# Patient Record
Sex: Female | Born: 1955 | Race: White | Hispanic: No | Marital: Single | Smoking: Never smoker
Health system: Southern US, Community
[De-identification: ages and names within clinical notes are randomized; demographics above are authoritative.]

## PROBLEM LIST (undated history)

## (undated) DIAGNOSIS — D649 Anemia, unspecified: Secondary | ICD-10-CM

## (undated) DIAGNOSIS — R7303 Prediabetes: Secondary | ICD-10-CM

## (undated) DIAGNOSIS — F39 Unspecified mood [affective] disorder: Secondary | ICD-10-CM

## (undated) DIAGNOSIS — Z85820 Personal history of malignant melanoma of skin: Secondary | ICD-10-CM

## (undated) DIAGNOSIS — N912 Amenorrhea, unspecified: Secondary | ICD-10-CM

## (undated) DIAGNOSIS — Z9889 Other specified postprocedural states: Secondary | ICD-10-CM

## (undated) DIAGNOSIS — T781XXA Other adverse food reactions, not elsewhere classified, initial encounter: Secondary | ICD-10-CM

## (undated) DIAGNOSIS — T7819XA Other adverse food reactions, not elsewhere classified, initial encounter: Secondary | ICD-10-CM

## (undated) DIAGNOSIS — H269 Unspecified cataract: Secondary | ICD-10-CM

## (undated) DIAGNOSIS — F32A Depression, unspecified: Secondary | ICD-10-CM

## (undated) DIAGNOSIS — F329 Major depressive disorder, single episode, unspecified: Secondary | ICD-10-CM

## (undated) DIAGNOSIS — R112 Nausea with vomiting, unspecified: Secondary | ICD-10-CM

## (undated) DIAGNOSIS — M1990 Unspecified osteoarthritis, unspecified site: Secondary | ICD-10-CM

## (undated) DIAGNOSIS — N63 Unspecified lump in unspecified breast: Secondary | ICD-10-CM

## (undated) DIAGNOSIS — C801 Malignant (primary) neoplasm, unspecified: Secondary | ICD-10-CM

## (undated) HISTORY — DX: Other adverse food reactions, not elsewhere classified, initial encounter: T78.1XXA

## (undated) HISTORY — PX: COLONOSCOPY: SHX174

## (undated) HISTORY — PX: OVARIAN CYST REMOVAL: SHX89

## (undated) HISTORY — DX: Unspecified lump in unspecified breast: N63.0

## (undated) HISTORY — PX: BUNIONECTOMY: SHX129

## (undated) HISTORY — PX: EYE SURGERY: SHX253

## (undated) HISTORY — PX: CERVICAL SPINE SURGERY: SHX589

## (undated) HISTORY — DX: Amenorrhea, unspecified: N91.2

## (undated) HISTORY — DX: Personal history of malignant melanoma of skin: Z85.820

## (undated) HISTORY — PX: BLEPHAROPLASTY: SUR158

## (undated) HISTORY — DX: Unspecified cataract: H26.9

## (undated) HISTORY — DX: Other adverse food reactions, not elsewhere classified, initial encounter: T78.19XA

## (undated) HISTORY — DX: Unspecified mood (affective) disorder: F39

## (undated) HISTORY — PX: TUMOR REMOVAL: SHX12

## (undated) HISTORY — DX: Anemia, unspecified: D64.9

---

## 1998-02-19 ENCOUNTER — Other Ambulatory Visit: Admission: RE | Admit: 1998-02-19 | Discharge: 1998-02-19 | Payer: Self-pay | Admitting: Obstetrics & Gynecology

## 1999-01-30 ENCOUNTER — Other Ambulatory Visit: Admission: RE | Admit: 1999-01-30 | Discharge: 1999-01-30 | Payer: Self-pay | Admitting: Obstetrics & Gynecology

## 2000-02-25 ENCOUNTER — Other Ambulatory Visit: Admission: RE | Admit: 2000-02-25 | Discharge: 2000-02-25 | Payer: Self-pay | Admitting: Obstetrics & Gynecology

## 2001-02-26 ENCOUNTER — Other Ambulatory Visit: Admission: RE | Admit: 2001-02-26 | Discharge: 2001-02-26 | Payer: Self-pay | Admitting: Obstetrics & Gynecology

## 2002-03-16 ENCOUNTER — Other Ambulatory Visit: Admission: RE | Admit: 2002-03-16 | Discharge: 2002-03-16 | Payer: Self-pay | Admitting: Obstetrics & Gynecology

## 2003-03-03 ENCOUNTER — Other Ambulatory Visit: Admission: RE | Admit: 2003-03-03 | Discharge: 2003-03-03 | Payer: Self-pay | Admitting: Obstetrics and Gynecology

## 2004-07-10 ENCOUNTER — Other Ambulatory Visit: Admission: RE | Admit: 2004-07-10 | Discharge: 2004-07-10 | Payer: Self-pay | Admitting: *Deleted

## 2004-07-15 ENCOUNTER — Ambulatory Visit: Payer: Self-pay | Admitting: Internal Medicine

## 2004-07-31 ENCOUNTER — Ambulatory Visit: Payer: Self-pay | Admitting: Internal Medicine

## 2005-01-09 ENCOUNTER — Emergency Department (HOSPITAL_COMMUNITY): Admission: EM | Admit: 2005-01-09 | Discharge: 2005-01-10 | Payer: Self-pay | Admitting: Emergency Medicine

## 2005-05-20 ENCOUNTER — Ambulatory Visit: Payer: Self-pay | Admitting: Internal Medicine

## 2005-09-30 ENCOUNTER — Ambulatory Visit: Payer: Self-pay | Admitting: Internal Medicine

## 2005-10-09 ENCOUNTER — Other Ambulatory Visit: Admission: RE | Admit: 2005-10-09 | Discharge: 2005-10-09 | Payer: Self-pay | Admitting: Obstetrics & Gynecology

## 2006-02-10 ENCOUNTER — Ambulatory Visit: Payer: Self-pay | Admitting: Internal Medicine

## 2006-02-17 ENCOUNTER — Ambulatory Visit: Payer: Self-pay | Admitting: Internal Medicine

## 2006-04-28 ENCOUNTER — Ambulatory Visit: Payer: Self-pay | Admitting: Internal Medicine

## 2006-05-18 ENCOUNTER — Ambulatory Visit: Payer: Self-pay | Admitting: Gastroenterology

## 2006-09-16 ENCOUNTER — Ambulatory Visit: Payer: Self-pay | Admitting: Internal Medicine

## 2006-09-26 DIAGNOSIS — N63 Unspecified lump in unspecified breast: Secondary | ICD-10-CM

## 2006-09-26 HISTORY — DX: Unspecified lump in unspecified breast: N63.0

## 2006-10-26 ENCOUNTER — Other Ambulatory Visit: Admission: RE | Admit: 2006-10-26 | Discharge: 2006-10-26 | Payer: Self-pay | Admitting: Obstetrics and Gynecology

## 2007-04-26 ENCOUNTER — Ambulatory Visit: Payer: Self-pay | Admitting: Internal Medicine

## 2007-04-26 DIAGNOSIS — M549 Dorsalgia, unspecified: Secondary | ICD-10-CM | POA: Insufficient documentation

## 2007-10-13 ENCOUNTER — Ambulatory Visit: Payer: Self-pay | Admitting: Internal Medicine

## 2007-10-13 DIAGNOSIS — B009 Herpesviral infection, unspecified: Secondary | ICD-10-CM | POA: Insufficient documentation

## 2007-10-13 DIAGNOSIS — G4700 Insomnia, unspecified: Secondary | ICD-10-CM | POA: Insufficient documentation

## 2007-11-02 ENCOUNTER — Telehealth: Payer: Self-pay | Admitting: *Deleted

## 2007-11-08 ENCOUNTER — Other Ambulatory Visit: Admission: RE | Admit: 2007-11-08 | Discharge: 2007-11-08 | Payer: Self-pay | Admitting: Obstetrics & Gynecology

## 2008-07-28 LAB — HM COLONOSCOPY: HM Colonoscopy: NORMAL

## 2008-09-01 ENCOUNTER — Encounter: Payer: Self-pay | Admitting: Internal Medicine

## 2008-09-08 ENCOUNTER — Ambulatory Visit: Payer: Self-pay | Admitting: Internal Medicine

## 2008-11-09 ENCOUNTER — Other Ambulatory Visit: Admission: RE | Admit: 2008-11-09 | Discharge: 2008-11-09 | Payer: Self-pay | Admitting: Obstetrics & Gynecology

## 2009-01-19 ENCOUNTER — Ambulatory Visit: Payer: Self-pay | Admitting: Internal Medicine

## 2009-01-19 LAB — CONVERTED CEMR LAB
ALT: 12 units/L (ref 0–35)
AST: 20 units/L (ref 0–37)
Alkaline Phosphatase: 42 units/L (ref 39–117)
BUN: 15 mg/dL (ref 6–23)
Bilirubin Urine: NEGATIVE
Bilirubin, Direct: 0.1 mg/dL (ref 0.0–0.3)
CO2: 29 meq/L (ref 19–32)
Calcium: 8.9 mg/dL (ref 8.4–10.5)
Creatinine, Ser: 0.9 mg/dL (ref 0.4–1.2)
Direct LDL: 134.2 mg/dL
Eosinophils Absolute: 0.1 10*3/uL (ref 0.0–0.7)
Glucose, Bld: 79 mg/dL (ref 70–99)
HCT: 40.6 % (ref 36.0–46.0)
Lymphs Abs: 2.1 10*3/uL (ref 0.7–4.0)
MCHC: 33.9 g/dL (ref 30.0–36.0)
MCV: 96.1 fL (ref 78.0–100.0)
Monocytes Absolute: 0.6 10*3/uL (ref 0.1–1.0)
Neutrophils Relative %: 49 % (ref 43.0–77.0)
Platelets: 211 10*3/uL (ref 150.0–400.0)
TSH: 1.03 microintl units/mL (ref 0.35–5.50)
Total Bilirubin: 0.9 mg/dL (ref 0.3–1.2)
Total CHOL/HDL Ratio: 3
Triglycerides: 63 mg/dL (ref 0.0–149.0)
Urobilinogen, UA: 0.2
WBC Urine, dipstick: NEGATIVE

## 2009-02-02 ENCOUNTER — Ambulatory Visit: Payer: Self-pay | Admitting: Internal Medicine

## 2009-02-02 LAB — CONVERTED CEMR LAB
Blood in Urine, dipstick: NEGATIVE
Glucose, Urine, Semiquant: NEGATIVE
Nitrite: NEGATIVE
Protein, U semiquant: NEGATIVE
Specific Gravity, Urine: 1.01
WBC Urine, dipstick: NEGATIVE
pH: 6

## 2009-03-14 ENCOUNTER — Ambulatory Visit: Payer: Self-pay | Admitting: Internal Medicine

## 2009-07-28 DIAGNOSIS — Z85820 Personal history of malignant melanoma of skin: Secondary | ICD-10-CM

## 2009-07-28 HISTORY — DX: Personal history of malignant melanoma of skin: Z85.820

## 2009-09-18 ENCOUNTER — Telehealth: Payer: Self-pay | Admitting: Internal Medicine

## 2009-11-06 ENCOUNTER — Telehealth: Payer: Self-pay | Admitting: *Deleted

## 2009-12-03 ENCOUNTER — Telehealth: Payer: Self-pay | Admitting: *Deleted

## 2009-12-21 ENCOUNTER — Ambulatory Visit: Payer: Self-pay | Admitting: Internal Medicine

## 2009-12-21 DIAGNOSIS — N959 Unspecified menopausal and perimenopausal disorder: Secondary | ICD-10-CM | POA: Insufficient documentation

## 2009-12-21 DIAGNOSIS — M79609 Pain in unspecified limb: Secondary | ICD-10-CM | POA: Insufficient documentation

## 2010-07-28 HISTORY — PX: MELANOMA EXCISION: SHX5266

## 2010-08-27 NOTE — Assessment & Plan Note (Signed)
Summary: follow on meds/ssc   Vital Signs:  Patient profile:   55 year old female Menstrual status:  regular Weight:      118 pounds BMI:     19.71 Temp:     98.8 degrees F oral Pulse rate:   74 / minute Resp:     12 per minute BP sitting:   110 / 80  Vitals Entered By: Lynann Beaver CMA (Dec 21, 2009 3:26 PM) CC: follow-up visit Is Patient Diabetic? No Pain Assessment Patient in pain? no        History of Present Illness: Kendra Hoffman  comesin for med check .    Last ov was august  2010 for dizziness.   Sleep  :  taking a 1/2  of ambien ususally  and then 1030  - 4 30  .    no sig se of med  would like not to take meds .but cant sleep without this and is now menopausal  Limited  time   off ocps   Increasing hand pain and swelling.    thumbs     are painful   at time  left  more than right.    considering seeing  rheumatologist.  She had seen Dr D in the remote past. Hasnt taken nsaid cause doesnt like to take meds!      no other joint problems   Preventive Screening-Counseling & Management  Alcohol-Tobacco     Alcohol drinks/day: <1     Alcohol type: wine     Smoking Status: never  Caffeine-Diet-Exercise     Caffeine use/day: 1     Does Patient Exercise: yes  Allergies: 1)  Sulfamethoxazole (Sulfamethoxazole)  Past History:  Past medical, surgical, family and social histories (including risk factors) reviewed for relevance to current acute and chronic problems.  Past Medical History: Reviewed history from 02/02/2009 and no changes required. Mood dysfuncton Anemia G1 Consults Dr. Gwendolyn Fill   April  Past Surgical History: Reviewed history from 02/02/2009 and no changes required. ovarian cyst removal bunion surgery right  Family History: Reviewed history from 02/02/2009 and no changes required. Family History of Alcoholism/Addiction Family History Diabetes 1st degree relative Family History Lung cancer Family History Other cancer-Colon Family  History of Cardiovascular disorder Family History of Arthritis  Father: 2 MIs   and DM Mother: macualr degeneration otherwise healthy  Siblings: Bro alcohol, sis  etoh,   sis  FM    brother  DVT  disabled and immobile  Nephew  28 had blood clot.   Social History: Reviewed history from 02/02/2009 and no changes required. Single Alcohol use-yes ocassional max 3 per week. Regular exercise-no HH of 1  pets 2 cats  Non smoker   Review of Systems  The patient denies anorexia, fever, weight loss, weight gain, vision loss, decreased hearing, prolonged cough, difficulty walking, unusual weight change, abnormal bleeding, enlarged lymph nodes, and angioedema.    Physical Exam  General:  Well-developed,well-nourished,in no acute distress; alert,appropriate and cooperative throughout examination Head:  normocephalic and atraumatic.   Eyes:  vision grossly intact, pupils equal, and pupils round.   Neck:  No deformities, masses, or tenderness noted. Lungs:  Normal respiratory effort, chest expands symmetrically. Lungs are clear to auscultation, no crackles or wheezes. Heart:  Normal rate and regular rhythm. S1 and S2 normal without gallop, murmur, click, rub or other extra sounds.no lifts.   Abdomen:  Bowel sounds positive,abdomen soft and non-tender without masses, organomegaly or  noted. Msk:  hand  with slight swelling    no crepitius or redness  of thumb  Pulses:  pulses intact without delay   Extremities:  no clubbing cyanosis or edema  Neurologic:  non focal  Skin:  turgor normal, color normal, no petechiae, and no purpura.   Cervical Nodes:  No lymphadenopathy noted Psych:  Oriented X3, good eye contact, not anxious appearing, and not depressed appearing.   loks a bit tired   Impression & Recommendations:  Problem # 1:  INSOMNIA (ICD-780.52) Discussed risk benefit    consider  CBT at some point to make her awar but ok to continue for now on med. Her updated medication list for  this problem includes:    Ambien 10 Mg Tabs (Zolpidem tartrate) .Marland Kitchen... 1/2 to 1 by mouth at bedtime.hs  Problem # 2:  PERIMENOPAUSAL SYNDROME (ICD-627.9) to see Dr Hyacinth Meeker soon   Problem # 3:  HAND PAIN, BILATERAL (ICD-729.5) Assessment: Deteriorated ? oa ? other   agree with  consult  but would try nsaid also...  Complete Medication List: 1)  Ambien 10 Mg Tabs (Zolpidem tartrate) .... 1/2 to 1 by mouth at bedtime.hs 2)  Valtrex 500 Mg Tabs (Valacyclovir hcl) .Marland Kitchen.. 1 by mouth two times a day as directed 3)  Fish Oil 1000 Mg Caps (Omega-3 fatty acids) .... One by mouth daily 4)  Vitamin D 400 Unit Caps (Cholecalciferol) .... One by mouth daily  Patient Instructions: 1)  consider    cognitive behavioral therapy   for sleep in the future . 2)  would consider taking  nsaid   for the thumb pain   before  seeing rheumatolgist . Prescriptions: AMBIEN 10 MG  TABS (ZOLPIDEM TARTRATE) 1/2 to 1 by mouth at bedtime.hs  #90 x 1   Entered and Authorized by:   Madelin Headings MD   Signed by:   Madelin Headings MD on 12/21/2009   Method used:   Print then Give to Patient   RxID:   [id number]  greater than 50% of visit spent in counseling  25 minutes

## 2010-08-27 NOTE — Progress Notes (Signed)
Summary: ? about refill  Phone Note Call from Patient Call back at Home Phone [phone]   Caller: Patient---live call Summary of Call: according to Ira Dougher, she stated that she had a refill for Zoldipem. According to [facility], she does not. Pt would like for [facility] to call them . Ref # [id number]. Initial call taken by: Warnell Forester,  Dec 03, 2009 1:02 PM  Follow-up for Phone Call        Spoke to pt and she schedule an appt for 5/27. She would like to have a 30 days supply called into [facility]. See phone note from last month. Follow-up by: Romualdo Bolk, CMA (AAMA),  Dec 03, 2009 2:42 PM    Prescriptions: AMBIEN 10 MG  TABS (ZOLPIDEM TARTRATE) 1/2 to 1 by mouth at bedtime.hs  #30 x 0   Entered by:   Romualdo Bolk, CMA (AAMA)   Authorized by:   Madelin Headings MD   Signed by:   Romualdo Bolk, CMA (AAMA) on 12/03/2009   Method used:   Telephoned to ...       [facility]* (retail)       [address] [place], [place]  [address]       Ph: [phone]       Fax: [phone]   RxID:   [id number]

## 2010-08-27 NOTE — Progress Notes (Signed)
Summary: REFILL REQ FOR MED  Phone Note Call from Patient   Caller: Patient [phone] Reason for Call: Refill Medication Summary of Call: Pt req refill RX for med: AMBIEN 10 MG  TABS (ZOLPIDEM TARTRATE) to be faxed to [facility] @ [phone]...Marland KitchenMarland KitchenMarland Kitchen Pt adv that she called [facility] and they instructed her to call her Physician because they are waiting on a fax from same so prescription can be filled.   Initial call taken by: Debbra Riding,  September 18, 2009 9:29 AM  Follow-up for Phone Call        ok 90 no refills Follow-up by: Madelin Headings MD,  September 18, 2009 12:24 PM    Prescriptions: AMBIEN 10 MG  TABS (ZOLPIDEM TARTRATE) 1/2 to 1 by mouth at bedtime.hs  #90 x 0   Entered by:   Raechel Ache, RN   Authorized by:   Madelin Headings MD   Signed by:   Raechel Ache, RN on 09/18/2009   Method used:   Print then Give to Patient   RxID:   [id number]  faxed to [facility].

## 2010-08-27 NOTE — Progress Notes (Signed)
Summary: ?ov  Phone Note Call from Patient Call back at Home Phone [phone]   Caller: Patient Call For: Madelin Headings MD Summary of Call: pt has one refill remaining on ambien does pt needs ov ? Initial call taken by: Heron Sabins,  November 06, 2009 12:35 PM  Follow-up for Phone Call        its been more than 6 months since last visit .  needs ROV  . can refill med #30  and ov before runs out. of med. Follow-up by: Madelin Headings MD,  November 06, 2009 5:20 PM  Additional Follow-up for Phone Call Additional follow up Details #1::        Left message on machine to call back to schedule a follow up appt. Rx wasn't called in. I left a message saying that if she does need a refill then she needs to call our office to let us know and we could refill until her appt. Additional Follow-up by: Romualdo Bolk, CMA (AAMA),  November 06, 2009 5:22 PM

## 2010-10-17 ENCOUNTER — Telehealth: Payer: Self-pay | Admitting: Internal Medicine

## 2010-10-17 NOTE — Telephone Encounter (Signed)
Pt is sch for med check appt on 11/25/10. Pt would like to get lab work done during this visit. Pt will do an 8 hr fast. Pls advise.

## 2010-10-18 NOTE — Telephone Encounter (Signed)
Pt needs cpx with labs. Pt aware and will come in fasting 8 hours prior to appt. Appt scheduled.

## 2010-11-07 ENCOUNTER — Encounter: Payer: Self-pay | Admitting: Internal Medicine

## 2010-11-18 ENCOUNTER — Ambulatory Visit (INDEPENDENT_AMBULATORY_CARE_PROVIDER_SITE_OTHER): Payer: BC Managed Care – PPO | Admitting: Internal Medicine

## 2010-11-18 ENCOUNTER — Encounter: Payer: Self-pay | Admitting: Internal Medicine

## 2010-11-18 VITALS — BP 100/60 | HR 78 | Ht 64.0 in | Wt 118.0 lb

## 2010-11-18 DIAGNOSIS — Z85820 Personal history of malignant melanoma of skin: Secondary | ICD-10-CM

## 2010-11-18 DIAGNOSIS — G4700 Insomnia, unspecified: Secondary | ICD-10-CM

## 2010-11-18 DIAGNOSIS — B009 Herpesviral infection, unspecified: Secondary | ICD-10-CM

## 2010-11-18 DIAGNOSIS — Z0110 Encounter for examination of ears and hearing without abnormal findings: Secondary | ICD-10-CM

## 2010-11-18 DIAGNOSIS — N959 Unspecified menopausal and perimenopausal disorder: Secondary | ICD-10-CM

## 2010-11-18 DIAGNOSIS — Z0000 Encounter for general adult medical examination without abnormal findings: Secondary | ICD-10-CM | POA: Insufficient documentation

## 2010-11-18 DIAGNOSIS — Z23 Encounter for immunization: Secondary | ICD-10-CM

## 2010-11-18 LAB — CBC WITH DIFFERENTIAL/PLATELET
Basophils Absolute: 0 10*3/uL (ref 0.0–0.1)
Basophils Relative: 0.1 % (ref 0.0–3.0)
Eosinophils Relative: 0.9 % (ref 0.0–5.0)
Hemoglobin: 13.5 g/dL (ref 12.0–15.0)
Lymphocytes Relative: 37.3 % (ref 12.0–46.0)
Monocytes Relative: 9 % (ref 3.0–12.0)
Neutro Abs: 3.3 10*3/uL (ref 1.4–7.7)
RBC: 3.99 Mil/uL (ref 3.87–5.11)
WBC: 6.2 10*3/uL (ref 4.5–10.5)

## 2010-11-18 LAB — BASIC METABOLIC PANEL
Chloride: 98 mEq/L (ref 96–112)
GFR: 67.3 mL/min (ref >60.00)
Potassium: 3.7 mEq/L (ref 3.5–5.1)
Sodium: 139 mEq/L (ref 135–145)

## 2010-11-18 LAB — HEPATIC FUNCTION PANEL
ALT: 12 U/L (ref 0–35)
AST: 19 U/L (ref 0–37)
Alkaline Phosphatase: 53 U/L (ref 39–117)
Total Bilirubin: 0.7 mg/dL (ref 0.3–1.2)

## 2010-11-18 LAB — LIPID PANEL
LDL Cholesterol: 96 mg/dL (ref 0–99)
VLDL: 6 mg/dL (ref 0.0–40.0)

## 2010-11-18 LAB — POCT URINALYSIS DIPSTICK
Leukocytes, UA: NEGATIVE
Nitrite, UA: NEGATIVE
Protein, UA: NEGATIVE

## 2010-11-18 LAB — TSH: TSH: 0.95 u[IU]/mL (ref 0.35–5.50)

## 2010-11-18 MED ORDER — ZOLPIDEM TARTRATE 10 MG PO TABS: 5.0000 mg | ORAL_TABLET | Freq: Every evening | ORAL | Status: DC | PRN

## 2010-11-18 MED ORDER — VALACYCLOVIR HCL 500 MG PO TABS: 500.0000 mg | ORAL_TABLET | Freq: Two times a day (BID) | ORAL | Status: DC

## 2010-11-18 NOTE — Patient Instructions (Signed)
Will notify you  of labs when available. Check up in   1 year  or as needed

## 2010-11-18 NOTE — Progress Notes (Signed)
  Subjective:    Patient ID: Kendra Hoffman, female    DOB: [birth year], 55 y.o.   MRN: [id number]  HPI Patient comes in for her yearly checkup. Also her medicine evaluation. 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 HRT : Helps back on pill for  Cost. Helps. Dr Thurnell Lose  No major injuries or hospitalizations.  Review of Systems Sleep  With ambien.. 7  Hours    hard to sleep without it Snore some no apnea some allergies mild  Moderate  And no wheezing. Otherwise ROS negative or noncontributory she has some chronic right breast lateral tenderness for years. Negative evaluations. It is a bit concerned about her hearing thinks it's down a little bit but no acute changes. Uses Valtrex as needed not very often for herpes.    Objective:   Physical Exam Physical Exam: Vital signs reviewed BJY:NWGN is a well-developed well-nourished alert cooperative  white female who appears her stated age in no acute distress.  HEENT: normocephalic  traumatic , Eyes: PERRL EOM's full, conjunctiva clear, Nares: paten,t no deformity discharge or tenderness., Ears: no deformity EAC's clear TMs with normal landmarks. Mouth: clear OP, no lesions, edema.  Moist mucous membranes. Dentition in adequate repair. NECK: supple without masses, thyromegaly or bruits. CHEST/PULM:  Clear to auscultation and percussion breath sounds equal no wheeze , rales or rhonchi. No chest wall deformities or tenderness. Breast: normal by inspection . No dimpling, discharge, masses, tenderness or discharge . Breasts are lumpy LN: no cervical axillary inguinal adenopathy CV: PMI is nondisplaced, S1 S2 no gallops, murmurs, rubs. Peripheral pulses are full without delay.No JVD .  ABDOMEN: Bowel sounds normal nontender  No guard or rebound, no hepato splenomegal no CVA tenderness.  No hernia. Extremtities:  No clubbing cyanosis or edema, no acute joint swelling  or redness no focal atrophy NEURO:  Oriented x3, cranial nerves 3-12 appear to be intact, no obvious focal weakness,gait within normal limits no abnormal reflexes or asymmetrical SKIN: No acute rashes normal turgor, color, no bruising or petechiae. Well-healed scar left lateral foot. Some changes throughout PSYCH: Oriented, good eye contact, no obvious depression anxiety, cognition and judgment appear normal. Oriented x 3 and no noted deficits in memory, attention, and speech.  GYNE: sees Dr Hyacinth Meeker.  Hearing screen   Some decrease 35 db  Right  in lower frequencies   Left lower frequencies.        Assessment & Plan:  Preventive Health Care Tdap given today we'll get labs today though she is not fasting. She is not high risk.  Hearing  Borderline  Should get full audiologic eval if  Problematic.  Insomnia  On going  Risk benefit of medication discussed. Perimenopause  On hrt HSV prn rare usage. Refill  Hx of melanoma

## 2010-11-20 ENCOUNTER — Encounter: Payer: Self-pay | Admitting: *Deleted

## 2010-11-25 ENCOUNTER — Ambulatory Visit: Payer: Self-pay | Admitting: Internal Medicine

## 2011-05-19 ENCOUNTER — Emergency Department (INDEPENDENT_AMBULATORY_CARE_PROVIDER_SITE_OTHER): Payer: BC Managed Care – PPO

## 2011-05-19 ENCOUNTER — Emergency Department (HOSPITAL_BASED_OUTPATIENT_CLINIC_OR_DEPARTMENT_OTHER)
Admission: EM | Admit: 2011-05-19 | Discharge: 2011-05-19 | Disposition: A | Discharge status: Home / Self Care | Payer: BC Managed Care – PPO | Attending: Emergency Medicine | Admitting: Emergency Medicine

## 2011-05-19 ENCOUNTER — Encounter (HOSPITAL_BASED_OUTPATIENT_CLINIC_OR_DEPARTMENT_OTHER): Payer: Self-pay | Admitting: *Deleted

## 2011-05-19 DIAGNOSIS — S62639A Displaced fracture of distal phalanx of unspecified finger, initial encounter for closed fracture: Secondary | ICD-10-CM | POA: Insufficient documentation

## 2011-05-19 DIAGNOSIS — Z79899 Other long term (current) drug therapy: Secondary | ICD-10-CM | POA: Insufficient documentation

## 2011-05-19 DIAGNOSIS — W268XXA Contact with other sharp object(s), not elsewhere classified, initial encounter: Secondary | ICD-10-CM | POA: Insufficient documentation

## 2011-05-19 DIAGNOSIS — F17200 Nicotine dependence, unspecified, uncomplicated: Secondary | ICD-10-CM | POA: Insufficient documentation

## 2011-05-19 DIAGNOSIS — M25549 Pain in joints of unspecified hand: Secondary | ICD-10-CM

## 2011-05-19 DIAGNOSIS — IMO0002 Reserved for concepts with insufficient information to code with codable children: Secondary | ICD-10-CM

## 2011-05-19 DIAGNOSIS — X58XXXA Exposure to other specified factors, initial encounter: Secondary | ICD-10-CM

## 2011-05-19 DIAGNOSIS — S61209A Unspecified open wound of unspecified finger without damage to nail, initial encounter: Secondary | ICD-10-CM | POA: Insufficient documentation

## 2011-05-19 MED ORDER — OXYCODONE-ACETAMINOPHEN 5-325 MG PO TABS: 1.0000 | ORAL_TABLET | ORAL | Status: AC | PRN

## 2011-05-19 MED ORDER — OXYCODONE-ACETAMINOPHEN 5-325 MG PO TABS
ORAL_TABLET | ORAL | Status: AC
  Administered 2011-05-19: 1 via ORAL
  Filled 2011-05-19: qty 1

## 2011-05-19 MED ORDER — OXYCODONE-ACETAMINOPHEN 5-325 MG PO TABS
1.0000 | ORAL_TABLET | Freq: Once | ORAL | Status: AC
  Administered 2011-05-19: 1 via ORAL

## 2011-05-19 MED ORDER — BUPIVACAINE HCL 0.5 % IJ SOLN
50.0000 mL | Freq: Once | INTRAMUSCULAR | Status: AC
  Administered 2011-05-19: 3 mL
  Filled 2011-05-19: qty 1

## 2011-05-19 MED ORDER — CEPHALEXIN 500 MG PO CAPS: 500.0000 mg | ORAL_CAPSULE | Freq: Four times a day (QID) | ORAL | Status: AC

## 2011-05-19 NOTE — ED Provider Notes (Addendum)
History     CSN: [id number] Arrival date & time: 05/19/2011  2:04 PM   First MD Initiated Contact with Patient 05/19/11 1406      Chief Complaint  Patient presents with  . Extremity Laceration    (Consider location/radiation/quality/duration/timing/severity/associated sxs/prior treatment) Patient is a 55 y.o. female presenting with skin laceration. The history is provided by the patient.  Laceration  The incident occurred less than 1 hour ago. The laceration is located on the right hand (Right 5th finger). The laceration is 1 cm in size. The laceration mechanism was a a metal edge. The pain is at a severity of 8/10. The pain is moderate. The pain has been constant since onset. She reports no foreign bodies present. Her tetanus status is UTD.    Past Medical History  Diagnosis Date  . Mood disorder   . Anemia   . Hx of melanoma of skin 2011    left foot     Past Surgical History  Procedure Date  . Ovarian cyst removal   . Bunionectomy     right  . Melanoma excision 2012    left foot     Family History  Problem Relation Age of Onset  . Macular degeneration Mother   . Hypertension Mother   . Diabetes Father   . Heart attack Father     x 2  . Heart disease Father   . Alcohol abuse Sister   . Alcohol abuse Brother   . Deep vein thrombosis Brother     disable and immoble also nephew  . Ovarian cancer Sister   . Lung cancer    . Colon cancer      History  Substance Use Topics  . Smoking status: Current Some Day Smoker  . Smokeless tobacco: Not on file   Comment: 3 times a year  . Alcohol Use: 1.8 oz/week    3 Glasses of wine per week    OB History    Grav Para Term Preterm Abortions TAB SAB Ect Mult Living                  Review of Systems  Constitutional: Negative.   Skin:       Laceration rt 5th finger    Allergies  Sulfamethoxazole  Home Medications   Current Outpatient Rx  Name Route Sig Dispense Refill  . CITALOPRAM HYDROBROMIDE 20 MG  PO TABS Oral Take 20 mg by mouth daily.      Marland Kitchen ESTRADIOL 0.05 MG/24HR TD PTTW Transdermal Place 1 patch onto the skin 2 (two) times a week. Replace on Sunday and Wednesday     . OMEGA-3 FATTY ACIDS 1000 MG PO CAPS Oral Take 2 g by mouth daily.      Marland Kitchen MEDROXYPROGESTERONE ACETATE 5 MG PO TABS Oral Take 5 mg by mouth daily.      Marland Kitchen ZOLPIDEM TARTRATE 10 MG PO TABS Oral Take 5 mg by mouth at bedtime as needed.      Marland Kitchen VITAMIN D 400 UNITS PO CAPS Oral Take 400 Units by mouth daily.      . NON FORMULARY  Estrogen/progesterone pills- Pt unsure of name     . VALACYCLOVIR HCL 500 MG PO TABS Oral Take 1 tablet (500 mg total) by mouth 2 (two) times daily. 60 tablet 1    BP 128/80  Pulse 68  Temp 97.2 F (36.2 C)  Resp 16  Ht 5\' 5"  (1.651 m)  Wt 120 lb (54.432 kg)  BMI 19.97 kg/m2  SpO2 100%  Physical Exam  Constitutional: She appears well-developed and well-nourished.  Skin:       Tip of the 5th right finger with multiple lacerations, macerated in appearance. Lateral fingernail laceration where nail is in place, bilateral lateral aspects. Nail cuticle intact. No bony abnormalities.     ED Course  LACERATION REPAIR Date/Time: 05/19/2011 4:00 PM Performed by: Langley Adie A Authorized by: Langley Adie A Consent: Verbal consent obtained. Consent given by: patient and spouse Patient understanding: patient states understanding of the procedure being performed Patient identity confirmed: verbally with patient Location: Right index finger. Laceration length: 1 cm Tendon involvement: none Nerve involvement: none Anesthesia: digital block Local anesthetic: bupivacaine 0.5% without epinephrine Patient sedated: no Preparation: Patient was prepped and draped in the usual sterile fashion. Irrigation solution: saline Irrigation method: syringe Amount of cleaning: standard Skin closure: 5-0 Prolene Number of sutures: 3 Technique: simple Approximation difficulty: complex Patient tolerance:  Patient tolerated the procedure well with no immediate complications.   (including critical care time)  Labs Reviewed - No data to display Dg Finger Little Right  05/19/2011  *RADIOLOGY REPORT*  Clinical Data: Laceration, pain  RIGHT LITTLE FINGER 2+V  Comparison: None.  Findings: There is a small cortical fracture along the medial aspect of the mid portion of the fifth distal phalanx.  There is overlying soft tissue irregularity.  IMPRESSION: Small nondisplaced cortical fracture at the fifth distal phalanx.  Original Report Authenticated By: Brandon Melnick, M.D.     No diagnosis found.    MDM          Rodena Medin, PA 05/19/11 1641  Rodena Medin, PA 06/02/11 4098  Rodena Medin, PA 06/11/11 1191

## 2011-05-19 NOTE — ED Provider Notes (Signed)
Medical screening examination/treatment/procedure(s) were performed by non-physician practitioner and as supervising physician I was immediately available for consultation/collaboration.   Zuriyah Shatz, MD 05/19/11 1759 

## 2011-05-19 NOTE — ED Notes (Signed)
Pt c/o laceration to right hand 5th finger x 15 mins ago with hedge trimmers

## 2011-06-03 NOTE — ED Provider Notes (Signed)
Medical screening examination/treatment/procedure(s) were performed by non-physician practitioner and as supervising physician I was immediately available for consultation/collaboration.  Lita Flynn, MD 06/03/11 1537 

## 2011-06-11 NOTE — ED Provider Notes (Signed)
Medical screening examination/treatment/procedure(s) were performed by non-physician practitioner and as supervising physician I was immediately available for consultation/collaboration.  Geoffery Lyons, MD 06/11/11 0700

## 2011-07-30 ENCOUNTER — Telehealth: Payer: Self-pay | Admitting: *Deleted

## 2011-07-30 NOTE — Telephone Encounter (Signed)
Zolpidem 10mg  pt takes 1/2 tab qhs prn  LOV 11/18/10 NOV none

## 2011-07-30 NOTE — Telephone Encounter (Signed)
Ok to refill x 1  

## 2011-07-31 MED ORDER — ZOLPIDEM TARTRATE 10 MG PO TABS: 5.0000 mg | ORAL_TABLET | Freq: Every evening | ORAL | Status: DC | PRN

## 2011-07-31 NOTE — Telephone Encounter (Signed)
Rx called in 

## 2011-10-08 ENCOUNTER — Telehealth: Payer: Self-pay | Admitting: *Deleted

## 2011-10-08 MED ORDER — ZOLPIDEM TARTRATE 10 MG PO TABS: 5.0000 mg | ORAL_TABLET | Freq: Every evening | ORAL | Status: DC | PRN

## 2011-10-08 NOTE — Telephone Encounter (Signed)
Refill on ambien 10mg  Last refill on 07/31/11 #30 LOV 11/18/10 NOV none

## 2011-10-08 NOTE — Telephone Encounter (Signed)
Rx called in 

## 2011-10-08 NOTE — Telephone Encounter (Signed)
Ok x 1  Needs ov before more refills if it has been a year.

## 2011-11-07 ENCOUNTER — Other Ambulatory Visit: Payer: Self-pay

## 2011-11-07 NOTE — Telephone Encounter (Signed)
Rx request for zolpidem 10 mg.  Rx last filled 10/08/11. Pt last seen 11/18/10.  Pls advise.

## 2011-11-07 NOTE — Telephone Encounter (Signed)
Due for yearly check.   Can refill 30 pills  This time .     Schedule OV before runs out of meds.

## 2011-11-10 MED ORDER — ZOLPIDEM TARTRATE 10 MG PO TABS: 5.0000 mg | ORAL_TABLET | Freq: Every evening | ORAL | Status: DC | PRN

## 2011-11-10 NOTE — Telephone Encounter (Signed)
Rx called in to pharmacy. 

## 2012-01-01 ENCOUNTER — Telehealth: Payer: Self-pay | Admitting: Internal Medicine

## 2012-01-01 NOTE — Telephone Encounter (Signed)
Called and spoke with pt and pt states this started 4 days ago.  Pt states it is a pink color when she wipes.  Pt states it is a mucus-like in her bowel.  Pt states it is diluted and pink on the toilet tissue.  Pt states she has a GI specialist.  Per Dr. Fabian Sharp advised pt to call her GI specialist and make aware of new symptoms.

## 2012-01-01 NOTE — Telephone Encounter (Signed)
Please contact pt. Called can wanted to drop off a stool sample because she is having blood in stool. I informed her she would need to see the doctor. Pt said she could not afford 2 office visits. Pt wanted to move physical lab up to next week. I asked pt if should would like for me to ask nurse ot we could move up her appt. Pt declined.

## 2012-01-26 ENCOUNTER — Encounter: Payer: Self-pay | Admitting: Internal Medicine

## 2012-02-04 ENCOUNTER — Other Ambulatory Visit (INDEPENDENT_AMBULATORY_CARE_PROVIDER_SITE_OTHER): Payer: BC Managed Care – PPO

## 2012-02-04 DIAGNOSIS — Z0000 Encounter for general adult medical examination without abnormal findings: Secondary | ICD-10-CM

## 2012-02-04 LAB — LDL CHOLESTEROL, DIRECT: Direct LDL: 104 mg/dL

## 2012-02-04 LAB — HEPATIC FUNCTION PANEL
ALT: 12 U/L (ref 0–35)
AST: 19 U/L (ref 0–37)
Total Bilirubin: 0.7 mg/dL (ref 0.3–1.2)

## 2012-02-04 LAB — CBC WITH DIFFERENTIAL/PLATELET
Basophils Relative: 0.1 % (ref 0.0–3.0)
Eosinophils Relative: 1.5 % (ref 0.0–5.0)
HCT: 38.3 % (ref 36.0–46.0)
Hemoglobin: 12.9 g/dL (ref 12.0–15.0)
Lymphs Abs: 2 10*3/uL (ref 0.7–4.0)
Monocytes Relative: 11.7 % (ref 3.0–12.0)
Platelets: 165 10*3/uL (ref 150.0–400.0)
RBC: 3.92 Mil/uL (ref 3.87–5.11)
WBC: 5.7 10*3/uL (ref 4.5–10.5)

## 2012-02-04 LAB — POCT URINALYSIS DIPSTICK
Glucose, UA: NEGATIVE
Nitrite, UA: NEGATIVE
Spec Grav, UA: 1.015
Urobilinogen, UA: 1

## 2012-02-04 LAB — LIPID PANEL
Total CHOL/HDL Ratio: 3
VLDL: 6.8 mg/dL (ref 0.0–40.0)

## 2012-02-04 LAB — BASIC METABOLIC PANEL
BUN: 17 mg/dL (ref 6–23)
GFR: 70.54 mL/min (ref >60.00)
Potassium: 3.9 mEq/L (ref 3.5–5.1)
Sodium: 139 mEq/L (ref 135–145)

## 2012-02-04 LAB — TSH: TSH: 1.17 u[IU]/mL (ref 0.35–5.50)

## 2012-02-05 ENCOUNTER — Telehealth: Payer: Self-pay | Admitting: Family Medicine

## 2012-02-05 NOTE — Telephone Encounter (Signed)
Last seen 11/25/10 F/U appt:  02/11/12  Please advise how many refills.  Thanks!!!

## 2012-02-05 NOTE — Telephone Encounter (Signed)
Refill x 1; 30 pills

## 2012-02-06 ENCOUNTER — Other Ambulatory Visit: Payer: Self-pay | Admitting: Family Medicine

## 2012-02-06 MED ORDER — ZOLPIDEM TARTRATE 10 MG PO TABS: 5.0000 mg | ORAL_TABLET | Freq: Every evening | ORAL | Status: DC | PRN

## 2012-02-06 NOTE — Telephone Encounter (Signed)
Left message on voicemail at [facility] to refill Ambien.

## 2012-02-11 ENCOUNTER — Encounter: Payer: Self-pay | Admitting: Internal Medicine

## 2012-02-11 ENCOUNTER — Ambulatory Visit (INDEPENDENT_AMBULATORY_CARE_PROVIDER_SITE_OTHER): Payer: BC Managed Care – PPO | Admitting: Internal Medicine

## 2012-02-11 VITALS — BP 132/74 | HR 71 | Temp 99.1°F | Ht 64.5 in | Wt 114.0 lb

## 2012-02-11 DIAGNOSIS — N959 Unspecified menopausal and perimenopausal disorder: Secondary | ICD-10-CM

## 2012-02-11 DIAGNOSIS — Z85820 Personal history of malignant melanoma of skin: Secondary | ICD-10-CM

## 2012-02-11 DIAGNOSIS — Z0000 Encounter for general adult medical examination without abnormal findings: Secondary | ICD-10-CM

## 2012-02-11 DIAGNOSIS — G4700 Insomnia, unspecified: Secondary | ICD-10-CM

## 2012-02-11 MED ORDER — ZOLPIDEM TARTRATE 10 MG PO TABS: 5.0000 mg | ORAL_TABLET | Freq: Every evening | ORAL | Status: DC | PRN

## 2012-02-11 NOTE — Progress Notes (Signed)
Subjective:    Patient ID: Kendra Hoffman, female    DOB: 18-Sep-1955, 56 y.o.   MRN: [id number]  HPI Patient comes in today for preventive visit and follow-up of medical issues. Update  history since  last visit: Her physical health has been okay although she is getting some hot flashes on hormone replacement per Dr. Hyacinth Meeker. She continues on Celexa 20 mg seems to keep things stabilize her family is going through great decline. Her his sister dying of ovarian cancer in her parents health declining. 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 They live in [place] she lives in [place] she has a lot of trouble there. She is working 2 jobs.  She uses Ambien as needed for sleep 5 mg and has been using it as needed in the past although has needed a bit more now. Has for refills sent in to the pharmacy.  Not using tobacco over 3 years in the past and had an occasional tobacco but never had any daily tobacco use. Her father did smoke and died of lung cancer.  She has a personal history of melanoma and sees the dermatologist on a routine basis for surveillance to recurrences. She is to get her Pap smear next month  Review of Systems ROS:  GEN/ HEENT: No fever, significant weight changes sweats headaches vision problems hearing changes, CV/ PULM; No chest pain shortness of breath cough, syncope,edema  change in exercise tolerance. GI /GU: No adominal pain, vomiting, change in bowel habits. No blood in the stool. No significant GU symptoms. SKIN/HEME: ,no acute skin rashes suspicious lesions or bleeding. No lymphadenopathy, nodules, masses.  NEURO/ PSYCH:  No neurologic signs such as weakness numbness. No depression anxiety. IMM/ Allergy: No unusual infections.  Allergy .  Does have occasional hot flushes. REST of 12 system review negative except as per HPI Outpatient Encounter Prescriptions as of 02/11/2012  Medication Sig Dispense Refill  . citalopram (CELEXA) 20 MG tablet Take 20  mg by mouth daily.        Marland Kitchen estradiol (VIVELLE-DOT) 0.05 MG/24HR Place 1 patch onto the skin 2 (two) times a week. Replace on Sunday and Wednesday       . fish oil-omega-3 fatty acids 1000 MG capsule Take 2 g by mouth daily.        . medroxyPROGESTERone (PROVERA) 5 MG tablet Take 5 mg by mouth daily.        . valACYclovir (VALTREX) 500 MG tablet Take 1 tablet (500 mg total) by mouth 2 (two) times daily.  60 tablet  1  . zolpidem (AMBIEN) 10 MG tablet Take 0.5 tablets (5 mg total) by mouth at bedtime as needed.  30 tablet  0  . DISCONTD: Cholecalciferol (VITAMIN D) 400 UNITS capsule Take 400 Units by mouth daily.        Marland Kitchen DISCONTD: NON FORMULARY Estrogen/progesterone pills- Pt unsure of name        Past history family history social history reviewed in the electronic medical record.     Objective:   Physical Exam BP 132/74  Pulse 71  Temp 99.1 F (37.3 C) (Oral)  Ht 5' 4.5" (1.638 m)  Wt 114 lb (51.71 kg)  BMI 19.27 kg/m2  SpO2 99% Physical Exam: Vital signs reviewed JYN:WGNF is a well-developed well-nourished alert cooperative  white female who appears her stated age in no acute distress.  HEENT: normocephalic atraumatic , Eyes: PERRL EOM's full, conjunctiva clear, Nares: paten,t no deformity discharge or tenderness., Ears: no  deformity EAC's clear TMs with normal landmarks. Mouth: clear OP, no lesions, edema.  Moist mucous membranes. Dentition in adequate repair. NECK: supple without masses, thyromegaly or bruits. CHEST/PULM:  Clear to auscultation and percussion breath sounds equal no wheeze , rales or rhonchi. No chest wall deformities or tenderness. CV: PMI is nondisplaced, S1 S2 no gallops, murmurs, rubs. Peripheral pulses are full without delay.No JVD .  ABDOMEN: Bowel sounds normal nontender  No guard or rebound, no hepato splenomegal no CVA tenderness.  No hernia. Breast: normal by inspection . No dimpling, discharge, masses, tenderness or discharge . Extremtities:  No clubbing  cyanosis or edema, no acute joint swelling or redness no focal atrophy NEURO:  Oriented x3, cranial nerves 3-12 appear to be intact, no obvious focal weakness,gait within normal limits no abnormal reflexes or asymmetrical SKIN: No acute rashes normal turgor, color, no bruising or petechiae. PSYCH: Oriented, good eye contact, no obvious depression anxiety, cognition and judgment appear normal. LN: no cervical axillary inguinal adenopathy Lab Results  Component Value Date   WBC 5.7 02/04/2012   HGB 12.9 02/04/2012   HCT 38.3 02/04/2012   PLT 165.0 02/04/2012   GLUCOSE 85 02/04/2012   CHOL 203* 02/04/2012   TRIG 34.0 02/04/2012   HDL 80.80 02/04/2012   LDLDIRECT 104.0 02/04/2012   LDLCALC 96 11/18/2010   ALT 12 02/04/2012   AST 19 02/04/2012   NA 139 02/04/2012   K 3.9 02/04/2012   CL 103 02/04/2012   CREATININE 0.9 02/04/2012   BUN 17 02/04/2012   CO2 27 02/04/2012   TSH 1.17 02/04/2012      Assessment & Plan:  Preventive Health Care Counseled regarding healthy nutrition, exercise, sleep, injury prevention, calcium vit d and healthy weight . Check in to zostavax.  Sleep apparently does well on 5 mg of Ambien discussed risk benefits potential side effects and dependency. At this time it is probably most reasonable to continue on the same as other medications may have similar side effects. She's going through a very stressful time with caretaking management [place].  Printed rx given for 30 x 2 to get filled when needed. Hormone replacement therapy and Celexa management per her gynecologist. HX OF MELANOMA Counseled today would have her come back in about 6 months if she is maintaining regular use of medication.

## 2012-02-11 NOTE — Patient Instructions (Addendum)
lifestyle intervention healthy eating and exercise . Continue sleep hygiene as possible. Check into zostavax  Labs are good.  ROV in 6 months  If needed for med evaluation. Or call about progress.    Insomnia Insomnia is frequent trouble falling and/or staying asleep. Insomnia can be a long term problem or a short term problem. Both are common. Insomnia can be a short term problem when the wakefulness is related to a certain stress or worry. Long term insomnia is often related to ongoing stress during waking hours and/or poor sleeping habits. Overtime, sleep deprivation itself can make the problem worse. Every little thing feels more severe because you are overtired and your ability to cope is decreased. CAUSES   Stress, anxiety, and depression.   Poor sleeping habits.   Distractions such as TV in the bedroom.   Naps close to bedtime.   Engaging in emotionally charged conversations before bed.   Technical reading before sleep.   Alcohol and other sedatives. They may make the problem worse. They can hurt normal sleep patterns and normal dream activity.   Stimulants such as caffeine for several hours prior to bedtime.   Pain syndromes and shortness of breath can cause insomnia.   Exercise late at night.   Changing time zones may cause sleeping problems (jet lag).  It is sometimes helpful to have someone observe your sleeping patterns. They should look for periods of not breathing during the night (sleep apnea). They should also look to see how long those periods last. If you live alone or observers are uncertain, you can also be observed at a sleep clinic where your sleep patterns will be professionally monitored. Sleep apnea requires a checkup and treatment. Give your caregivers your medical history. Give your caregivers observations your family has made about your sleep.  SYMPTOMS   Not feeling rested in the morning.   Anxiety and restlessness at bedtime.   Difficulty falling  and staying asleep.  TREATMENT   Your caregiver may prescribe treatment for an underlying medical disorders. Your caregiver can give advice or help if you are using alcohol or other drugs for self-medication. Treatment of underlying problems will usually eliminate insomnia problems.   Medications can be prescribed for short time use. They are generally not recommended for lengthy use.   Over-the-counter sleep medicines are not recommended for lengthy use. They can be habit forming.   You can promote easier sleeping by making lifestyle changes such as:   Using relaxation techniques that help with breathing and reduce muscle tension.   Exercising earlier in the day.   Changing your diet and the time of your last meal. No night time snacks.   Establish a regular time to go to bed.   Counseling can help with stressful problems and worry.   Soothing music and white noise may be helpful if there are background noises you cannot remove.   Stop tedious detailed work at least one hour before bedtime.  HOME CARE INSTRUCTIONS   Keep a diary. Inform your caregiver about your progress. This includes any medication side effects. See your caregiver regularly. Take note of:   Times when you are asleep.   Times when you are awake during the night.   The quality of your sleep.   How you feel the next day.  This information will help your caregiver care for you.  Get out of bed if you are still awake after 15 minutes. Read or do some quiet activity. Keep the lights  down. Wait until you feel sleepy and go back to bed.   Keep regular sleeping and waking hours. Avoid naps.   Exercise regularly.   Avoid distractions at bedtime. Distractions include watching television or engaging in any intense or detailed activity like attempting to balance the household checkbook.   Develop a bedtime ritual. Keep a familiar routine of bathing, brushing your teeth, climbing into bed at the same time each  night, listening to soothing music. Routines increase the success of falling to sleep faster.   Use relaxation techniques. This can be using breathing and muscle tension release routines. It can also include visualizing peaceful scenes. You can also help control troubling or intruding thoughts by keeping your mind occupied with boring or repetitive thoughts like the old concept of counting sheep. You can make it more creative like imagining planting one beautiful flower after another in your backyard garden.   During your day, work to eliminate stress. When this is not possible use some of the previous suggestions to help reduce the anxiety that accompanies stressful situations.  MAKE SURE YOU:   Understand these instructions.   Will watch your condition.   Will get help right away if you are not doing well or get worse.  Document Released: 07/11/2000 Document Revised: 07/03/2011 Document Reviewed: 08/11/2007 [facility] Patient Information 2012 [place], [place].

## 2012-08-31 ENCOUNTER — Telehealth: Payer: Self-pay | Admitting: Family Medicine

## 2012-08-31 NOTE — Telephone Encounter (Signed)
Ok to refill x 1. Ov before next refill

## 2012-08-31 NOTE — Telephone Encounter (Signed)
This patient is requesting a refill to be sent to [facility] on [address].  Last seen on 02/11/12 for CPE.  She should have returned in Jan.  No follow up scheduled.  Last filled on 07/09/12 #30 per the pharmacy.  Please advise.  Thanks!!

## 2012-09-01 ENCOUNTER — Other Ambulatory Visit: Payer: Self-pay | Admitting: Internal Medicine

## 2012-09-01 MED ORDER — ZOLPIDEM TARTRATE 10 MG PO TABS: 5.0000 mg | ORAL_TABLET | Freq: Every evening | ORAL | Status: DC | PRN

## 2012-09-01 NOTE — Telephone Encounter (Signed)
Called to the pharmacy and left on voicemail. 

## 2012-11-08 ENCOUNTER — Telehealth: Payer: Self-pay | Admitting: Family Medicine

## 2012-11-08 NOTE — Telephone Encounter (Signed)
Last seen: 02/11/12 Last filled: 09/01/12 No follow up Please advise Thanks!!

## 2012-11-09 ENCOUNTER — Other Ambulatory Visit: Payer: Self-pay | Admitting: Internal Medicine

## 2012-11-09 MED ORDER — ZOLPIDEM TARTRATE 10 MG PO TABS: 5.0000 mg | ORAL_TABLET | Freq: Every evening | ORAL | Status: DC | PRN

## 2012-11-09 NOTE — Telephone Encounter (Signed)
Can refill x 1 

## 2012-11-09 NOTE — Telephone Encounter (Signed)
Called to the pharmacy and left on voicemail. 

## 2013-01-17 ENCOUNTER — Telehealth: Payer: Self-pay | Admitting: Family Medicine

## 2013-01-17 ENCOUNTER — Other Ambulatory Visit: Payer: Self-pay | Admitting: Internal Medicine

## 2013-01-17 MED ORDER — ZOLPIDEM TARTRATE 10 MG PO TABS: 5.0000 mg | ORAL_TABLET | Freq: Every evening | ORAL | Status: DC | PRN

## 2013-01-17 NOTE — Telephone Encounter (Signed)
Last seen on 02/11/12 Last filled on 11/09/12 #30 with 0 additional refills Does not have a future appointment scheudled. Please advise.  Thanks!!

## 2013-01-17 NOTE — Telephone Encounter (Signed)
Can refill x 1  . Have her schedule a yearly OV for med check  To be completed before  Medication runs out.

## 2013-01-17 NOTE — Telephone Encounter (Signed)
Rx called to the pharmacy and left on voicemail.  Please schedule cpe per [facility].  Thanks!!

## 2013-01-18 NOTE — Telephone Encounter (Signed)
Pt states she will only do a med check this year.  Pt states she did CPE last year. Also. Pt would like to know if she could come in July 14 @ 4pm. (this is a 'same day' app) Pt has to work but can get off a little early that day for appt. Pls advise.

## 2013-01-19 NOTE — Telephone Encounter (Signed)
Ok to do ov that day.  Doesn't need a PV at this time .

## 2013-01-19 NOTE — Telephone Encounter (Signed)
Pt aware/kh 

## 2013-02-07 ENCOUNTER — Encounter: Payer: Self-pay | Admitting: Internal Medicine

## 2013-02-07 ENCOUNTER — Ambulatory Visit (INDEPENDENT_AMBULATORY_CARE_PROVIDER_SITE_OTHER): Payer: BC Managed Care – PPO | Admitting: Internal Medicine

## 2013-02-07 VITALS — BP 106/74 | HR 73 | Temp 98.8°F | Wt 119.0 lb

## 2013-02-07 DIAGNOSIS — G4700 Insomnia, unspecified: Secondary | ICD-10-CM

## 2013-02-07 DIAGNOSIS — Z79899 Other long term (current) drug therapy: Secondary | ICD-10-CM | POA: Insufficient documentation

## 2013-02-07 DIAGNOSIS — N959 Unspecified menopausal and perimenopausal disorder: Secondary | ICD-10-CM

## 2013-02-07 MED ORDER — CITALOPRAM HYDROBROMIDE 20 MG PO TABS: 20.0000 mg | ORAL_TABLET | Freq: Every day | ORAL | Status: DC

## 2013-02-07 MED ORDER — ZOLPIDEM TARTRATE 10 MG PO TABS: 5.0000 mg | ORAL_TABLET | Freq: Every evening | ORAL | Status: DC | PRN

## 2013-02-07 NOTE — Progress Notes (Signed)
Chief Complaint  Patient presents with  . Follow-up    Yearly med check    HPI: Patient comes in for medication evaluation and refills. Last visit was 7 /13 . Has had a rough and rocky year but is medically stable no emergency room visits change in health surgeries.  Sleep ;Taking 1/2 ambien  10 .    No sig se on years . Is aware of potential side effects we have talked about in the past  Citalopram helpful and doesn't want to go off of it . Leveling mood stress.    No sig se   2 jobs  And  caretaking  With parents.    To be a mother typing grandmother type for upcoming birth and family wants to make sure T. Is updated ROS: See pertinent positives and negatives per HPI. No chest pain shortness of breath change in bowel habits. Is on hormone replacement therapy per GYN.  Past Medical History  Diagnosis Date  . Mood disorder   . Anemia   . Hx of melanoma of skin 2011    left foot     Family History  Problem Relation Age of Onset  . Macular degeneration Mother   . Hypertension Mother   . Diabetes Father   . Heart attack Father     x 2  . Heart disease Father   . Alcohol abuse Sister   . Alcohol abuse Brother   . Deep vein thrombosis Brother     disable and immoble also nephew  . Ovarian cancer Sister   . Lung cancer    . Colon cancer      History   Social History  . Marital Status: Single    Spouse Name: N/A    Number of Children: N/A  . Years of Education: N/A   Social History Main Topics  . Smoking status: Never Smoker   . Smokeless tobacco: None     Comment: 3 times a year  . Alcohol Use: 1.8 oz/week    3 Glasses of wine per week  . Drug Use: No  . Sexually Active: None   Other Topics Concern  . None   Social History Narrative   Single   HH of 1   Pets 1 Cat   Neg ets firearms wears seat belts has smoke alarm.    G1    2 jobs       Had smoked a few time per year  And stopped totally for 3 years.    Sister d  ovarian cancer/ parents ailing  declining they live in [place] she is to travel back and forth. Sr. alcoholic   Occasional social alcohol          Outpatient Encounter Prescriptions as of 02/07/2013  Medication Sig Dispense Refill  . citalopram (CELEXA) 20 MG tablet Take 1 tablet (20 mg total) by mouth daily.  90 tablet  3  . estradiol (VIVELLE-DOT) 0.05 MG/24HR Place 1 patch onto the skin 2 (two) times a week. Replace on Sunday and Wednesday       . fish oil-omega-3 fatty acids 1000 MG capsule Take 2 g by mouth daily.        . medroxyPROGESTERone (PROVERA) 5 MG tablet Take 5 mg by mouth daily.        Marland Kitchen zolpidem (AMBIEN) 10 MG tablet Take 0.5 tablets (5 mg total) by mouth at bedtime as needed.  30 tablet  5  . [DISCONTINUED] citalopram (CELEXA) 20 MG tablet  Take 20 mg by mouth daily.        . [DISCONTINUED] zolpidem (AMBIEN) 10 MG tablet Take 0.5 tablets (5 mg total) by mouth at bedtime as needed.  30 tablet  0  . valACYclovir (VALTREX) 500 MG tablet Take 1 tablet (500 mg total) by mouth 2 (two) times daily.  60 tablet  1   No facility-administered encounter medications on file as of 02/07/2013.    EXAM:  BP 106/74  Pulse 73  Temp(Src) 98.8 F (37.1 C) (Oral)  Wt 119 lb (53.978 kg)  BMI 20.12 kg/m2  SpO2 98%  Body mass index is 20.12 kg/(m^2).  GENERAL: vitals reviewed and listed above, alert, oriented, appears well hydrated and in no acute distress  HEENT: atraumatic, conjunctiva  clear, no obvious abnormalities on inspection of external nose and ears eoms full perrl OP : no lesion edema or exudate  NECK: no obvious masses on inspection palpation  No adenopathy LUNGS: clear to auscultation bilaterally, no wheezes, rales or rhonchi, good air movement CV: HRRR, no clubbing cyanosis or  peripheral edema nl cap refill  Abdomen:  Sof,t normal bowel sounds without hepatosplenomegaly, no guarding rebound or masses no CVA tenderness MS: moves all extremities without noticeable focal  abnormality PSYCH: pleasant and  cooperative, no obvious depression or anxiety Lab Results  Component Value Date   WBC 5.7 02/04/2012   HGB 12.9 02/04/2012   HCT 38.3 02/04/2012   PLT 165.0 02/04/2012   GLUCOSE 85 02/04/2012   CHOL 203* 02/04/2012   TRIG 34.0 02/04/2012   HDL 80.80 02/04/2012   LDLDIRECT 104.0 02/04/2012   LDLCALC 96 11/18/2010   ALT 12 02/04/2012   AST 19 02/04/2012   NA 139 02/04/2012   K 3.9 02/04/2012   CL 103 02/04/2012   CREATININE 0.9 02/04/2012   BUN 17 02/04/2012   CO2 27 02/04/2012   TSH 1.17 02/04/2012    ASSESSMENT AND PLAN:  Discussed the following assessment and plan:  INSOMNIA  PERIMENOPAUSAL SYNDROME  Medication management  Patient aware risk-benefit of medications ;maintain at this time. At some point in the future she can try to wean but not at this time refill the medicines contact us for refill in 6 months for the Ambien if she wants to stay on it checked yearly otherwise reviewed laboratory findings last year no need to repeat them this year. Unless she has new symptom complex.   She states she is up-to-date on her health care parameters otherwise her last Tdap was 2012 this should cover for newborn exposure to flu shot in the fall -Patient advised to return or notify health care team  if symptoms worsen or persist or new concerns arise.  Patient Instructions  Continue lifestyle intervention healthy eating and exercise . REcheck in a year .  Labs were good last year.    Neta Mends. Panosh M.D.

## 2013-02-07 NOTE — Patient Instructions (Addendum)
Continue lifestyle intervention healthy eating and exercise . REcheck in a year .  Labs were good last year.

## 2013-02-14 ENCOUNTER — Telehealth: Payer: Self-pay | Admitting: Internal Medicine

## 2013-02-14 NOTE — Telephone Encounter (Signed)
If this is viral may take 2 weeks to be better . If allergy meds may take 3-7 days to work . If no fever   Can treat sx and see how she does.

## 2013-02-14 NOTE — Telephone Encounter (Signed)
FYI:  I spoke to the pt.  Verified she has no fever.  Does have nasal congestion that is clear/white but thick.  Has a cough that is keeping her awake and a sore throat when she coughs.  Feels like she may have drainage in her ears but has no pain.  Has some sinus pressure.  Since she seemed to have no signs of infection advised her to try OTC Allegra, Zyrtec etc.  Advised she could also try OTC nasal spray such as Nasocort.  Pt stated she did not want antibiotics at this time.  Instructed her to call back by Thursday to let us know of her condition or sooner if sx worsen.

## 2013-02-14 NOTE — Telephone Encounter (Signed)
Patient Information:  Caller Name: Syanna  Phone: [phone]  Patient: Kendra Hoffman, Kendra Hoffman  Gender: Female  DOB: 1956-02-24  Age: 57 Years  PCP: Berniece Andreas (Family Practice)  Office Follow Up:  Does the office need to follow up with this patient?: Yes  Instructions For The Office: Please review, contact patient at  [phone]   Symptoms  Reason For Call & Symptoms: Sore throat started Sat 7/19.  Drainage down back of throat, nasal and head congestion started last evening 7/20 and got worse thru the night.  Can hear fluid in ears but no ear pain.  Reviewed Health History In EMR: Yes  Reviewed Medications In EMR: Yes  Reviewed Allergies In EMR: Yes  Reviewed Surgeries / Procedures: Yes  Date of Onset of Symptoms: 02/12/2013  Treatments Tried: saline gargles for throat  Treatments Tried Worked: Yes  Guideline(s) Used:  Sinus Pain and Congestion  Disposition Per Guideline:   Home Care  Reason For Disposition Reached:   Sinus congestion as part of a cold, present < 10 days  Advice Given:  N/A  Patient Refused Recommendation:  Patient Requests Prescription  Patient declining OV Appt - says just recently in office for medication check.   Requesting Antibiotic to be called in, uses [facility].

## 2013-04-05 ENCOUNTER — Other Ambulatory Visit: Payer: Self-pay | Admitting: Obstetrics & Gynecology

## 2013-04-05 NOTE — Telephone Encounter (Signed)
AEX scheduled for 04/26/13 #8/0rfs sent to pharmacy

## 2013-04-26 ENCOUNTER — Ambulatory Visit: Payer: Self-pay | Admitting: Obstetrics & Gynecology

## 2013-05-10 ENCOUNTER — Encounter: Payer: Self-pay | Admitting: Obstetrics & Gynecology

## 2013-05-10 ENCOUNTER — Ambulatory Visit (INDEPENDENT_AMBULATORY_CARE_PROVIDER_SITE_OTHER): Payer: BC Managed Care – PPO | Admitting: Obstetrics & Gynecology

## 2013-05-10 VITALS — BP 116/64 | HR 64 | Resp 16 | Ht 64.25 in | Wt 120.2 lb

## 2013-05-10 DIAGNOSIS — Z124 Encounter for screening for malignant neoplasm of cervix: Secondary | ICD-10-CM

## 2013-05-10 DIAGNOSIS — Z01419 Encounter for gynecological examination (general) (routine) without abnormal findings: Secondary | ICD-10-CM

## 2013-05-10 MED ORDER — MEDROXYPROGESTERONE ACETATE 5 MG PO TABS: 5.0000 mg | ORAL_TABLET | Freq: Every day | ORAL | Status: DC

## 2013-05-10 MED ORDER — ESTRADIOL 0.1 MG/24HR TD PTTW: MEDICATED_PATCH | TRANSDERMAL | Status: DC

## 2013-05-10 NOTE — Patient Instructions (Signed)

## 2013-05-10 NOTE — Progress Notes (Signed)
57 y.o. G1P0010 SingleCaucasianF here for annual exam.  Doing well.  No recent trips.  No vaginal bleeding.    Patient's last menstrual period was 07/28/2008.          Sexually active: no  The current method of family planning is none.    Exercising: no  not regularly Smoker:  no  Health Maintenance: Pap:  03/30/12 WNL History of abnormal Pap:  yes MMG:  04/05/13 3D normal Colonoscopy:  2013 repeat in 5 years, Dr. Joseph Art ([facility], [place] [facility]) BMD:   2010 TDaP:  2012 Screening Labs: PCP, Hb today: PCP, Urine today: PCP   reports that she has never smoked. She has never used smokeless tobacco. She reports that she drinks about 1.8 ounces of alcohol per week. She reports that she does not use illicit drugs.  Past Medical History  Diagnosis Date  . Mood disorder   . Anemia   . Hx of melanoma of skin 2011    left foot   . Amenorrhea   . Breast nodule 09/2006    no mass    Past Surgical History  Procedure Laterality Date  . Ovarian cyst removal    . Bunionectomy      right  . Melanoma excision  2012    left foot     Current Outpatient Prescriptions  Medication Sig Dispense Refill  . citalopram (CELEXA) 20 MG tablet Take 1 tablet (20 mg total) by mouth daily.  90 tablet  3  . fish oil-omega-3 fatty acids 1000 MG capsule Take 2 g by mouth daily.        . medroxyPROGESTERone (PROVERA) 5 MG tablet Take 5 mg by mouth daily.        Marland Kitchen VIVELLE-DOT 0.1 MG/24HR patch Apply one patch externally twice weekly as directed  8 patch  0  . zolpidem (AMBIEN) 10 MG tablet Take 0.5 tablets (5 mg total) by mouth at bedtime as needed.  30 tablet  5  . valACYclovir (VALTREX) 500 MG tablet Take 1 tablet (500 mg total) by mouth 2 (two) times daily.  60 tablet  1   No current facility-administered medications for this visit.    Family History  Problem Relation Age of Onset  . Macular degeneration Mother   . Hypertension Mother   . Diabetes Father   . Heart attack Father     x 2  . Heart  disease Father   . Alcohol abuse Sister   . Alcohol abuse Brother   . Deep vein thrombosis Brother     disable and immoble also nephew  . Ovarian cancer Sister   . Lung cancer    . Colon cancer      ROS:  Pertinent items are noted in HPI.  Otherwise, a comprehensive ROS was negative.  Exam:   BP 116/64  Pulse 64  Resp 16  Ht 5' 4.25" (1.632 m)  Wt 120 lb 3.2 oz (54.522 kg)  BMI 20.47 kg/m2  LMP 07/28/2008  Weight change: +8lbs  Height: 5' 4.25" (163.2 cm)  Ht Readings from Last 3 Encounters:  05/10/13 5' 4.25" (1.632 m)  02/11/12 5' 4.5" (1.638 m)  05/19/11 5\' 5"  (1.651 m)    General appearance: alert, cooperative and appears stated age Head: Normocephalic, without obvious abnormality, atraumatic Neck: no adenopathy, supple, symmetrical, trachea midline and thyroid normal to inspection and palpation Lungs: clear to auscultation bilaterally Breasts: normal appearance, no masses or tenderness Heart: regular rate and rhythm Abdomen: soft, non-tender; bowel  sounds normal; no masses,  no organomegaly Extremities: extremities normal, atraumatic, no cyanosis or edema Skin: Skin color, texture, turgor normal. No rashes or lesions Lymph nodes: Cervical, supraclavicular, and axillary nodes normal. No abnormal inguinal nodes palpated Neurologic: Grossly normal   Pelvic: External genitalia:  no lesions              Urethra:  normal appearing urethra with no masses, tenderness or lesions              Bartholins and Skenes: normal                 Vagina: normal appearing vagina with normal color and discharge, no lesions              Cervix: no lesions              Pap taken: yes Bimanual Exam:  Uterus:  normal size, contour, position, consistency, mobility, non-tender              Adnexa: normal adnexa and no mass, fullness, tenderness               Rectovaginal: Confirms               Anus:  normal sphincter tone, no lesions  A:  Well Woman with normal exam H/O  melanoma Vaginal atrophic changes On HRT  P:   Mammogram yearly.  D/W pt 3D MMG pap smear On Vivelle dot 0.1mg  patches (1/2 patch) twice weekly and Provera 5mg  (1/2 tab) daily.  Rx to pharmacy. See Dr. Danella Deis yearly. return annually or prn  An After Visit Summary was printed and given to the patient.

## 2013-05-13 LAB — IPS PAP TEST WITH HPV

## 2013-09-03 ENCOUNTER — Other Ambulatory Visit: Payer: Self-pay | Admitting: Internal Medicine

## 2013-09-05 NOTE — Telephone Encounter (Signed)
Last seen and filled on 02/07/13 #30 with an additional 5 refills Currently she has no future follow up scheduled Please advise.  Thanks!

## 2013-09-06 NOTE — Telephone Encounter (Signed)
Ok to refill through July when she should have PV cpx . Have her make appt for that time. However make sure she has signed contract and urine screen . Thanks.

## 2013-09-16 ENCOUNTER — Encounter: Payer: Self-pay | Admitting: Internal Medicine

## 2013-09-16 NOTE — Progress Notes (Signed)
   Subjective:    Patient ID: Kendra Hoffman, female    DOB: 03/14/1956, 58 y.o.   MRN: [id number]  HPI    Review of Systems     Objective:   Physical Exam        Assessment & Plan:   toxicology screen pos citalopram neg for Ambien

## 2013-10-10 ENCOUNTER — Encounter: Payer: Self-pay | Admitting: Internal Medicine

## 2014-01-16 ENCOUNTER — Other Ambulatory Visit: Payer: BC Managed Care – PPO

## 2014-01-18 ENCOUNTER — Other Ambulatory Visit (INDEPENDENT_AMBULATORY_CARE_PROVIDER_SITE_OTHER): Payer: BC Managed Care – PPO

## 2014-01-18 DIAGNOSIS — Z0000 Encounter for general adult medical examination without abnormal findings: Secondary | ICD-10-CM

## 2014-01-18 LAB — LIPID PANEL
CHOLESTEROL: 192 mg/dL (ref 0–200)
HDL: 71.2 mg/dL (ref >39.00)
LDL Cholesterol: 110 mg/dL — ABNORMAL HIGH (ref 0–99)
NONHDL: 120.8
Total CHOL/HDL Ratio: 3
Triglycerides: 53 mg/dL (ref 0.0–149.0)
VLDL: 10.6 mg/dL (ref 0.0–40.0)

## 2014-01-18 LAB — CBC WITH DIFFERENTIAL/PLATELET
BASOS ABS: 0 10*3/uL (ref 0.0–0.1)
Basophils Relative: 0.2 % (ref 0.0–3.0)
Eosinophils Absolute: 0.2 10*3/uL (ref 0.0–0.7)
Eosinophils Relative: 4 % (ref 0.0–5.0)
HEMATOCRIT: 39.9 % (ref 36.0–46.0)
HEMOGLOBIN: 13.5 g/dL (ref 12.0–15.0)
LYMPHS ABS: 1.8 10*3/uL (ref 0.7–4.0)
Lymphocytes Relative: 37.2 % (ref 12.0–46.0)
MCHC: 33.8 g/dL (ref 30.0–36.0)
MCV: 95.9 fl (ref 78.0–100.0)
MONO ABS: 0.6 10*3/uL (ref 0.1–1.0)
MONOS PCT: 12.5 % — AB (ref 3.0–12.0)
NEUTROS ABS: 2.2 10*3/uL (ref 1.4–7.7)
Neutrophils Relative %: 46.1 % (ref 43.0–77.0)
PLATELETS: 227 10*3/uL (ref 150.0–400.0)
RBC: 4.17 Mil/uL (ref 3.87–5.11)
RDW: 13.6 % (ref 11.5–15.5)
WBC: 4.9 10*3/uL (ref 4.0–10.5)

## 2014-01-18 LAB — BASIC METABOLIC PANEL
BUN: 13 mg/dL (ref 6–23)
CHLORIDE: 105 meq/L (ref 96–112)
CO2: 28 meq/L (ref 19–32)
Calcium: 8.9 mg/dL (ref 8.4–10.5)
Creatinine, Ser: 0.9 mg/dL (ref 0.4–1.2)
GFR: 68.26 mL/min (ref >60.00)
GLUCOSE: 84 mg/dL (ref 70–99)
POTASSIUM: 4.1 meq/L (ref 3.5–5.1)
SODIUM: 138 meq/L (ref 135–145)

## 2014-01-18 LAB — HEPATIC FUNCTION PANEL
ALBUMIN: 4.2 g/dL (ref 3.5–5.2)
ALT: 13 U/L (ref 0–35)
AST: 20 U/L (ref 0–37)
Alkaline Phosphatase: 50 U/L (ref 39–117)
BILIRUBIN TOTAL: 0.7 mg/dL (ref 0.2–1.2)
Bilirubin, Direct: 0.1 mg/dL (ref 0.0–0.3)
Total Protein: 6.7 g/dL (ref 6.0–8.3)

## 2014-01-18 LAB — TSH: TSH: 2 u[IU]/mL (ref 0.35–4.50)

## 2014-01-24 ENCOUNTER — Other Ambulatory Visit: Payer: Self-pay | Admitting: Internal Medicine

## 2014-01-25 NOTE — Telephone Encounter (Signed)
Denied.  Not seen since 2013.

## 2014-01-29 ENCOUNTER — Other Ambulatory Visit: Payer: Self-pay | Admitting: Internal Medicine

## 2014-02-01 ENCOUNTER — Other Ambulatory Visit: Payer: BC Managed Care – PPO

## 2014-02-01 NOTE — Telephone Encounter (Signed)
Ok to refill x 30 days

## 2014-02-01 NOTE — Telephone Encounter (Signed)
Last seen 01/2012.   Has a future appointment on 02/07/2014. Please advise.  Thanks!

## 2014-02-02 NOTE — Telephone Encounter (Signed)
Sent to the pharmacy by e-scribe. 

## 2014-02-07 ENCOUNTER — Ambulatory Visit (INDEPENDENT_AMBULATORY_CARE_PROVIDER_SITE_OTHER): Payer: BC Managed Care – PPO | Admitting: Internal Medicine

## 2014-02-07 ENCOUNTER — Encounter: Payer: Self-pay | Admitting: Internal Medicine

## 2014-02-07 VITALS — BP 106/60 | Temp 99.3°F | Ht 64.5 in | Wt 124.0 lb

## 2014-02-07 DIAGNOSIS — Z79899 Other long term (current) drug therapy: Secondary | ICD-10-CM

## 2014-02-07 DIAGNOSIS — Z0000 Encounter for general adult medical examination without abnormal findings: Secondary | ICD-10-CM

## 2014-02-07 DIAGNOSIS — G4700 Insomnia, unspecified: Secondary | ICD-10-CM

## 2014-02-07 NOTE — Patient Instructions (Signed)
Continue lifestyle intervention healthy eating and exercise . 150 minutes of exercise weeks  ,  weight  To healthy levels. Avoid trans fats and processed foods;  Increase fresh fruits and veges to 5 servings per day. And avoid sweet beverages  Including tea and juice.  Can try dec citalopram to 10 alt with 20 mg per day for a few weeks then 10 mg per day and maintain if doing well.  Agree with limiting  Zolpidem to as needed.  Exercise may help sleep eventually. Wellness visit in a year or as needed

## 2014-02-07 NOTE — Progress Notes (Signed)
Pre visit review using our clinic review tool, if applicable. No additional management support is needed unless otherwise documented below in the visit note.  Chief Complaint  Patient presents with  . Annual Exam    Pt has started herself taking ASA 325 mg daily.    HPI: Patient comes in today for Preventive Health Care visit . Since her last visit she has had no major changes in her health. Still thinks she Needs to see audiologist for some urine loss but hasn't gone yet. She is avoiding taking the Ambien on a regular basis and would like to get off anyway. She's taking citalopram 20 mg and even out her mood interested in weaning if appropriate. Generally she is well. Up to date GYN:. Health Maintenance  Topic Date Due  . Influenza Vaccine  02/25/2014  . Mammogram  04/06/2015  . Pap Smear  05/10/2016  . Tetanus/tdap  11/17/2020  . Colonoscopy  01/18/2022   Health Maintenance Review LIFESTYLE:  Exercise:   Not  Tobacco/ETS:no   Alcohol: whatever 3 per week Sugar beverages:  no Sleep:  Dec zolpidem    Sometimes fitfull Drug use: no Colonoscopy:  On 5 year plan utd    ROS:  GEN/ HEENT: No fever, significant weight changes sweats headaches vision problems hearing changes, CV/ PULM; No chest pain shortness of breath cough, syncope,edema  change in exercise tolerance. GI /GU: No adominal pain, vomiting, change in bowel habits. No blood in the stool. No significant GU symptoms. SKIN/HEME: ,no acute skin rashes suspicious lesions or bleeding. No lymphadenopathy, nodules, masses.  NEURO/ PSYCH:  No neurologic signs such as weakness numbness.  IMM/ Allergy: No unusual infections.  Allergy .   REST of 12 system review negative except as per HPI   Past Medical History  Diagnosis Date  . Mood disorder   . Anemia   . Hx of melanoma of skin 2011    left foot   . Amenorrhea   . Breast nodule 09/2006    no mass    Family History  Problem Relation Age of Onset  . Macular  degeneration Mother   . Hypertension Mother   . Diabetes Father   . Heart attack Father     x 2  . Heart disease Father   . Alcohol abuse Sister   . Alcohol abuse Brother   . Deep vein thrombosis Brother     disable and immoble also nephew  . Ovarian cancer Sister   . Lung cancer    . Colon cancer      History   Social History  . Marital Status: Single    Spouse Name: N/A    Number of Children: N/A  . Years of Education: N/A   Social History Main Topics  . Smoking status: Never Smoker   . Smokeless tobacco: Never Used     Comment: 3 times a year  . Alcohol Use: 1.8 oz/week    3 Glasses of wine per week  . Drug Use: No  . Sexual Activity: No   Other Topics Concern  . None   Social History Narrative   Single   HH of 1   Pets 1 Cat   Neg ets firearms wears seat belts has smoke alarm.    G1    2 jobs       Had smoked a few time per year  And stopped totally for 3 years.    Sister d  ovarian cancer/ parents ailing declining they  live in [place] she is to travel back and forth. Sr. alcoholic   Occasional social alcohol   Down to 40 hours              Outpatient Encounter Prescriptions as of 02/07/2014  Medication Sig  . aspirin 325 MG tablet Take 325 mg by mouth daily.  . citalopram (CELEXA) 20 MG tablet TAKE ONE TABLET BY MOUTH ONE TIME DAILY   . estradiol (VIVELLE-DOT) 0.1 MG/24HR patch Apply one patch externally twice weekly as directed  . fish oil-omega-3 fatty acids 1000 MG capsule Take 2 g by mouth daily.    . medroxyPROGESTERone (PROVERA) 5 MG tablet Take 1 tablet (5 mg total) by mouth daily.  Marland Kitchen zolpidem (AMBIEN) 10 MG tablet TAKE ONE-HALF TABLET BY MOUTH AT BEDTIME AS NEEDED   . [DISCONTINUED] valACYclovir (VALTREX) 500 MG tablet Take 1 tablet (500 mg total) by mouth 2 (two) times daily.    EXAM:  BP 106/60  Temp(Src) 99.3 F (37.4 C) (Oral)  Ht 5' 4.5" (1.638 m)  Wt 124 lb (56.246 kg)  BMI 20.96 kg/m2  LMP 07/28/2008  Body mass index is  20.96 kg/(m^2).  Physical Exam: Vital signs reviewed YQM:GNOI is a well-developed well-nourished alert cooperative    who appearsr stated age in no acute distress.  HEENT: normocephalic atraumatic , Eyes: PERRL EOM's full, conjunctiva clear, Nares: paten,t no deformity discharge or tenderness., Ears: no deformity EAC's clear TMs with normal landmarks. Mouth: clear OP, no lesions, edema.  Moist mucous membranes. Dentition in adequate repair. NECK: supple without masses, thyromegaly or bruits. CHEST/PULM:  Clear to auscultation and percussion breath sounds equal no wheeze , rales or rhonchi. No chest wall deformities or tenderness. Breast: normal by inspection . No dimpling, discharge, masses, tenderness or discharge . CV: PMI is nondisplaced, S1 S2 no gallops, murmurs, rubs. Peripheral pulses are full without delay.No JVD .  ABDOMEN: Bowel sounds normal nontender  No guard or rebound, no hepato splenomegal no CVA tenderness.  No hernia. Extremtities:  No clubbing cyanosis or edema, no acute joint swelling or redness no focal atrophy NEURO:  Oriented x3, cranial nerves 3-12 appear to be intact, no obvious focal weakness,gait within normal limits no abnormal reflexes or asymmetrical SKIN: No acute rashes normal turgor, color, no bruising or petechiae. PSYCH: Oriented, good eye contact, no obvious depression anxiety, cognition and judgment appear normal. LN: no cervical axillary inguinal adenopathy  Lab Results  Component Value Date   WBC 4.9 01/18/2014   HGB 13.5 01/18/2014   HCT 39.9 01/18/2014   PLT 227.0 01/18/2014   GLUCOSE 84 01/18/2014   CHOL 192 01/18/2014   TRIG 53.0 01/18/2014   HDL 71.20 01/18/2014   LDLDIRECT 104.0 02/04/2012   LDLCALC 110* 01/18/2014   ALT 13 01/18/2014   AST 20 01/18/2014   NA 138 01/18/2014   K 4.1 01/18/2014   CL 105 01/18/2014   CREATININE 0.9 01/18/2014   BUN 13 01/18/2014   CO2 28 01/18/2014   TSH 2.00 01/18/2014    ASSESSMENT AND PLAN:  Discussed the following  assessment and plan:  Visit for preventive health examination  INSOMNIA - Agree with lifestyle strategies minimizing medication  Medication management - Reasonable to try lower dose of Celexa if appropriate Laboratory reviewed Counseled regarding healthy nutrition, exercise, sleep, injury prevention, calcium vit d and healthy weight .  Patient Care Team: Burnis Medin, MD as PCP - General Lyman Speller, MD (Gynecology) [facility] Myrtice Lauth, MD as Attending Physician (Dermatology)  Patient Instructions  Continue lifestyle intervention healthy eating and exercise . 150 minutes of exercise weeks  ,  weight  To healthy levels. Avoid trans fats and processed foods;  Increase fresh fruits and veges to 5 servings per day. And avoid sweet beverages  Including tea and juice.  Can try dec citalopram to 10 alt with 20 mg per day for a few weeks then 10 mg per day and maintain if doing well.  Agree with limiting  Zolpidem to as needed.  Exercise may help sleep eventually. Wellness visit in a year or as needed     Standley Brooking. Letonia Stead M.D.

## 2014-03-01 ENCOUNTER — Other Ambulatory Visit: Payer: Self-pay | Admitting: Internal Medicine

## 2014-03-02 NOTE — Telephone Encounter (Signed)
Sent to the pharmacy by e-scribe. 

## 2014-05-03 ENCOUNTER — Telehealth: Payer: Self-pay | Admitting: Internal Medicine

## 2014-05-03 ENCOUNTER — Encounter: Payer: Self-pay | Admitting: Internal Medicine

## 2014-05-03 ENCOUNTER — Ambulatory Visit (INDEPENDENT_AMBULATORY_CARE_PROVIDER_SITE_OTHER): Payer: BC Managed Care – PPO | Admitting: Internal Medicine

## 2014-05-03 VITALS — BP 110/70 | Temp 99.1°F | Wt 127.0 lb

## 2014-05-03 DIAGNOSIS — B9789 Other viral agents as the cause of diseases classified elsewhere: Principal | ICD-10-CM

## 2014-05-03 DIAGNOSIS — J069 Acute upper respiratory infection, unspecified: Secondary | ICD-10-CM

## 2014-05-03 DIAGNOSIS — H6981 Other specified disorders of Eustachian tube, right ear: Secondary | ICD-10-CM

## 2014-05-03 MED ORDER — BENZONATATE 100 MG PO CAPS: 100.0000 mg | ORAL_CAPSULE | Freq: Three times a day (TID) | ORAL | Status: DC | PRN

## 2014-05-03 MED ORDER — HYDROCODONE-HOMATROPINE 5-1.5 MG/5ML PO SYRP: 5.0000 mL | ORAL_SOLUTION | ORAL | Status: DC | PRN

## 2014-05-03 NOTE — Progress Notes (Signed)
Pre visit review using our clinic review tool, if applicable. No additional management support is needed unless otherwise documented below in the visit note. 

## 2014-05-03 NOTE — Progress Notes (Signed)
Chief Complaint  Patient presents with  . Cough    HPI: Patient Kendra Hoffman  comes in today for SDA for  new problem evaluation.   Onset with sore throat and hoarseness. About a week ago And now mores congestion and throat still hurts and hurts stomach badly for a day or 2 From coughing  ocass  phlegm No fever  throught maybe   No sob or wheezing hard to work because she doesn't need to cough at work [employer]. -wise note medicine she does have some right ear congestion facial pressure more on the right no unilateral nasal congestion adenopathy or pain ROS: See pertinent positives and negatives per HPI. No chest pain shortness of breath hemoptysis  Past Medical History  Diagnosis Date  . Mood disorder   . Anemia   . Hx of melanoma of skin 2011    left foot   . Amenorrhea   . Breast nodule 09/2006    no mass    Family History  Problem Relation Age of Onset  . Macular degeneration Mother   . Hypertension Mother   . Diabetes Father   . Heart attack Father     x 2  . Heart disease Father   . Alcohol abuse Sister   . Alcohol abuse Brother   . Deep vein thrombosis Brother     disable and immoble also nephew  . Ovarian cancer Sister   . Lung cancer    . Colon cancer      History   Social History  . Marital Status: Single    Spouse Name: N/A    Number of Children: N/A  . Years of Education: N/A   Social History Main Topics  . Smoking status: Never Smoker   . Smokeless tobacco: Never Used     Comment: 3 times a year  . Alcohol Use: 1.8 oz/week    3 Glasses of wine per week  . Drug Use: No  . Sexual Activity: No   Other Topics Concern  . None   Social History Narrative   Single   HH of 1   Pets 1 Cat   Neg ets firearms wears seat belts has smoke alarm.    G1    2 jobs       Had smoked a few time per year  And stopped totally for 3 years.    Sister d  ovarian cancer/ parents ailing declining they live in [place] she is to travel back  and forth. Sr. alcoholic   Occasional social alcohol   Down to 40 hours              Outpatient Encounter Prescriptions as of 05/03/2014  Medication Sig  . aspirin 325 MG tablet Take 325 mg by mouth daily.  . citalopram (CELEXA) 20 MG tablet TAKE ONE TABLET BY MOUTH ONE TIME DAILY   . estradiol (VIVELLE-DOT) 0.1 MG/24HR patch Apply one patch externally twice weekly as directed  . medroxyPROGESTERone (PROVERA) 5 MG tablet Take 1 tablet (5 mg total) by mouth daily.  Marland Kitchen zolpidem (AMBIEN) 10 MG tablet TAKE ONE-HALF TABLET BY MOUTH AT BEDTIME AS NEEDED   . benzonatate (TESSALON) 100 MG capsule Take 1 capsule (100 mg total) by mouth 3 (three) times daily as needed for cough.  Marland Kitchen HYDROcodone-homatropine (HYCODAN) 5-1.5 MG/5ML syrup Take 5 mLs by mouth every 4 (four) hours as needed for cough. 1-2 tsp  . [DISCONTINUED] citalopram (CELEXA) 20 MG tablet TAKE ONE  TABLET BY MOUTH ONE TIME DAILY  . [DISCONTINUED] fish oil-omega-3 fatty acids 1000 MG capsule Take 2 g by mouth daily.      EXAM:  BP 110/70  Temp(Src) 99.1 F (37.3 C) (Oral)  Wt 127 lb (57.607 kg)  LMP 07/28/2008  Body mass index is 21.47 kg/(m^2).  GENERAL: vitals reviewed and listed above, alert, oriented, appears well hydrated and in no acute distress she looks very congested and a bit tired but nontoxic respirations are unlabored HEENT: atraumatic, conjunctiva  clear, no obvious abnormalities on inspection of external nose and ears face non-tender  OP : no lesion edema or exudate slightly more red on the right than the left no drainage seen TMs left normal right slightly flushed but translucent and landmarks normal except for slightly distorted landmark NECK: no obvious masses on inspection palpation no adenopathy LUNGS: clear to auscultation bilaterally, no wheezes, rales or rhonchi, good air movement CV: HRRR, no clubbing cyanosis or  peripheral edema nl cap refill  Abdomen soft without organomegaly guarding or rebound there is  a tender area mid band right periumbilical no bruising masses noted MS: moves all extremities without noticeable focal  abnormality PSYCH: pleasant and cooperative, no obvious depression or anxiety  ASSESSMENT AND PLAN:  Discussed the following assessment and plan:  Viral upper respiratory tract infection with cough  Eustachian tube dysfunction, right At this time appears uncomplicated but has some right sided sinus eustachian tube symptoms we'll try day decongestant Afrin at night cough for comfort explained normal cough reflex that we'll b totally suppressed. Her should it be. Expectant management comfort measures otherwise if progressing and right-sided facial pressure ear pain contact us consider antibiotic addition. -Patient advised to return or notify health care team  if symptoms worsen ,persist or new concerns arise.  Patient Instructions  This appears to be a viral respiratory infection and will need to run its course.  Decongestants such as Sudafed pseudoephedrine the one you have to sign for or phenylephrine pills   during the day may help open the sinus congestion. Mucinex D. is 1 version At night try generic Afrin nose spray for 3 nights only to open up the nasal passages and sinuses. Sometimes this helps with the cough  Tessalon Perles can be tried for cough suppression as needed sugar-free candy hot warm liquids; tea with honey.  If the right-sided face pressure is getting worse with pain persistent and worsening over the next days contact our office. These are signs of a progressing sinus infection. Most sinus infections get better without antibiotics for the first 10+ days but is progressing may respond to an antibiotic at that time.  Cough could last for a few weeks but shouldn't be this dramatic.     Standley Brooking. Bowie Doiron M.D.  Risk-benefit of cough medicines discussed

## 2014-05-03 NOTE — Telephone Encounter (Signed)
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 Pt has head congestion, sore throat, fluid in ears, for a week, and wanted to know what to do for this. Pt states she doesssn't want triage line again.  Advised pt w/ ll; those issues, she would probably need to be seen.  appt made 4 pm today,

## 2014-05-03 NOTE — Patient Instructions (Addendum)
This appears to be a viral respiratory infection and will need to run its course.  Decongestants such as Sudafed pseudoephedrine the one you have to sign for or phenylephrine pills   during the day may help open the sinus congestion. Mucinex D. is 1 version At night try generic Afrin nose spray for 3 nights only to open up the nasal passages and sinuses. Sometimes this helps with the cough  Tessalon Perles can be tried for cough suppression as needed sugar-free candy hot warm liquids; tea with honey.  If the right-sided face pressure is getting worse with pain persistent and worsening over the next days contact our office. These are signs of a progressing sinus infection. Most sinus infections get better without antibiotics for the first 10+ days but is progressing may respond to an antibiotic at that time.  Cough could last for a few weeks but shouldn't be this dramatic.

## 2014-05-05 NOTE — Telephone Encounter (Signed)
Saw patient 10 7  Misty please call her and update her sx asw we discussed at her OV.  ? Fever ? Ear face pain etc  (To decide if antibiotic rx for sinusitis would help her)  This message otherwise makes no sense.

## 2014-05-05 NOTE — Telephone Encounter (Signed)
Pt states she is blowing "outmeal" out of her head.  No fever.  All sinus issues.  Can't hear and the pressure is building in her head.  Pt states she does not want to be questioned about this any further.  Please advise.  Pt notified to check with her pharmacy for medications.

## 2014-05-05 NOTE — Telephone Encounter (Signed)
Pt states she really need that abx now, she is done messing around. Pt is not better. [employer]

## 2014-05-08 MED ORDER — AMOXICILLIN-POT CLAVULANATE 875-125 MG PO TABS: 1.0000 | ORAL_TABLET | Freq: Two times a day (BID) | ORAL | Status: DC

## 2014-05-08 NOTE — Telephone Encounter (Signed)
Antibiotic sent in  Meant to do this before weekend . Apologies for delay.

## 2014-05-12 NOTE — Telephone Encounter (Signed)
LM informing the pt that I was calling to see how she was feeling and if she was better.  Advised she call back if needed any further medical attention.

## 2014-05-25 ENCOUNTER — Telehealth: Payer: Self-pay | Admitting: Obstetrics & Gynecology

## 2014-05-25 NOTE — Telephone Encounter (Signed)
(  message on answering machine 05/24/14 @ 8:43pm) Patient is asking to talk with billing regarding getting documentation of a diagnosis. Patient says she needs to provide this to her insurance company.

## 2014-05-29 ENCOUNTER — Encounter: Payer: Self-pay | Admitting: Internal Medicine

## 2014-05-30 NOTE — Telephone Encounter (Signed)
Call to patient, LMTCB

## 2014-05-30 NOTE — Telephone Encounter (Signed)
Pt calling back regarding previous message.

## 2014-05-30 NOTE — Telephone Encounter (Signed)
Discussed with Maryann Conners to determine how to proceed based on documentation in the chart.

## 2014-05-31 ENCOUNTER — Encounter: Payer: Self-pay | Admitting: Obstetrics & Gynecology

## 2014-05-31 ENCOUNTER — Encounter: Payer: Self-pay | Admitting: *Deleted

## 2014-05-31 NOTE — Telephone Encounter (Signed)
Returning call to [place].

## 2014-05-31 NOTE — Telephone Encounter (Signed)
Letter is written.  You can print and I will sign tomorrow and we can send to her or she can just get it on Monday when she comes.  Thanks.

## 2014-05-31 NOTE — Telephone Encounter (Addendum)
Return call to patient. She understands that the 3D MMG is an additional charge. She does not understand why [employer] has previously paid for this and now they are denying it. She is taking this up with them and would just like a letter from Dr Sabra Heck to assist her with this process and justify a 3D for her with her very dense breast. Advised I will review this with Dr Sabra Heck but it will take a few days. Patient states she has appointment on Monday and her goal would be to leave with letter on Monday.     Is this something you want me to write and you can review?

## 2014-05-31 NOTE — Telephone Encounter (Signed)
Letter printed and to your desk. Patient plans to pick up at appointment Monday. Encounter closed.

## 2014-06-05 ENCOUNTER — Ambulatory Visit (INDEPENDENT_AMBULATORY_CARE_PROVIDER_SITE_OTHER): Payer: BC Managed Care – PPO | Admitting: Obstetrics & Gynecology

## 2014-06-05 ENCOUNTER — Encounter: Payer: Self-pay | Admitting: Obstetrics & Gynecology

## 2014-06-05 VITALS — BP 110/60 | HR 72 | Resp 16 | Ht 64.5 in | Wt 127.0 lb

## 2014-06-05 DIAGNOSIS — Z01419 Encounter for gynecological examination (general) (routine) without abnormal findings: Secondary | ICD-10-CM

## 2014-06-05 MED ORDER — MEDROXYPROGESTERONE ACETATE 5 MG PO TABS: 5.0000 mg | ORAL_TABLET | Freq: Every day | ORAL | Status: DC

## 2014-06-05 MED ORDER — CITALOPRAM HYDROBROMIDE 20 MG PO TABS: ORAL_TABLET | ORAL | Status: DC

## 2014-06-05 MED ORDER — ESTRADIOL 0.1 MG/24HR TD PTTW: MEDICATED_PATCH | TRANSDERMAL | Status: DC

## 2014-06-05 NOTE — Progress Notes (Signed)
58 y.o. G1P0010 SingleCaucasianF here for annual exam.  No vaginal bleeding.  Doing well.  After sister's death, her nephew moved to town.  He has a daughter that has become a surrogate grandchild.   Seeing Dr. Tonia Brooms yearly due to melanoma hx.   PCP:  Dr. Regis Bill.  Full labs 6/15.  Reviewed with pt.  These are in EPIC.  Patient's last menstrual period was 07/28/2008.          Sexually active: No.  The current method of family planning is post menopausal status.    Exercising: No.  The patient does not participate in regular exercise at present. Smoker:  no  Health Maintenance: Pap: 05/10/13 Neg HR HPV: neg History of abnormal Pap:  yes MMG: 04/26/14 3D BIRADS1: neg Colonoscopy: 12/2011 repeat in 10 years - Normal, Dr. Clydene Laming BMD: 2008 TDaP: 2012 Screening Labs: PCP, Hb today: PCP, Urine today: PCP   reports that she has never smoked. She has never used smokeless tobacco. She reports that she drinks about 1.8 oz of alcohol per week. She reports that she does not use illicit drugs.  Past Medical History  Diagnosis Date  . Mood disorder   . Anemia   . Hx of melanoma of skin 2011    left foot   . Amenorrhea   . Breast nodule 09/2006    no mass    Past Surgical History  Procedure Laterality Date  . Ovarian cyst removal    . Bunionectomy      right  . Melanoma excision  2012    left foot     Current Outpatient Prescriptions  Medication Sig Dispense Refill  . aspirin 325 MG tablet Take 325 mg by mouth daily.    . citalopram (CELEXA) 20 MG tablet TAKE ONE TABLET BY MOUTH ONE TIME DAILY  30 tablet 10  . estradiol (VIVELLE-DOT) 0.1 MG/24HR patch Apply one patch externally twice weekly as directed 8 patch 13  . medroxyPROGESTERone (PROVERA) 5 MG tablet Take 1 tablet (5 mg total) by mouth daily. 30 tablet 13   No current facility-administered medications for this visit.    Family History  Problem Relation Age of Onset  . Macular degeneration Mother   . Hypertension Mother   .  Diabetes Father   . Heart attack Father     x 2  . Heart disease Father   . Alcohol abuse Sister   . Alcohol abuse Brother   . Deep vein thrombosis Brother     disable and immoble also nephew  . Ovarian cancer Sister   . Lung cancer    . Colon cancer      ROS:  Pertinent items are noted in HPI.  Otherwise, a comprehensive ROS was negative.  Exam:   BP 110/60 mmHg  Pulse 72  Resp 16  Ht 5' 4.5" (1.638 m)  Wt 127 lb (57.607 kg)  BMI 21.47 kg/m2  LMP 07/28/2008  Weight change: -7#:   Height: 5' 4.5" (163.8 cm)  Ht Readings from Last 3 Encounters:  06/05/14 5' 4.5" (1.638 m)  02/07/14 5' 4.5" (1.638 m)  05/10/13 5' 4.25" (1.632 m)    General appearance: alert, cooperative and appears stated age Head: Normocephalic, without obvious abnormality, atraumatic Neck: no adenopathy, supple, symmetrical, trachea midline and thyroid normal to inspection and palpation Lungs: clear to auscultation bilaterally Breasts: normal appearance, no masses or tenderness Heart: regular rate and rhythm Abdomen: soft, non-tender; bowel sounds normal; no masses,  no organomegaly Extremities: extremities  normal, atraumatic, no cyanosis or edema Skin: Skin color, texture, turgor normal. No rashes or lesions Lymph nodes: Cervical, supraclavicular, and axillary nodes normal. No abnormal inguinal nodes palpated Neurologic: Grossly normal   Pelvic: External genitalia:  no lesions              Urethra:  normal appearing urethra with no masses, tenderness or lesions              Bartholins and Skenes: normal                 Vagina: normal appearing vagina with normal color and discharge, no lesions              Cervix: no lesions              Pap taken: No. Bimanual Exam:  Uterus:  normal size, contour, position, consistency, mobility, non-tender              Adnexa: normal adnexa and no mass, fullness, tenderness               Rectovaginal: Confirms               Anus:  normal sphincter tone, no  lesions  A:  Well Woman with normal exam H/O melanoma Vaginal atrophic changes On HRT  P: Mammogram yearly. D/W pt 3D MMG pap smear with neg HR HPV 2014.  No pap today.   On Vivelle dot 0.1mg  patches (1/2 patch) twice weekly and Provera 5mg  (1/2 tab) daily. Rx to pharmacy. Celexa 20mg  daily  #90/4RF Labs with Dr. Regis Bill 6/15 See Dr. Tonia Brooms yearly. return annually or prn  An After Visit Summary was printed and given to the patient.

## 2015-03-12 ENCOUNTER — Other Ambulatory Visit: Payer: Self-pay

## 2015-03-13 ENCOUNTER — Other Ambulatory Visit: Payer: Self-pay

## 2015-03-13 MED ORDER — ESTRADIOL 0.1 MG/24HR TD PTTW: MEDICATED_PATCH | TRANSDERMAL | Status: DC

## 2015-03-13 NOTE — Telephone Encounter (Signed)
Medication refill request: Estradiol  Last AEX:  06/05/14 SM Next AEX: 08/14/15 SM Last MMG (if hormonal medication request): 04/26/14 Dense Category D, Bi-rads 1 neg. Refill authorized: Pt. Is requesting a 90 day supply.

## 2015-06-19 ENCOUNTER — Ambulatory Visit: Payer: Self-pay | Admitting: Obstetrics & Gynecology

## 2015-07-06 ENCOUNTER — Ambulatory Visit: Payer: Self-pay | Admitting: Obstetrics & Gynecology

## 2015-07-25 ENCOUNTER — Ambulatory Visit (INDEPENDENT_AMBULATORY_CARE_PROVIDER_SITE_OTHER): Payer: 59 | Admitting: Obstetrics and Gynecology

## 2015-07-25 ENCOUNTER — Encounter: Payer: Self-pay | Admitting: Obstetrics and Gynecology

## 2015-07-25 VITALS — BP 118/70 | HR 68 | Resp 14 | Ht 64.5 in | Wt 126.0 lb

## 2015-07-25 DIAGNOSIS — Z8041 Family history of malignant neoplasm of ovary: Secondary | ICD-10-CM

## 2015-07-25 DIAGNOSIS — Z0000 Encounter for general adult medical examination without abnormal findings: Secondary | ICD-10-CM | POA: Diagnosis not present

## 2015-07-25 DIAGNOSIS — Z124 Encounter for screening for malignant neoplasm of cervix: Secondary | ICD-10-CM

## 2015-07-25 DIAGNOSIS — Z01419 Encounter for gynecological examination (general) (routine) without abnormal findings: Secondary | ICD-10-CM

## 2015-07-25 DIAGNOSIS — N949 Unspecified condition associated with female genital organs and menstrual cycle: Secondary | ICD-10-CM | POA: Diagnosis not present

## 2015-07-25 LAB — POCT URINALYSIS DIPSTICK
BILIRUBIN UA: NEGATIVE
GLUCOSE UA: NEGATIVE
Ketones, UA: NEGATIVE
Leukocytes, UA: NEGATIVE
NITRITE UA: NEGATIVE
Protein, UA: NEGATIVE
RBC UA: NEGATIVE
UROBILINOGEN UA: NEGATIVE
pH, UA: 6

## 2015-07-25 MED ORDER — ESTRADIOL 0.05 MG/24HR TD PTWK: 0.0500 mg | MEDICATED_PATCH | TRANSDERMAL | Status: DC

## 2015-07-25 MED ORDER — MEDROXYPROGESTERONE ACETATE 2.5 MG PO TABS: 2.5000 mg | ORAL_TABLET | Freq: Every day | ORAL | Status: DC

## 2015-07-25 MED ORDER — CITALOPRAM HYDROBROMIDE 20 MG PO TABS: ORAL_TABLET | ORAL | Status: DC

## 2015-07-25 NOTE — Patient Instructions (Signed)

## 2015-07-25 NOTE — Progress Notes (Signed)
Patient ID: Kendra Hoffman, female   DOB: 1955/09/06, 59 y.o.   MRN: [id number] 59 y.o. G1P0010 SingleCaucasianF here for annual exam.  She is on daily HRT, doing well. Was on the combipatch, too expensive. She is on daily provera and uses 1/2 of a vivelle dot 2 x a week. She is only having occasional hot flash. No bleeding. Not sexually active.   Sister died of ovarian cancer, diagnosed at 3, no BRCA testing. Died 3 years ago.    Patient's last menstrual period was 07/28/2008.          Sexually active: No.  The current method of family planning is post menopausal status.    Exercising: No.  The patient does not participate in regular exercise at present. Smoker:  no  Health Maintenance: Pap:  05-10-13 WNL NEG HR HPV History of abnormal Pap:  Yes, h/o cryosurgery in her 20's, normal since then MMG:  06-04-15 WNL Colonoscopy:  01-19-12 WNL per patient BMD:  10 years ago TDaP:  11-18-10 Gardasil: N/A   reports that she has never smoked. She has never used smokeless tobacco. She reports that she drinks about 1.8 oz of alcohol per week. She reports that she does not use illicit drugs. She is an Scientist, physiological. No kids, very close to family.   Past Medical History  Diagnosis Date  . Mood disorder ([place])   . Anemia   . Hx of melanoma of skin 2011    left foot   . Amenorrhea   . Breast nodule 09/2006    no mass    Past Surgical History  Procedure Laterality Date  . Ovarian cyst removal    . Bunionectomy      right  . Melanoma excision  2012    left foot     Current Outpatient Prescriptions  Medication Sig Dispense Refill  . aspirin 325 MG tablet Take 325 mg by mouth daily.    . citalopram (CELEXA) 20 MG tablet TAKE ONE TABLET BY MOUTH ONE TIME DAILY 90 tablet 4  . estradiol (VIVELLE-DOT) 0.1 MG/24HR patch Apply one patch externally twice weekly as directed 24 patch 0  . medroxyPROGESTERone (PROVERA) 5 MG tablet Take 1 tablet (5 mg total) by mouth daily. 30 tablet 13   No  current facility-administered medications for this visit.    Family History  Problem Relation Age of Onset  . Macular degeneration Mother   . Hypertension Mother   . Diabetes Father   . Heart attack Father     x 2  . Heart disease Father   . Alcohol abuse Sister   . Alcohol abuse Brother   . Deep vein thrombosis Brother     disable and immoble also nephew  . Ovarian cancer Sister   . Lung cancer    . Colon cancer    Sister with ovarian cancer 66  Review of Systems  Constitutional: Positive for unexpected weight change.  HENT: Positive for hearing loss and sinus pressure.   Genitourinary:       Loss of sexual interest  Night urination   Musculoskeletal: Positive for myalgias.    Exam:   BP 118/70 mmHg  Pulse 68  Resp 14  Ht 5' 4.5" (1.638 m)  Wt 126 lb (57.153 kg)  BMI 21.30 kg/m2  LMP 07/28/2008  Weight change: _0 @ Height:   Height: 5' 4.5" (163.8 cm)  Ht Readings from Last 3 Encounters:  07/25/15 5' 4.5" (1.638 m)  06/05/14 5' 4.5" (1.638 m)  02/07/14 5' 4.5" (1.638 m)    General appearance: alert, cooperative and appears stated age Head: Normocephalic, without obvious abnormality, atraumatic Neck: no adenopathy, supple, symmetrical, trachea midline and thyroid normal to inspection and palpation Lungs: clear to auscultation bilaterally Breasts: normal appearance, no masses or tenderness Heart: regular rate and rhythm Abdomen: soft, non-tender; bowel sounds normal; no masses,  no organomegaly Extremities: extremities normal, atraumatic, no cyanosis or edema Skin: Skin color, texture, turgor normal. No rashes or lesions Lymph nodes: Cervical, supraclavicular, and axillary nodes normal. No abnormal inguinal nodes palpated Neurologic: Grossly normal   Pelvic: External genitalia:  no lesions              Urethra:  normal appearing urethra with no masses, tenderness or lesions              Bartholins and Skenes: normal                 Vagina: normal  appearing vagina with normal color and discharge, no lesions              Cervix: no lesions               Bimanual Exam:  Uterus:  normal size, contour, position, consistency, mobility, non-tender and anteverted              Adnexa: non-tender, fullness noted in the left adnexa on recto-vaginal exam               Rectovaginal: Confirms               Anus:  normal sphincter tone, no lesions  Chaperone was present for exam.  A:  Well Woman with normal exam  Menopausal syndrome, wants to continue on HRT at the current dose, understands the risks, including: blood clots, CVA, heart attack  Sister with ovarian cancer, discussed that there is no standard of care with a FH of ovarian cancer. Discussed the option of ultrasounds and CA 125, including the limitations and false  Positives   Adnexal fullness on exam  Mood disorder, well controlled on celexa  P:   Pap with reflex hpv (patient desires)  Labs and immunizations with primary  Mammogram and colonoscopy UTD  Return for a GYN ultrasound, will further discuss CA 125 at that visit  Continue breast self exams  Discussed calcium and vit d intake, recommended she get her vit D checked with her primary  Continue HRT and celexa

## 2015-07-26 ENCOUNTER — Telehealth: Payer: Self-pay | Admitting: Obstetrics and Gynecology

## 2015-07-26 LAB — IPS PAP TEST WITH REFLEX TO HPV

## 2015-07-26 NOTE — Telephone Encounter (Signed)
Spoke with pt regarding benefit for ultrasound. Patient understood and agreeable. Patient ready to schedule. Patient scheduled 08/07/15 with Dr. Talbert Nan. Pt aware of arrival date and time. Pt aware of 72 hours cancellation policy with [id number] fee. No further questions. Ok to close

## 2015-08-07 ENCOUNTER — Ambulatory Visit (INDEPENDENT_AMBULATORY_CARE_PROVIDER_SITE_OTHER): Payer: BLUE CROSS/BLUE SHIELD | Admitting: Obstetrics and Gynecology

## 2015-08-07 ENCOUNTER — Ambulatory Visit (INDEPENDENT_AMBULATORY_CARE_PROVIDER_SITE_OTHER): Payer: BLUE CROSS/BLUE SHIELD

## 2015-08-07 ENCOUNTER — Encounter: Payer: Self-pay | Admitting: Obstetrics and Gynecology

## 2015-08-07 ENCOUNTER — Other Ambulatory Visit: Payer: 59

## 2015-08-07 VITALS — BP 112/60 | HR 68 | Resp 14 | Wt 127.0 lb

## 2015-08-07 DIAGNOSIS — Z8041 Family history of malignant neoplasm of ovary: Secondary | ICD-10-CM

## 2015-08-07 DIAGNOSIS — N949 Unspecified condition associated with female genital organs and menstrual cycle: Secondary | ICD-10-CM

## 2015-08-07 DIAGNOSIS — N83202 Unspecified ovarian cyst, left side: Secondary | ICD-10-CM | POA: Diagnosis not present

## 2015-08-07 NOTE — Progress Notes (Signed)
GYNECOLOGY  VISIT   HPI: 60 y.o.   Single  Caucasian  female   G1P0010 with Patient's last menstrual period was 07/28/2008.   here for a pelvic ultrasound. The patient was noted to have a left adnexal mass at her recent annual exam. +FH of ovarian cancer in her sister.   GYNECOLOGIC HISTORY: Patient's last menstrual period was 07/28/2008. Contraception:postmenopause Menopausal hormone therapy: Estradiol/ Provera         OB History    Gravida Para Term Preterm AB TAB SAB Ectopic Multiple Living   1 0   1     0         Patient Active Problem List   Diagnosis Date Noted  . Medication management 02/07/2013  . Visit for preventive health examination 11/18/2010  . Hx of melanoma of skin   . PERIMENOPAUSAL SYNDROME 12/21/2009  . HAND PAIN, BILATERAL 12/21/2009  . 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 INSOMNIA 10/13/2007  . BACK PAIN 04/26/2007    Past Medical History  Diagnosis Date  . Mood disorder ([place])   . Anemia   . Hx of melanoma of skin 2011    left foot   . Amenorrhea   . Breast nodule 09/2006    no mass    Past Surgical History  Procedure Laterality Date  . Ovarian cyst removal    . Bunionectomy      right  . Melanoma excision  2012    left foot     Current Outpatient Prescriptions  Medication Sig Dispense Refill  . aspirin 325 MG tablet Take 325 mg by mouth daily.    . citalopram (CELEXA) 20 MG tablet TAKE ONE TABLET BY MOUTH ONE TIME DAILY 90 tablet 4  . estradiol (CLIMARA - DOSED IN MG/24 HR) 0.05 mg/24hr patch Place 1 patch (0.05 mg total) onto the skin once a week. 12 patch 4  . medroxyPROGESTERone (PROVERA) 2.5 MG tablet Take 1 tablet (2.5 mg total) by mouth daily. 90 tablet 4   No current facility-administered medications for this visit.     ALLERGIES: Bactrim and Sulfamethoxazole  Family History  Problem Relation Age of Onset  . Macular degeneration Mother   . Hypertension Mother   . Diabetes Father   . Heart attack Father     x 2  . Heart disease  Father   . Alcohol abuse Sister   . Alcohol abuse Brother   . Deep vein thrombosis Brother     disable and immoble also nephew  . Ovarian cancer Sister   . Lung cancer    . Colon cancer      Social History   Social History  . Marital Status: Single    Spouse Name: N/A  . Number of Children: N/A  . Years of Education: N/A   Occupational History  . Not on file.   Social History Main Topics  . Smoking status: Never Smoker   . Smokeless tobacco: Never Used     Comment: 3 times a year  . Alcohol Use: 1.8 oz/week    3 Glasses of wine per week  . Drug Use: No  . Sexual Activity: No   Other Topics Concern  . Not on file   Social History Narrative   Single   HH of 1   Pets 1 Cat   Neg ets firearms wears seat belts has smoke alarm.    G1    2 jobs       Had smoked a  few time per year  And stopped totally for 3 years.    Sister d  ovarian cancer/ parents ailing declining they live in [place] she is to travel back and forth. Sr. alcoholic   Occasional social alcohol   Down to 40 hours              Review of Systems  Constitutional: Negative.   HENT: Negative.   Eyes: Negative.   Respiratory: Negative.   Cardiovascular: Negative.   Gastrointestinal: Negative.   Genitourinary: Negative.   Musculoskeletal: Negative.   Skin: Negative.   Neurological: Negative.   Endo/Heme/Allergies: Negative.   Psychiatric/Behavioral: Negative.     PHYSICAL EXAMINATION:    BP 112/60 mmHg  Pulse 68  Resp 14  Wt 127 lb (57.607 kg)  LMP 07/28/2008    General appearance: alert, cooperative and appears stated age   Ultrasound pictures were reviewed with the patient  ASSESSMENT Left adnexal mass, c/w a dermoid cyst. Prior h/o removal of a dermoid many years ago, partial oophorectomy at that time. Her right ovary is very small on ultrasound FH of ovarian cancer    PLAN Laparoscopic BSO, surgery reviewed, including risks of infection and damage to surrounding organs,  particularly vessels and ureters given her prior surgery CA 125   An After Visit Summary was printed and given to the patient.  15 minutes face to face time of which over 50% was spent in counseling.

## 2015-08-08 LAB — CA 125: CA 125: 16 U/mL (ref <35)

## 2015-08-10 ENCOUNTER — Telehealth: Payer: Self-pay | Admitting: *Deleted

## 2015-08-10 ENCOUNTER — Telehealth: Payer: Self-pay | Admitting: Obstetrics and Gynecology

## 2015-08-10 NOTE — Telephone Encounter (Signed)
Called patient to discuss benefits for a procedure. Left Voicemail requesting a call. °

## 2015-08-10 NOTE — Telephone Encounter (Signed)
Patient returning call.

## 2015-08-10 NOTE — Telephone Encounter (Signed)
Call to patient to discuss surgery date options. Surgery scheduled for 09-03-12 at 0730 at [facility]. Surgery instruction sheet reviewed and printed copy mailed, see copy scanned to chart.   Routing to provider for final review. Patient agreeable to disposition. Will close encounter.

## 2015-08-10 NOTE — Telephone Encounter (Signed)
Spoke with pt regarding benefit for surgery. Patient understood and agreeable. Patient ready to schedule. Patient provided full payment for her benefits over the phone. Patient aware this is professional benefit only. Patient aware will be contacted by hospital for separate benefits.  Forward to [place] to schedule.

## 2015-08-14 ENCOUNTER — Ambulatory Visit: Payer: BC Managed Care – PPO | Admitting: Obstetrics & Gynecology

## 2015-08-20 ENCOUNTER — Encounter: Payer: Self-pay | Admitting: Obstetrics and Gynecology

## 2015-08-20 ENCOUNTER — Ambulatory Visit (INDEPENDENT_AMBULATORY_CARE_PROVIDER_SITE_OTHER): Payer: BLUE CROSS/BLUE SHIELD | Admitting: Obstetrics and Gynecology

## 2015-08-20 VITALS — BP 108/60 | HR 80 | Resp 16 | Wt 128.0 lb

## 2015-08-20 DIAGNOSIS — N83202 Unspecified ovarian cyst, left side: Secondary | ICD-10-CM

## 2015-08-20 NOTE — Progress Notes (Signed)
Patient ID: Kendra Hoffman, female   DOB: 03-03-1956, 60 y.o.   MRN: [id number] GYNECOLOGY  VISIT   HPI: 60 y.o.   Single  Caucasian  female   G1P0010 with Patient's last menstrual period was 07/28/2008.   here for pre surgery visit. Patient will be having -[place] with pelvic washings- schdeuled for 09/04/15. The patient was noted to have an adnexal mass at her annual exam. U/S revealed a 6.6 cm complex left adnexal mass cw a dermoid. She has a h/o removal of a right dermoid in the past (done via laparotomy).  GYNECOLOGIC HISTORY: Patient's last menstrual period was 07/28/2008. Contraception:postmenopause Menopausal hormone therapy: None        OB History    Gravida Para Term Preterm AB TAB SAB Ectopic Multiple Living   1 0   1     0         Patient Active Problem List   Diagnosis Date Noted  . Medication management 02/07/2013  . Visit for preventive health examination 11/18/2010  . Hx of melanoma of skin   . PERIMENOPAUSAL SYNDROME 12/21/2009  . HAND PAIN, BILATERAL 12/21/2009  . 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 INSOMNIA 10/13/2007  . BACK PAIN 04/26/2007    Past Medical History  Diagnosis Date  . Mood disorder ([place])   . Anemia   . Hx of melanoma of skin 2011    left foot   . Amenorrhea   . Breast nodule 09/2006    no mass    Past Surgical History  Procedure Laterality Date  . Ovarian cyst removal    . Bunionectomy      right  . Melanoma excision  2012    left foot     Current Outpatient Prescriptions  Medication Sig Dispense Refill  . aspirin 325 MG tablet Take 162.5 mg by mouth daily.     . citalopram (CELEXA) 20 MG tablet TAKE ONE TABLET BY MOUTH ONE TIME DAILY (Patient taking differently: Take 20 mg by mouth daily. TAKE ONE TABLET BY MOUTH ONE TIME DAILY) 90 tablet 4  . estradiol (CLIMARA - DOSED IN MG/24 HR) 0.05 mg/24hr patch Place 1 patch (0.05 mg total) onto the skin once a week. 12 patch 4  . medroxyPROGESTERone (PROVERA) 2.5 MG  tablet Take 1 tablet (2.5 mg total) by mouth daily. 90 tablet 4   No current facility-administered medications for this visit.     ALLERGIES: Bactrim and Sulfamethoxazole  Family History  Problem Relation Age of Onset  . Macular degeneration Mother   . Hypertension Mother   . Diabetes Father   . Heart attack Father     x 2  . Heart disease Father   . Alcohol abuse Sister   . Alcohol abuse Brother   . Deep vein thrombosis Brother     disable and immoble also nephew  . Ovarian cancer Sister   . Lung cancer    . Colon cancer    Brother with DVT, while ill with complications from ETOH abuse Sister diagnosed with ovarian cancer at 65  Social History   Social History  . Marital Status: Single    Spouse Name: N/A  . Number of Children: N/A  . Years of Education: N/A   Occupational History  . Not on file.   Social History Main Topics  . Smoking status: Never Smoker   . Smokeless tobacco: Never Used  . Alcohol Use: 1.8 oz/week    3 Glasses of wine per week  .  Drug Use: No  . Sexual Activity: No   Other Topics Concern  . Not on file   Social History Narrative   Single   HH of 1   Pets 1 Cat   Neg ets firearms wears seat belts has smoke alarm.    G1    2 jobs       Had smoked a few time per year  And stopped totally for 3 years.    Sister d  ovarian cancer/ parents ailing declining they live in [place] she is to travel back and forth. Sr. alcoholic   Occasional social alcohol   Down to 40 hours              Review of Systems  Constitutional: Negative.   HENT: Negative.   Eyes: Negative.   Respiratory: Negative.   Cardiovascular: Negative.   Gastrointestinal: Negative.   Genitourinary: Negative.   Musculoskeletal: Negative.   Skin: Negative.   Neurological: Negative.   Endo/Heme/Allergies: Negative.   Psychiatric/Behavioral: Negative.     PHYSICAL EXAMINATION:    BP 108/60 mmHg  Pulse 80  Resp 16  Wt 128 lb (58.06 kg)  LMP 07/28/2008     General appearance: alert, cooperative and appears stated age Neck: no adenopathy, supple, symmetrical, trachea midline and thyroid normal to inspection and palpation Heart: regular rate and rhythm Lungs: CTAB Abdomen: soft, non-tender; bowel sounds normal; no masses,  no organomegaly Lungs: CTAB Extremities: normal, atraumatic, no cyanosis Skin: normal color, texture and turgor, no rashes or lesions Lymph: normal cervical supraclavicular and inguinal nodes Neurologic: grossly normal   ASSESSMENT Left ovarian mass cw dermoid, normal CA 125 FH of ovarian cancer Prior laparotomy with removal of right dermoid  PLAN Plan laparoscopic BSO Discussed risks, including: damage to bowel/bladder/ureters/or vessels. Discussed that her risks of adhesions are higher given her prior laparotomy and partial right oophorectomy. Discussed the risk of needing a laparotomy No antibiotics needed    An After Visit Summary was printed and given to the patient.

## 2015-08-21 ENCOUNTER — Telehealth: Payer: Self-pay | Admitting: Obstetrics and Gynecology

## 2015-08-21 NOTE — Telephone Encounter (Signed)
Dr.Jertson, okay to write letter for patient to be out of work from 09/04/2015-09/18/2015 due to surgery scheduled for laparoscopic bilateral salpingo oopherectomyon 09/04/2015?

## 2015-08-21 NOTE — Telephone Encounter (Signed)
Patient says she need a letter for her employer explaining why she will be out for 2 weeks. Ok to mail to her.

## 2015-08-22 NOTE — Telephone Encounter (Signed)
Yes, thank you.

## 2015-08-23 NOTE — Patient Instructions (Addendum)
Your procedure is scheduled on:  Tuesday, Feb. 7, 2017  Enter through the [employer] [facility] at:  6:00 A.M.  Pick up the phone at the desk and dial [phone].  Call this number if you have problems the morning of surgery: [phone].  Remember: Do NOT eat food or drink after:  Midnight Monday  Take these medicines the morning of surgery with a SIP OF WATER: None  Do NOT wear jewelry (body piercing), metal hair clips/bobby pins, or nail polish. Do NOT wear lotions, powders, or perfumes.  You may wear deoderant. Do NOT shave for 48 hours prior to surgery. Do NOT bring valuables to the hospital.  Have an adult drive you home and stay with you for the first 24 hours after procedure.

## 2015-08-23 NOTE — Telephone Encounter (Signed)
Letter saved and printed for Dr.Jertson's review prior to calling patient to notify letter is ready.

## 2015-08-23 NOTE — Telephone Encounter (Signed)
Spoke with patient. Patient states she would like letter sent to her home address on file. Address verified with patient. Letter sent to home address on file.  Routing to provider for final review. Patient agreeable to disposition. Will close encounter.

## 2015-08-23 NOTE — Telephone Encounter (Signed)
Left message to call [place] at [phone].  Letter has been written and signed by Dr.Jertson. Ready for sending. Need to verify where she would like letter sent.

## 2015-08-23 NOTE — Telephone Encounter (Signed)
Patient returning your call.

## 2015-08-24 ENCOUNTER — Encounter (HOSPITAL_COMMUNITY)
Admission: RE | Admit: 2015-08-24 | Discharge: 2015-08-24 | Disposition: A | Discharge status: Home / Self Care | Payer: BLUE CROSS/BLUE SHIELD | Source: Ambulatory Visit | Attending: Obstetrics and Gynecology | Admitting: Obstetrics and Gynecology

## 2015-08-24 ENCOUNTER — Encounter (HOSPITAL_COMMUNITY): Payer: Self-pay

## 2015-08-24 DIAGNOSIS — N839 Noninflammatory disorder of ovary, fallopian tube and broad ligament, unspecified: Secondary | ICD-10-CM | POA: Diagnosis not present

## 2015-08-24 DIAGNOSIS — Z9889 Other specified postprocedural states: Secondary | ICD-10-CM | POA: Diagnosis not present

## 2015-08-24 DIAGNOSIS — Z888 Allergy status to other drugs, medicaments and biological substances status: Secondary | ICD-10-CM | POA: Insufficient documentation

## 2015-08-24 DIAGNOSIS — Z8041 Family history of malignant neoplasm of ovary: Secondary | ICD-10-CM | POA: Diagnosis not present

## 2015-08-24 DIAGNOSIS — Z01812 Encounter for preprocedural laboratory examination: Secondary | ICD-10-CM | POA: Diagnosis not present

## 2015-08-24 HISTORY — DX: Major depressive disorder, single episode, unspecified: F32.9

## 2015-08-24 HISTORY — DX: Depression, unspecified: F32.A

## 2015-08-24 HISTORY — DX: Other specified postprocedural states: Z98.890

## 2015-08-24 HISTORY — DX: Unspecified osteoarthritis, unspecified site: M19.90

## 2015-08-24 HISTORY — DX: Nausea with vomiting, unspecified: R11.2

## 2015-08-24 HISTORY — DX: Malignant (primary) neoplasm, unspecified: C80.1

## 2015-08-24 LAB — CBC
HEMATOCRIT: 40.4 % (ref 36.0–46.0)
HEMOGLOBIN: 13.8 g/dL (ref 12.0–15.0)
MCH: 32.7 pg (ref 26.0–34.0)
MCHC: 34.2 g/dL (ref 30.0–36.0)
MCV: 95.7 fL (ref 78.0–100.0)
Platelets: 249 10*3/uL (ref 150–400)
RBC: 4.22 MIL/uL (ref 3.87–5.11)
RDW: 13.4 % (ref 11.5–15.5)
WBC: 5.5 10*3/uL (ref 4.0–10.5)

## 2015-08-24 LAB — COMPREHENSIVE METABOLIC PANEL
ALBUMIN: 4.6 g/dL (ref 3.5–5.0)
ALK PHOS: 56 U/L (ref 38–126)
ALT: 11 U/L — AB (ref 14–54)
AST: 19 U/L (ref 15–41)
Anion gap: 9 (ref 5–15)
BILIRUBIN TOTAL: 0.7 mg/dL (ref 0.3–1.2)
BUN: 17 mg/dL (ref 6–20)
CALCIUM: 9.3 mg/dL (ref 8.9–10.3)
CO2: 26 mmol/L (ref 22–32)
CREATININE: 0.82 mg/dL (ref 0.44–1.00)
Chloride: 102 mmol/L (ref 101–111)
GFR calc Af Amer: >60 mL/min (ref >60)
GLUCOSE: 82 mg/dL (ref 65–99)
POTASSIUM: 4.4 mmol/L (ref 3.5–5.1)
Sodium: 137 mmol/L (ref 135–145)
TOTAL PROTEIN: 7.2 g/dL (ref 6.5–8.1)

## 2015-08-30 NOTE — H&P (Signed)
Patient ID: Kendra Hoffman, female DOB: 11/25/1955, 60 y.o. MRN: [id number] GYNECOLOGY VISIT  HPI: 60 y.o. Single Caucasian female  G1P0010 with Patient's last menstrual period was 07/28/2008.  here for pre surgery visit. Patient will be having -[place] with pelvic washings- schdeuled for 09/04/15. The patient was noted to have an adnexal mass at her annual exam. U/S revealed a 6.6 cm complex left adnexal mass cw a dermoid. She has a h/o removal of a right dermoid in the past (done via laparotomy).  GYNECOLOGIC HISTORY: Patient's last menstrual period was 07/28/2008. Contraception:postmenopause Menopausal hormone therapy: None   OB History    Gravida Para Term Preterm AB TAB SAB Ectopic Multiple Living   1 0   1     0       Patient Active Problem List   Diagnosis Date Noted  . Medication management 02/07/2013  . Visit for preventive health examination 11/18/2010  . Hx of melanoma of skin   . PERIMENOPAUSAL SYNDROME 12/21/2009  . HAND PAIN, BILATERAL 12/21/2009  . 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 INSOMNIA 10/13/2007  . BACK PAIN 04/26/2007    Past Medical History  Diagnosis Date  . Mood disorder ([place])   . Anemia   . Hx of melanoma of skin 2011    left foot   . Amenorrhea   . Breast nodule 09/2006    no mass    Past Surgical History  Procedure Laterality Date  . Ovarian cyst removal    . Bunionectomy      right  . Melanoma excision  2012    left foot     Current Outpatient Prescriptions  Medication Sig Dispense Refill  . aspirin 325 MG tablet Take 162.5 mg by mouth daily.     . citalopram (CELEXA) 20 MG tablet TAKE ONE TABLET BY MOUTH ONE TIME DAILY (Patient taking differently: Take 20 mg by mouth daily. TAKE ONE TABLET BY MOUTH ONE TIME DAILY) 90 tablet 4  . estradiol (CLIMARA - DOSED IN MG/24 HR)  0.05 mg/24hr patch Place 1 patch (0.05 mg total) onto the skin once a week. 12 patch 4  . medroxyPROGESTERone (PROVERA) 2.5 MG tablet Take 1 tablet (2.5 mg total) by mouth daily. 90 tablet 4   No current facility-administered medications for this visit.     ALLERGIES: Bactrim and Sulfamethoxazole  Family History  Problem Relation Age of Onset  . Macular degeneration Mother   . Hypertension Mother   . Diabetes Father   . Heart attack Father     x 2  . Heart disease Father   . Alcohol abuse Sister   . Alcohol abuse Brother   . Deep vein thrombosis Brother     disable and immoble also nephew  . Ovarian cancer Sister   . Lung cancer    . Colon cancer    Brother with DVT, while ill with complications from ETOH abuse Sister diagnosed with ovarian cancer at 78  Social History   Social History  . Marital Status: Single    Spouse Name: N/A  . Number of Children: N/A  . Years of Education: N/A   Occupational History  . Not on file.   Social History Main Topics  . Smoking status: Never Smoker   . Smokeless tobacco: Never Used  . Alcohol Use: 1.8 oz/week    3 Glasses of wine per week  . Drug Use: No  . Sexual Activity: No   Other Topics Concern  . Not on  file   Social History Narrative   Single   HH of 1   Pets 1 Cat   Neg ets firearms wears seat belts has smoke alarm.    G1   2 jobs       Had smoked a few time per year And stopped totally for 3 years.    Sister d ovarian cancer/ parents ailing declining they live in [place] she is to travel back and forth. Sr. alcoholic   Occasional social alcohol   Down to 40 hours              Review of Systems  Constitutional: Negative.  HENT: Negative.  Eyes: Negative.  Respiratory: Negative.  Cardiovascular: Negative.  Gastrointestinal: Negative.   Genitourinary: Negative.  Musculoskeletal: Negative.  Skin: Negative.  Neurological: Negative.  Endo/Heme/Allergies: Negative.  Psychiatric/Behavioral: Negative.    PHYSICAL EXAMINATION:   BP 108/60 mmHg  Pulse 80  Resp 16  Wt 128 lb (58.06 kg)  LMP 07/28/2008  General appearance: alert, cooperative and appears stated age Neck: no adenopathy, supple, symmetrical, trachea midline and thyroid normal to inspection and palpation Heart: regular rate and rhythm Lungs: CTAB Abdomen: soft, non-tender; bowel sounds normal; no masses, no organomegaly Lungs: CTAB Extremities: normal, atraumatic, no cyanosis Skin: normal color, texture and turgor, no rashes or lesions Lymph: normal cervical supraclavicular and inguinal nodes Neurologic: grossly normal   ASSESSMENT Left ovarian mass cw dermoid, normal CA 125 FH of ovarian cancer Prior laparotomy with removal of right dermoid  PLAN Plan laparoscopic BSO Discussed risks, including: damage to bowel/bladder/ureters/or vessels. Discussed that her risks of adhesions are higher given her prior laparotomy and partial right oophorectomy. Discussed the risk of needing a laparotomy No antibiotics needed   An After Visit Summary was printed and given to the patient.

## 2015-08-31 NOTE — Anesthesia Preprocedure Evaluation (Addendum)
Anesthesia Evaluation  Patient identified by MRN, date of birth, ID band Patient awake    Reviewed: Allergy & Precautions, NPO status , Patient's Chart, lab work & pertinent test results  History of Anesthesia Complications (+) PONV and history of anesthetic complications  Airway Mallampati: II  TM Distance: >3 FB Neck ROM: Full    Dental no notable dental hx. (+) Teeth Intact   Pulmonary neg pulmonary ROS,    Pulmonary exam normal breath sounds clear to auscultation       Cardiovascular negative cardio ROS Normal cardiovascular exam Rhythm:Regular Rate:Normal     Neuro/Psych PSYCHIATRIC DISORDERS Depression negative neurological ROS     GI/Hepatic negative GI ROS, Neg liver ROS,   Endo/Other  negative endocrine ROS  Renal/GU negative Renal ROS  negative genitourinary   Musculoskeletal  (+) Arthritis , Osteoarthritis,    Abdominal   Peds negative pediatric ROS (+)  Hematology  (+) anemia , 14/40   Anesthesia Other Findings   Reproductive/Obstetrics Left adnexal mass Family Hx/o Ovarian Ca                            Anesthesia Physical Anesthesia Plan  ASA: II  Anesthesia Plan: General   Post-op Pain Management:    Induction: Intravenous  Airway Management Planned: Oral ETT  Additional Equipment:   Intra-op Plan:   Post-operative Plan: Extubation in OR  Informed Consent: I have reviewed the patients History and Physical, chart, labs and discussed the procedure including the risks, benefits and alternatives for the proposed anesthesia with the patient or authorized representative who has indicated his/her understanding and acceptance.   Dental advisory given  Plan Discussed with: CRNA, Anesthesiologist and Surgeon  Anesthesia Plan Comments:        Anesthesia Quick Evaluation

## 2015-09-04 ENCOUNTER — Ambulatory Visit (HOSPITAL_COMMUNITY): Payer: BLUE CROSS/BLUE SHIELD | Admitting: Anesthesiology

## 2015-09-04 ENCOUNTER — Encounter (HOSPITAL_COMMUNITY): Payer: Self-pay | Admitting: Anesthesiology

## 2015-09-04 ENCOUNTER — Ambulatory Visit (HOSPITAL_COMMUNITY)
Admission: RE | Admit: 2015-09-04 | Discharge: 2015-09-04 | Disposition: A | Discharge status: Home / Self Care | Payer: BLUE CROSS/BLUE SHIELD | Source: Ambulatory Visit | Attending: Obstetrics and Gynecology | Admitting: Obstetrics and Gynecology

## 2015-09-04 ENCOUNTER — Telehealth: Payer: Self-pay | Admitting: Obstetrics and Gynecology

## 2015-09-04 ENCOUNTER — Encounter (HOSPITAL_COMMUNITY)
Admission: RE | Disposition: A | Discharge status: Home / Self Care | Payer: Self-pay | Source: Ambulatory Visit | Attending: Obstetrics and Gynecology

## 2015-09-04 DIAGNOSIS — Z8041 Family history of malignant neoplasm of ovary: Secondary | ICD-10-CM | POA: Insufficient documentation

## 2015-09-04 DIAGNOSIS — Z7982 Long term (current) use of aspirin: Secondary | ICD-10-CM | POA: Diagnosis not present

## 2015-09-04 DIAGNOSIS — Z87891 Personal history of nicotine dependence: Secondary | ICD-10-CM | POA: Diagnosis not present

## 2015-09-04 DIAGNOSIS — N83202 Unspecified ovarian cyst, left side: Secondary | ICD-10-CM | POA: Diagnosis not present

## 2015-09-04 DIAGNOSIS — D271 Benign neoplasm of left ovary: Secondary | ICD-10-CM | POA: Insufficient documentation

## 2015-09-04 HISTORY — PX: LAPAROSCOPIC BILATERAL SALPINGO OOPHERECTOMY: SHX5890

## 2015-09-04 SURGERY — SALPINGO-OOPHORECTOMY, BILATERAL, LAPAROSCOPIC
Anesthesia: General | Site: Abdomen | Laterality: Bilateral

## 2015-09-04 MED ORDER — LIDOCAINE HCL (CARDIAC) 20 MG/ML IV SOLN
INTRAVENOUS | Status: DC | PRN
  Administered 2015-09-04: 60 mg via INTRAVENOUS

## 2015-09-04 MED ORDER — LACTATED RINGERS IR SOLN
Status: DC | PRN
  Administered 2015-09-04: 3000 mL

## 2015-09-04 MED ORDER — FENTANYL CITRATE (PF) 100 MCG/2ML IJ SOLN: 25.0000 ug | INTRAMUSCULAR | Status: DC | PRN

## 2015-09-04 MED ORDER — PROPOFOL 10 MG/ML IV BOLUS
INTRAVENOUS | Status: DC | PRN
  Administered 2015-09-04: 120 mg via INTRAVENOUS
  Administered 2015-09-04: 40 mg via INTRAVENOUS

## 2015-09-04 MED ORDER — GLYCOPYRROLATE 0.2 MG/ML IJ SOLN
INTRAMUSCULAR | Status: DC | PRN
  Administered 2015-09-04: 0.4 mg via INTRAVENOUS

## 2015-09-04 MED ORDER — BUPIVACAINE HCL (PF) 0.25 % IJ SOLN
INTRAMUSCULAR | Status: DC | PRN
  Administered 2015-09-04: 10 mL

## 2015-09-04 MED ORDER — PHENYLEPHRINE 40 MCG/ML (10ML) SYRINGE FOR IV PUSH (FOR BLOOD PRESSURE SUPPORT)
PREFILLED_SYRINGE | INTRAVENOUS | Status: AC
  Filled 2015-09-04: qty 10

## 2015-09-04 MED ORDER — LACTATED RINGERS IV SOLN
INTRAVENOUS | Status: DC
  Administered 2015-09-04: 06:00:00 via INTRAVENOUS

## 2015-09-04 MED ORDER — DEXAMETHASONE SODIUM PHOSPHATE 4 MG/ML IJ SOLN
INTRAMUSCULAR | Status: AC
  Filled 2015-09-04: qty 1

## 2015-09-04 MED ORDER — DIPHENHYDRAMINE HCL 50 MG/ML IJ SOLN
INTRAMUSCULAR | Status: AC
  Filled 2015-09-04: qty 1

## 2015-09-04 MED ORDER — MEPERIDINE HCL 25 MG/ML IJ SOLN: 6.2500 mg | INTRAMUSCULAR | Status: DC | PRN

## 2015-09-04 MED ORDER — SCOPOLAMINE 1 MG/3DAYS TD PT72
1.0000 | MEDICATED_PATCH | TRANSDERMAL | Status: DC
  Administered 2015-09-04: 1.5 mg via TRANSDERMAL

## 2015-09-04 MED ORDER — IBUPROFEN 800 MG PO TABS: 800.0000 mg | ORAL_TABLET | Freq: Three times a day (TID) | ORAL | Status: DC | PRN

## 2015-09-04 MED ORDER — ONDANSETRON HCL 4 MG/2ML IJ SOLN
INTRAMUSCULAR | Status: DC | PRN
  Administered 2015-09-04: 4 mg via INTRAVENOUS

## 2015-09-04 MED ORDER — ONDANSETRON HCL 4 MG/2ML IJ SOLN
INTRAMUSCULAR | Status: AC
  Filled 2015-09-04: qty 2

## 2015-09-04 MED ORDER — FENTANYL CITRATE (PF) 250 MCG/5ML IJ SOLN
INTRAMUSCULAR | Status: AC
  Filled 2015-09-04: qty 5

## 2015-09-04 MED ORDER — KETOROLAC TROMETHAMINE 30 MG/ML IJ SOLN
INTRAMUSCULAR | Status: DC | PRN
  Administered 2015-09-04: 30 mg via INTRAVENOUS

## 2015-09-04 MED ORDER — ROCURONIUM BROMIDE 100 MG/10ML IV SOLN
INTRAVENOUS | Status: AC
  Filled 2015-09-04: qty 1

## 2015-09-04 MED ORDER — NEOSTIGMINE METHYLSULFATE 10 MG/10ML IV SOLN
INTRAVENOUS | Status: AC
  Filled 2015-09-04: qty 1

## 2015-09-04 MED ORDER — OXYCODONE-ACETAMINOPHEN 5-325 MG PO TABS: 2.0000 | ORAL_TABLET | ORAL | Status: DC | PRN

## 2015-09-04 MED ORDER — DEXAMETHASONE SODIUM PHOSPHATE 10 MG/ML IJ SOLN
INTRAMUSCULAR | Status: DC | PRN
  Administered 2015-09-04: 4 mg via INTRAVENOUS

## 2015-09-04 MED ORDER — PHENYLEPHRINE HCL 10 MG/ML IJ SOLN
INTRAMUSCULAR | Status: DC | PRN
  Administered 2015-09-04 (×3): 40 ug via INTRAVENOUS

## 2015-09-04 MED ORDER — PROMETHAZINE HCL 25 MG/ML IJ SOLN: 6.2500 mg | INTRAMUSCULAR | Status: DC | PRN

## 2015-09-04 MED ORDER — FENTANYL CITRATE (PF) 100 MCG/2ML IJ SOLN
INTRAMUSCULAR | Status: DC | PRN
  Administered 2015-09-04: 100 ug via INTRAVENOUS
  Administered 2015-09-04: 50 ug via INTRAVENOUS

## 2015-09-04 MED ORDER — BUPIVACAINE-EPINEPHRINE (PF) 0.25% -1:200000 IJ SOLN
INTRAMUSCULAR | Status: AC
Start: 2015-09-04 — End: 2015-09-04
  Filled 2015-09-04: qty 30

## 2015-09-04 MED ORDER — PROPOFOL 10 MG/ML IV BOLUS
INTRAVENOUS | Status: AC
  Filled 2015-09-04: qty 20

## 2015-09-04 MED ORDER — LIDOCAINE HCL (CARDIAC) 20 MG/ML IV SOLN
INTRAVENOUS | Status: AC
  Filled 2015-09-04: qty 5

## 2015-09-04 MED ORDER — DIPHENHYDRAMINE HCL 50 MG/ML IJ SOLN
INTRAMUSCULAR | Status: DC | PRN
  Administered 2015-09-04: 25 mg via INTRAVENOUS

## 2015-09-04 MED ORDER — LACTATED RINGERS IV SOLN
INTRAVENOUS | Status: DC
  Administered 2015-09-04 (×2): via INTRAVENOUS

## 2015-09-04 MED ORDER — ROCURONIUM BROMIDE 100 MG/10ML IV SOLN
INTRAVENOUS | Status: DC | PRN
  Administered 2015-09-04: 35 mg via INTRAVENOUS

## 2015-09-04 MED ORDER — BUPIVACAINE HCL (PF) 0.25 % IJ SOLN
INTRAMUSCULAR | Status: AC
  Filled 2015-09-04: qty 30

## 2015-09-04 MED ORDER — NEOSTIGMINE METHYLSULFATE 10 MG/10ML IV SOLN
INTRAVENOUS | Status: DC | PRN
  Administered 2015-09-04: 3 mg via INTRAVENOUS

## 2015-09-04 MED ORDER — MIDAZOLAM HCL 2 MG/2ML IJ SOLN
INTRAMUSCULAR | Status: DC | PRN
  Administered 2015-09-04: 2 mg via INTRAVENOUS

## 2015-09-04 MED ORDER — SCOPOLAMINE 1 MG/3DAYS TD PT72
MEDICATED_PATCH | TRANSDERMAL | Status: AC
  Administered 2015-09-04: 1.5 mg via TRANSDERMAL
  Filled 2015-09-04: qty 1

## 2015-09-04 MED ORDER — GLYCOPYRROLATE 0.2 MG/ML IJ SOLN
INTRAMUSCULAR | Status: AC
  Filled 2015-09-04: qty 2

## 2015-09-04 MED ORDER — KETOROLAC TROMETHAMINE 30 MG/ML IJ SOLN
INTRAMUSCULAR | Status: AC
  Filled 2015-09-04: qty 1

## 2015-09-04 MED ORDER — MIDAZOLAM HCL 2 MG/2ML IJ SOLN
INTRAMUSCULAR | Status: AC
  Filled 2015-09-04: qty 2

## 2015-09-04 MED ORDER — LACTATED RINGERS IV SOLN: INTRAVENOUS | Status: DC

## 2015-09-04 SURGICAL SUPPLY — 28 items
CABLE HIGH FREQUENCY MONO STRZ (ELECTRODE) IMPLANT
CANISTER SUCT 3000ML (MISCELLANEOUS) ×2 IMPLANT
CHLORAPREP W/TINT 26ML (MISCELLANEOUS) ×2 IMPLANT
CLOTH BEACON ORANGE TIMEOUT ST (SAFETY) ×2 IMPLANT
DILATOR CANAL MILEX (MISCELLANEOUS) ×1 IMPLANT
DRSG COVADERM PLUS 2X2 (GAUZE/BANDAGES/DRESSINGS) ×4 IMPLANT
DRSG OPSITE POSTOP 3X4 (GAUZE/BANDAGES/DRESSINGS) IMPLANT
GLOVE BIO SURGEON STRL SZ 6.5 (GLOVE) ×4 IMPLANT
GLOVE BIOGEL PI IND STRL 7.0 (GLOVE) ×2 IMPLANT
GLOVE BIOGEL PI INDICATOR 7.0 (GLOVE) ×2
GOWN STRL REUS W/TWL LRG LVL3 (GOWN DISPOSABLE) ×6 IMPLANT
LIGASURE BLUNT 5MM 37CM (INSTRUMENTS) ×1 IMPLANT
LIQUID BAND (GAUZE/BANDAGES/DRESSINGS) ×1 IMPLANT
NEEDLE INSUFFLATION 120MM (ENDOMECHANICALS) ×2 IMPLANT
NS IRRIG 1000ML POUR BTL (IV SOLUTION) ×1 IMPLANT
PACK LAPAROSCOPY BASIN (CUSTOM PROCEDURE TRAY) ×2 IMPLANT
PAD TRENDELENBURG POSITION (MISCELLANEOUS) ×2 IMPLANT
SCISSORS LAP 5X35 DISP (ENDOMECHANICALS) IMPLANT
SET CYSTO W/LG BORE CLAMP LF (SET/KITS/TRAYS/PACK) IMPLANT
SET IRRIG TUBING LAPAROSCOPIC (IRRIGATION / IRRIGATOR) ×1 IMPLANT
SHEARS HARMONIC ACE PLUS 36CM (ENDOMECHANICALS) IMPLANT
SLEEVE XCEL OPT CAN 5 100 (ENDOMECHANICALS) ×2 IMPLANT
SUT MNCRL AB 4-0 PS2 18 (SUTURE) ×2 IMPLANT
TOWEL OR 17X24 6PK STRL BLUE (TOWEL DISPOSABLE) ×4 IMPLANT
TRAY FOLEY CATH SILVER 14FR (SET/KITS/TRAYS/PACK) ×2 IMPLANT
TROCAR XCEL DIL TIP R 11M (ENDOMECHANICALS) ×1 IMPLANT
TROCAR XCEL NON-BLD 5MMX100MML (ENDOMECHANICALS) ×2 IMPLANT
WARMER LAPAROSCOPE (MISCELLANEOUS) ×2 IMPLANT

## 2015-09-04 NOTE — Interval H&P Note (Signed)
History and Physical Interval Note:  09/04/2015 7:09 AM  [place]  has presented today for surgery, with the diagnosis of Six cm left adnexal mass, family history ovarian cancer  The various methods of treatment have been discussed with the patient and family. After consideration of risks, benefits and other options for treatment, the patient has consented to  Procedure(s): LAPAROSCOPIC BILATERAL SALPINGO OOPHORECTOMY with pelvic washings (Bilateral) as a surgical intervention .  The patient's history has been reviewed, patient examined, no change in status, stable for surgery.  I have reviewed the patient's chart and labs.  Questions were answered to the patient's satisfaction.     Salvadore Dom

## 2015-09-04 NOTE — Anesthesia Postprocedure Evaluation (Signed)
Anesthesia Post Note  Patient: Kendra Hoffman  Procedure(s) Performed: Procedure(s) (LRB): LAPAROSCOPIC BILATERAL SALPINGO OOPHORECTOMY with pelvic washings (Bilateral)  Patient location during evaluation: PACU Anesthesia Type: General Level of consciousness: awake and alert and oriented Pain management: pain level controlled Vital Signs Assessment: post-procedure vital signs reviewed and stable Respiratory status: spontaneous breathing, nonlabored ventilation and respiratory function stable Cardiovascular status: blood pressure returned to baseline and stable Postop Assessment: no signs of nausea or vomiting Anesthetic complications: no    Last Vitals:  Filed Vitals:   09/04/15 0900 09/04/15 0915  BP: 120/68   Pulse: 53 58  Temp:    Resp: 16 14    Last Pain:  Filed Vitals:   09/04/15 0916  PainSc: 2                  Racine Erby A.

## 2015-09-04 NOTE — Anesthesia Procedure Notes (Signed)
Procedure Name: Intubation Date/Time: 09/04/2015 7:23 AM Performed by: Raenette Rover Pre-anesthesia Checklist: Patient identified, Emergency Drugs available, Suction available and Patient being monitored Patient Re-evaluated:Patient Re-evaluated prior to inductionOxygen Delivery Method: Circle system utilized Preoxygenation: Pre-oxygenation with 100% oxygen Intubation Type: IV induction Ventilation: Mask ventilation without difficulty Laryngoscope Size: Mac and 3 Grade View: Grade I Tube type: Oral Tube size: 7.0 mm Number of attempts: 1 Airway Equipment and Method: Stylet Placement Confirmation: ETT inserted through vocal cords under direct vision,  positive ETCO2,  CO2 detector and breath sounds checked- equal and bilateral Secured at: 21 cm Tube secured with: Tape Dental Injury: Teeth and Oropharynx as per pre-operative assessment

## 2015-09-04 NOTE — Op Note (Signed)
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  Surgeon: Dr Sumner Boast  Assistant: Dr Edwinna Areola  Anesthesia: General  EBL: 10  Fluids: 1,200 LR  Urine output: 25 cc  Complications: none  Indications for surgery: The patient is a 60 year old female, who was noted to have a left adnexal mass at an annual exam. She has a family history of ovarian cancer. Work up included an ultrasound that showed a complex left ovarian cyst, consistent with a dermoid.  The patient is aware of the risks and complications involved with the surgery and consent was obtained prior to the procedure.  Findings: EUA: left adnexal mass noted, normal sized uterus and right adnexa.  Laparoscopy: normal uterus and right adnexa, enlarged left ovary, normal left tube. Ureters identified bilaterally. On removal of the ovarian cyst via endo bag the contents were consistent with a dermoid.   Procedure: The patient was taken to the operating room with an IV in placed. She was placed in the dorsal lithotomy position. General anesthesia was administered. She was prepped and draped in the usual sterile fashion for an abdominal, vaginal surgery. The cervix was dilated with the Hagar dilators and a Hulka tenaculum was placed. A foley catheter was placed.   The umbilicus was everted, injected with 0.25% marcaine and incised with a # 15 blade. 2 towel clips were used to elevated the umbilicus and a veress needle was placed into the abdominal cavity. The abdominal cavity was insufflated with CO2, with normal intraabdominal pressures. After adequate pneumo-insufflation the veress needle was removed and the 5 mm laparoscope was placed into the abdominal cavity using the opti-view trocar. The patient was placed in trendelenburg and the  abdominal pelvic cavity was inspected. 2 more trocars were placed. 1 in each lower quadrant approximately 3 cm medial to the anterior superior iliac spine. These areas were injected with 0.25% marcaine, incised with a #15 blade and a 5 mm trocars was inserted in the LLQ and a 11 mm trocar was inserted into the RLQ, both with direct visualization with the laparoscope.  After identifying the ureters, the right infundibulopelvic ligament was elevated from the pelvic sidewall, cauterized and cut with the ligasure device. The mesosalpinx and mesoovarium were cauterized and cut with the ligasure device. The tube and uteroovarian ligament were separated from the uterus using the ligasure device and removed through the 11 mm trocar. The same procedure was repeated on the left up until the point of removing the adnexa. The endobag was placed in the abdomen and the adnexa was placed in the bag and brought up through the 11 mm trocar site. The ovary was punctured with a blade and mostly drained with suction. The majority of the adnexa was pulled out from the bag. An approximately 1.5 cm firm nodule remained in the bag. With attempted removal of this nodule the bag ruptured. The 11 mm trocar was replaced and a new bag was used to remove the remaining nodule. Prior to placing the trocar the fascial incision was slightly enlarged. The trocar, bag and nodule were all removed together.   There was a small amount of sebaceous material in the abdomen. The abdomen was copiously irrigated and suctioned with no remaining sebaceous material noted.    The abdominal pressure was released and the surgical site remained hemostatic.   The [employer] device  was used to place a stich of 0-Vicryl through the fascia of the 11 mm trocar site. The abdominal cavity was desufflated and the trocars were removed. The skin was closed with subcuticular stiches of 4-0 vicryl and dermabond was placed over the incisions. Only dermabond was used  on the umbilical incision.   The foley catheter and Hulka tenaculum were removed.  The patient's abdomen and perineum were cleansed and she was taken out of the dorsal lithotomy position. Upon awakening she was extubated and taken to the recovery room in stable condition. The sponge and instrument counts were correct.

## 2015-09-04 NOTE — Discharge Instructions (Signed)

## 2015-09-04 NOTE — Transfer of Care (Signed)
Immediate Anesthesia Transfer of Care Note  Patient: [name]  Procedure(s) Performed: Procedure(s): LAPAROSCOPIC BILATERAL SALPINGO OOPHORECTOMY with pelvic washings (Bilateral)  Patient Location: PACU  Anesthesia Type:General  Level of Consciousness: awake, alert , oriented and patient cooperative  Airway & Oxygen Therapy: Patient Spontanous Breathing and Patient connected to nasal cannula oxygen  Post-op Assessment: Report given to RN and Post -op Vital signs reviewed and stable  Post vital signs: Reviewed and stable  Last Vitals:  Filed Vitals:   09/04/15 0600  BP: 134/68  Pulse: 71  Temp: 36.9 C  Resp: 20    Complications: No apparent anesthesia complications

## 2015-09-04 NOTE — Telephone Encounter (Signed)
Called patient to check on her. Left a message, I'll call back tomorrow

## 2015-09-05 ENCOUNTER — Encounter (HOSPITAL_COMMUNITY): Payer: Self-pay | Admitting: Obstetrics and Gynecology

## 2015-09-05 NOTE — Telephone Encounter (Signed)
Called patient to check on her, she is doing well. Minimal pain, not needing the percocet.

## 2015-09-17 ENCOUNTER — Encounter: Payer: Self-pay | Admitting: Obstetrics and Gynecology

## 2015-09-17 ENCOUNTER — Ambulatory Visit (INDEPENDENT_AMBULATORY_CARE_PROVIDER_SITE_OTHER): Payer: BLUE CROSS/BLUE SHIELD | Admitting: Obstetrics and Gynecology

## 2015-09-17 VITALS — BP 102/58 | HR 64 | Resp 14 | Wt 126.0 lb

## 2015-09-17 DIAGNOSIS — Z9889 Other specified postprocedural states: Secondary | ICD-10-CM

## 2015-09-17 NOTE — Progress Notes (Signed)
Patient ID: Kendra Hoffman, female   DOB: [birth year], 60 y.o.   MRN: [id number] GYNECOLOGY  VISIT   HPI: 60 y.o.   Single  Caucasian  female   G1P0010 with Patient's last menstrual period was 07/28/2008.   here 13 days post opt  LAPAROSCOPIC BILATERAL SALPINGO OOPHORECTOMY with pelvic washings. She had a left ovarian dermoid, all benign pathology. She is feeling good. Slightly tender around the left lower incision. No bowel or bladder function. Some spotting after the surgery, no further bleeding.   GYNECOLOGIC HISTORY: Patient's last menstrual period was 07/28/2008. Contraception:post menopause Menopausal hormone therapy: estradiol and progesterone         OB History    Gravida Para Term Preterm AB TAB SAB Ectopic Multiple Living   1 0   1     0         Patient Active Problem List   Diagnosis Date Noted  . Medication management 02/07/2013  . Visit for preventive health examination 11/18/2010  . Hx of melanoma of skin   . PERIMENOPAUSAL SYNDROME 12/21/2009  . HAND PAIN, BILATERAL 12/21/2009  . 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 INSOMNIA 10/13/2007  . BACK PAIN 04/26/2007    Past Medical History  Diagnosis Date  . Mood disorder ([place])   . Hx of melanoma of skin 2011    left foot   . Amenorrhea   . Breast nodule 09/2006    no mass  . Arthritis     bilateral hands  . Depression   . Cancer ([place])     melanoma  . Anemia     history of  . PONV (postoperative nausea and vomiting)     Past Surgical History  Procedure Laterality Date  . Ovarian cyst removal    . Bunionectomy      right  . Melanoma excision  2012    left foot   . Colonoscopy    . Laparoscopic bilateral salpingo oopherectomy Bilateral 09/04/2015    Procedure: LAPAROSCOPIC BILATERAL SALPINGO OOPHORECTOMY with pelvic washings;  Surgeon: Salvadore Dom, MD;  Location: [facility];  Service: Gynecology;  Laterality: Bilateral;    Current Outpatient Prescriptions  Medication Sig Dispense Refill  . citalopram (CELEXA) 20  MG tablet TAKE ONE TABLET BY MOUTH ONE TIME DAILY (Patient taking differently: Take 20 mg by mouth daily. TAKE ONE TABLET BY MOUTH ONE TIME DAILY) 90 tablet 4  . estradiol (CLIMARA - DOSED IN MG/24 HR) 0.05 mg/24hr patch Place 1 patch (0.05 mg total) onto the skin once a week. 12 patch 4  . medroxyPROGESTERone (PROVERA) 2.5 MG tablet Take 1 tablet (2.5 mg total) by mouth daily. 90 tablet 4   No current facility-administered medications for this visit.     ALLERGIES: Bactrim and Sulfamethoxazole  Family History  Problem Relation Age of Onset  . Macular degeneration Mother   . Hypertension Mother   . Diabetes Father   . Heart attack Father     x 2  . Heart disease Father   . Alcohol abuse Sister   . Alcohol abuse Brother   . Deep vein thrombosis Brother     disable and immoble also nephew  . Ovarian cancer Sister   . Lung cancer    . Colon cancer      Social History   Social History  . Marital Status: Single    Spouse Name: N/A  . Number of Children: N/A  . Years of Education: N/A   Occupational History  . Not  on file.   Social History Main Topics  . Smoking status: Never Smoker   . Smokeless tobacco: Never Used  . Alcohol Use: 1.8 oz/week    3 Glasses of wine per week  . Drug Use: No  . Sexual Activity: No   Other Topics Concern  . Not on file   Social History Narrative   Single   HH of 1   Pets 1 Cat   Neg ets firearms wears seat belts has smoke alarm.    G1    2 jobs       Had smoked a few time per year  And stopped totally for 3 years.    Sister d  ovarian cancer/ parents ailing declining they live in [place] she is to travel back and forth. Sr. alcoholic   Occasional social alcohol   Down to 40 hours              Review of Systems  Constitutional: Negative.   HENT: Negative.   Eyes: Negative.   Respiratory: Negative.   Cardiovascular: Negative.   Gastrointestinal: Negative.   Genitourinary: Negative.   Musculoskeletal: Negative.   Skin:  Negative.   Neurological: Negative.   Endo/Heme/Allergies: Negative.   Psychiatric/Behavioral: Negative.     PHYSICAL EXAMINATION:    BP 102/58 mmHg  Pulse 64  Resp 14  Wt 126 lb (57.153 kg)  LMP 07/28/2008    General appearance: alert, cooperative and appears stated age Abdomen: soft, non-tender; bowel sounds normal; no masses,  no organomegaly. Incisions are well healed.   Pelvic: External genitalia:  no lesions              Bimanual Exam:  Uterus:  normal size, contour, position, consistency, mobility, non-tender              Adnexa: no mass, fullness, tenderness                ASSESSMENT 13 days post op, s/p laparoscopic BSO, benign pathology (dermoid on the left). Doing well    PLAN Routine f/u   An After Visit Summary was printed and given to the patient.

## 2015-09-21 ENCOUNTER — Telehealth: Payer: Self-pay | Admitting: Obstetrics and Gynecology

## 2015-09-21 NOTE — Telephone Encounter (Signed)
Patient needs a note dated from 09/18/2015 saying that she can go back to work and patient says it can be mailed to her. Best # to reach: [phone]

## 2015-09-21 NOTE — Telephone Encounter (Signed)
Return to work letter written and to Dr.Jertson's desk for review and advise prior to sending.

## 2015-09-24 NOTE — Telephone Encounter (Signed)
Spoke with patient. Advised letter has been written and signed by Dr.Jertson for return to work. She is agreeable and would like this letter mailed to her home address on file which was verified. Letter placed in the mail to verified address on file.   Routing to provider for final review. Patient agreeable to disposition. Will close encounter.

## 2016-06-05 ENCOUNTER — Encounter: Payer: Self-pay | Admitting: Obstetrics and Gynecology

## 2016-08-04 ENCOUNTER — Ambulatory Visit (INDEPENDENT_AMBULATORY_CARE_PROVIDER_SITE_OTHER): Payer: BLUE CROSS/BLUE SHIELD | Admitting: Obstetrics and Gynecology

## 2016-08-04 ENCOUNTER — Encounter: Payer: Self-pay | Admitting: Obstetrics and Gynecology

## 2016-08-04 VITALS — BP 122/60 | HR 80 | Resp 14 | Ht 64.25 in | Wt 130.0 lb

## 2016-08-04 DIAGNOSIS — Z0000 Encounter for general adult medical examination without abnormal findings: Secondary | ICD-10-CM | POA: Diagnosis not present

## 2016-08-04 DIAGNOSIS — N950 Postmenopausal bleeding: Secondary | ICD-10-CM

## 2016-08-04 DIAGNOSIS — Z01419 Encounter for gynecological examination (general) (routine) without abnormal findings: Secondary | ICD-10-CM

## 2016-08-04 DIAGNOSIS — R350 Frequency of micturition: Secondary | ICD-10-CM | POA: Diagnosis not present

## 2016-08-04 LAB — POCT URINALYSIS DIPSTICK
Bilirubin, UA: NEGATIVE
Blood, UA: NEGATIVE
Glucose, UA: NEGATIVE
Ketones, UA: NEGATIVE
LEUKOCYTES UA: NEGATIVE
Nitrite, UA: NEGATIVE
Protein, UA: NEGATIVE
UROBILINOGEN UA: NEGATIVE
pH, UA: 7

## 2016-08-04 MED ORDER — CITALOPRAM HYDROBROMIDE 20 MG PO TABS: ORAL_TABLET | ORAL | 4 refills | Status: DC

## 2016-08-04 MED ORDER — ESTRADIOL 0.0375 MG/24HR TD PTWK: 0.0375 mg | MEDICATED_PATCH | TRANSDERMAL | 4 refills | Status: DC

## 2016-08-04 MED ORDER — MEDROXYPROGESTERONE ACETATE 2.5 MG PO TABS: 2.5000 mg | ORAL_TABLET | Freq: Every day | ORAL | 4 refills | Status: DC

## 2016-08-04 NOTE — Patient Instructions (Signed)

## 2016-08-04 NOTE — Progress Notes (Signed)
61 y.o. G1P0010 SingleCaucasianF here for annual exam.  No vaginal bleeding, not sexually active.  She had a laparoscopic BSO last year, benign pathology. She is on HRT, willing to try to wean off.  Some tolerable vasomotor symptoms.  Under stress, 33 year old parents live in [place] with her alcoholic sister (doesn't help). The patient has to manage their care from here. One sister died with ovarian cancer, her brother died from alcoholism related issues.     Patient's last menstrual period was 07/28/2008.          Sexually active: No  The current method of family planning is post menopausal status.    Exercising: No.  The patient does not participate in regular exercise at present. Smoker:  no  Health Maintenance: Pap:  07-25-15 WNL  History of abnormal Pap: yes years ago 05-10-13 WNL NEG HR HPV History of abnormal Pap:  Yes, h/o cryosurgery in her 20's MMG:  05-12-16 WNL  Colonoscopy:  01-19-12 WNL  BMD:  Age 44- normal per patient  TDaP:  11-18-10 Gardasil: N/A   reports that she has never smoked. She has never used smokeless tobacco. She reports that she drinks about 1.8 oz of alcohol per week . She reports that she does not use drugs.  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  Past Medical History:  Diagnosis Date  . Amenorrhea   . Anemia    history of  . Arthritis    bilateral hands  . Breast nodule 09/2006   no mass  . Cancer ([place])    melanoma  . Depression   . Hx of melanoma of skin 2011   left foot   . Mood disorder ([place])   . PONV (postoperative nausea and vomiting)     Past Surgical History:  Procedure Laterality Date  . BUNIONECTOMY     right  . COLONOSCOPY    . LAPAROSCOPIC BILATERAL SALPINGO OOPHERECTOMY Bilateral 09/04/2015   Procedure: LAPAROSCOPIC BILATERAL SALPINGO OOPHORECTOMY with pelvic washings;  Surgeon: Salvadore Dom, MD;  Location: [facility];  Service: Gynecology;  Laterality: Bilateral;  . MELANOMA EXCISION  2012   left foot   . OVARIAN CYST  REMOVAL      Current Outpatient Prescriptions  Medication Sig Dispense Refill  . aspirin EC 81 MG tablet Take 81 mg by mouth daily.    . citalopram (CELEXA) 20 MG tablet TAKE ONE TABLET BY MOUTH ONE TIME DAILY (Patient taking differently: Take 20 mg by mouth daily. TAKE ONE TABLET BY MOUTH ONE TIME DAILY) 90 tablet 4  . estradiol (CLIMARA - DOSED IN MG/24 HR) 0.05 mg/24hr patch Place 1 patch (0.05 mg total) onto the skin once a week. 12 patch 4  . medroxyPROGESTERone (PROVERA) 2.5 MG tablet Take 1 tablet (2.5 mg total) by mouth daily. 90 tablet 4   No current facility-administered medications for this visit.     Family History  Problem Relation Age of Onset  . Macular degeneration Mother   . Hypertension Mother   . Diabetes Father   . Heart attack Father     x 2  . Heart disease Father   . Alcohol abuse Sister   . Alcohol abuse Brother   . Deep vein thrombosis Brother     disable and immoble also nephew  . Ovarian cancer Sister   . Lung cancer    . Colon cancer      Review of Systems  Constitutional: Negative.   HENT: Negative.   Eyes: Negative.  Respiratory: Negative.   Cardiovascular: Negative.   Gastrointestinal: Negative.   Endocrine: Negative.   Genitourinary: Positive for frequency.  Musculoskeletal: Negative.   Skin: Negative.   Allergic/Immunologic: Negative.   Neurological: Negative.   Hematological: Negative.   Psychiatric/Behavioral: Negative.   Urinary frequency is long term, getting up 2-3 x a night to void, drinks water prior to bed. Voids normal amounts.   Exam:   BP 122/60 (BP Location: Right Arm, Patient Position: Sitting, Cuff Size: Normal)   Pulse 80   Resp 14   Ht 5' 4.25" (1.632 m)   Wt 130 lb (59 kg)   LMP 07/28/2008   BMI 22.14 kg/m   Weight change: @WEIGHTCHANGE @ Height:   Height: 5' 4.25" (163.2 cm)  Ht Readings from Last 3 Encounters:  08/04/16 5' 4.25" (1.632 m)  08/24/15 5' 4.5" (1.638 m)  07/25/15 5' 4.5" (1.638 m)    General  appearance: alert, cooperative and appears stated age Head: Normocephalic, without obvious abnormality, atraumatic Neck: no adenopathy, supple, symmetrical, trachea midline and thyroid normal to inspection and palpation Lungs: clear to auscultation bilaterally Cardiovascular: regular rate and rhythm Breasts: normal appearance, no masses or tenderness Heart: regular rate and rhythm Abdomen: soft, non-tender; bowel sounds normal; no masses,  no organomegaly Extremities: extremities normal, atraumatic, no cyanosis or edema Skin: Skin color, texture, turgor normal. No rashes or lesions Lymph nodes: Cervical, supraclavicular, and axillary nodes normal. No abnormal inguinal nodes palpated Neurologic: Grossly normal   Pelvic: External genitalia:  no lesions              Urethra:  normal appearing urethra with no masses, tenderness or lesions              Bartholins and Skenes: normal                 Vagina: normal appearing vagina with normal color a small amount of brown blood seen in the vagina              Cervix: no lesions, some blood tinged mucous.               Bimanual Exam:  Uterus:  normal size, contour, position, consistency, mobility, non-tender              Adnexa: no mass, fullness, tenderness               Rectovaginal: Confirms               Anus:  normal sphincter tone, no lesions  Chaperone was present for exam.  A:  Well Woman with normal exam  HRT, willing to decrease the dose  Blood seen on exam, PMP bleeding (patient hadn't noticed)  Urinary frequency, no change, normal urine dip  P:   No pap this year  Mammogram UTD  Colonoscopy UTD  Will decrease to Climara 0.0375 mg, continue daily provera (she will call if she wants to decrease her dose of Climara)  Continue Celexa, she may try halving the dose  Discussed breast self exam  Discussed calcium and vit D intake  Return for a GYN ultrasound, possible sonohysterogram, possible biopsy

## 2016-08-19 ENCOUNTER — Other Ambulatory Visit: Payer: Self-pay | Admitting: Obstetrics and Gynecology

## 2016-08-19 ENCOUNTER — Ambulatory Visit (INDEPENDENT_AMBULATORY_CARE_PROVIDER_SITE_OTHER): Payer: BLUE CROSS/BLUE SHIELD | Admitting: Obstetrics and Gynecology

## 2016-08-19 ENCOUNTER — Ambulatory Visit (INDEPENDENT_AMBULATORY_CARE_PROVIDER_SITE_OTHER): Payer: BLUE CROSS/BLUE SHIELD

## 2016-08-19 ENCOUNTER — Encounter: Payer: Self-pay | Admitting: Obstetrics and Gynecology

## 2016-08-19 VITALS — BP 118/78 | HR 60 | Resp 12 | Ht 64.25 in | Wt 130.0 lb

## 2016-08-19 DIAGNOSIS — N950 Postmenopausal bleeding: Secondary | ICD-10-CM

## 2016-08-19 DIAGNOSIS — Z124 Encounter for screening for malignant neoplasm of cervix: Secondary | ICD-10-CM

## 2016-08-19 DIAGNOSIS — Z79890 Hormone replacement therapy (postmenopausal): Secondary | ICD-10-CM | POA: Diagnosis not present

## 2016-08-19 NOTE — Progress Notes (Signed)
GYNECOLOGY  VISIT   HPI: 61 y.o.   Single  Caucasian  female   G1P0010 with Patient's last menstrual period was 07/28/2008.   here for ultrasound The patient is s/p BSO last year for benign indications. She is on HRT. At her recent annual exam she was noted to have a small amount of blood in her vagina and a small amount of blood tinged mucous. She denies ever seeing any blood.   GYNECOLOGIC HISTORY: Patient's last menstrual period was 07/28/2008. Contraception: postmenopause Menopausal hormone therapy: climara/provera        OB History    Gravida Para Term Preterm AB Living   1 0     1 0   SAB TAB Ectopic Multiple Live Births                     Patient Active Problem List   Diagnosis Date Noted  . Medication management 02/07/2013  . Visit for preventive health examination 11/18/2010  . Hx of melanoma of skin   . PERIMENOPAUSAL SYNDROME 12/21/2009  . HAND PAIN, BILATERAL 12/21/2009  . 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 INSOMNIA 10/13/2007  . BACK PAIN 04/26/2007    Past Medical History:  Diagnosis Date  . Amenorrhea   . Anemia    history of  . Arthritis    bilateral hands  . Breast nodule 09/2006   no mass  . Cancer ([place])    melanoma  . Depression   . Hx of melanoma of skin 2011   left foot   . Mood disorder ([place])   . PONV (postoperative nausea and vomiting)     Past Surgical History:  Procedure Laterality Date  . BUNIONECTOMY     right  . COLONOSCOPY    . LAPAROSCOPIC BILATERAL SALPINGO OOPHERECTOMY Bilateral 09/04/2015   Procedure: LAPAROSCOPIC BILATERAL SALPINGO OOPHORECTOMY with pelvic washings;  Surgeon: Salvadore Dom, MD;  Location: [facility];  Service: Gynecology;  Laterality: Bilateral;  . MELANOMA EXCISION  2012   left foot   . OVARIAN CYST REMOVAL      Current Outpatient Prescriptions  Medication Sig Dispense Refill  . aspirin EC 81 MG tablet Take 81 mg by mouth daily.    . citalopram (CELEXA) 20 MG tablet TAKE ONE TABLET BY MOUTH ONE TIME DAILY 90 tablet 4   . estradiol (CLIMARA) 0.0375 mg/24hr patch Place 1 patch (0.0375 mg total) onto the skin once a week. 12 patch 4  . medroxyPROGESTERone (PROVERA) 2.5 MG tablet Take 1 tablet (2.5 mg total) by mouth daily. 90 tablet 4   No current facility-administered medications for this visit.      ALLERGIES: Bactrim [sulfamethoxazole-trimethoprim] and Sulfamethoxazole  Family History  Problem Relation Age of Onset  . Macular degeneration Mother   . Hypertension Mother   . Diabetes Father   . Heart attack Father     x 2  . Heart disease Father   . Alcohol abuse Sister   . Alcohol abuse Brother   . Deep vein thrombosis Brother     disable and immoble also nephew  . Ovarian cancer Sister   . Lung cancer    . Colon cancer      Social History   Social History  . Marital status: Single    Spouse name: N/A  . Number of children: N/A  . Years of education: N/A   Occupational History  . Not on file.   Social History Main Topics  . Smoking status: Never Smoker  .  Smokeless tobacco: Never Used  . Alcohol use 1.8 oz/week    3 Glasses of wine per week  . Drug use: No  . Sexual activity: No   Other Topics Concern  . Not on file   Social History Narrative   Single   HH of 1   Pets 1 Cat   Neg ets firearms wears seat belts has smoke alarm.    G1    2 jobs       Had smoked a few time per year  And stopped totally for 3 years.    Sister d  ovarian cancer/ parents ailing declining they live in [place] she is to travel back and forth. Sr. alcoholic   Occasional social alcohol   Down to 40 hours              ROS  PHYSICAL EXAMINATION:    BP 118/78 (BP Location: Right Arm, Patient Position: Sitting, Cuff Size: Normal)   Pulse 60   Resp 12   Ht 5' 4.25" (1.632 m)   Wt 130 lb (59 kg)   LMP 07/28/2008   BMI 22.14 kg/m     General appearance: alert, cooperative and appears stated age  Pelvic: External genitalia:  no lesions              Urethra:  normal appearing urethra  with no masses, tenderness or lesions              Bartholins and Skenes: normal                 Vagina: normal appearing vagina with normal color and discharge, no lesions              Cervix: no lesions              Sonohysterogram The procedure and risks of the procedure were reviewed with the patient, consent form was signed. A speculum was placed in the vagina and the cervix was cleansed with betadine. The sonohysterogram catheter was inserted into the uterine cavity without difficulty. Saline was infused under direct observation with the ultrasound. No intracavitary defects were noted. On some images the posterior uterine wall appeared slightly thickened on others it did not, ? Artifact. Vs polypoid endometrium.  No cervical lesions. The catheter was removed.   The risks of endometrial biopsy were reviewed and a consent was obtained.  A speculum was placed in the vagina and the cervix was cleansed with betadine. The mini-pipelle was placed into the endometrial cavity. The uterus sounded to 6-7 cm. The endometrial biopsy was performed, minimal to moderate tissue was obtained. The tenaculum and speculum were removed. There were no complications.     Chaperone was present for exam.  Ultrasound images reviewed with the patient  ASSESSMENT Episode of PMP spotting, only noted on exam. U/S with slightly thickened endometrial stripe. Sonohysterogram with possible asymmetric thickening, not seen on all images (possible artifact).    PLAN Endometrial biopsy done Pap done If the biopsy is benign will not further intervene at this point. If she has any further bleeding, would recommend hysteroscopy, dilation and curettage   An After Visit Summary was printed and given to the patient.

## 2016-08-19 NOTE — Patient Instructions (Signed)

## 2016-08-20 ENCOUNTER — Other Ambulatory Visit: Payer: Self-pay | Admitting: Obstetrics and Gynecology

## 2016-08-25 LAB — IPS PAP TEST WITH HPV

## 2016-08-28 ENCOUNTER — Other Ambulatory Visit: Payer: Self-pay | Admitting: *Deleted

## 2016-08-28 DIAGNOSIS — N950 Postmenopausal bleeding: Secondary | ICD-10-CM

## 2016-08-28 DIAGNOSIS — R87619 Unspecified abnormal cytological findings in specimens from cervix uteri: Secondary | ICD-10-CM

## 2016-08-29 DIAGNOSIS — Z85820 Personal history of malignant melanoma of skin: Secondary | ICD-10-CM | POA: Insufficient documentation

## 2016-09-04 ENCOUNTER — Encounter: Payer: Self-pay | Admitting: Obstetrics and Gynecology

## 2016-09-04 ENCOUNTER — Ambulatory Visit (INDEPENDENT_AMBULATORY_CARE_PROVIDER_SITE_OTHER): Payer: BLUE CROSS/BLUE SHIELD | Admitting: Obstetrics and Gynecology

## 2016-09-04 VITALS — BP 118/60 | HR 72 | Resp 16 | Wt 131.0 lb

## 2016-09-04 DIAGNOSIS — R87619 Unspecified abnormal cytological findings in specimens from cervix uteri: Secondary | ICD-10-CM | POA: Diagnosis not present

## 2016-09-04 DIAGNOSIS — N950 Postmenopausal bleeding: Secondary | ICD-10-CM | POA: Diagnosis not present

## 2016-09-04 DIAGNOSIS — N882 Stricture and stenosis of cervix uteri: Secondary | ICD-10-CM

## 2016-09-04 NOTE — Progress Notes (Signed)
GYNECOLOGY  VISIT   HPI: 61 y.o.   Single  Caucasian  female   G1P0010 with Patient's last menstrual period was 07/28/2008.   here for colposcopy. Pap with atypical glandular cells, negative hpv on recent pap. The pap was done secondary to PMP bleeding (only noted on exam). Endometrial biopsy with scant inactive endometrium. On sonohysterogram on some views the lining appeared slightly thickened, on others it didn't (?artifact).  She underwent a laparoscopic BSO last year for a adnexal mass and family history of ovarian cancer. Benign pathology.    GYNECOLOGIC HISTORY: Patient's last menstrual period was 07/28/2008. Contraception:postmenopause Menopausal hormone therapy: none         OB History    Gravida Para Term Preterm AB Living   1 0     1 0   SAB TAB Ectopic Multiple Live Births                     Patient Active Problem List   Diagnosis Date Noted  . Medication management 02/07/2013  . Visit for preventive health examination 11/18/2010  . Hx of melanoma of skin   . PERIMENOPAUSAL SYNDROME 12/21/2009  . HAND PAIN, BILATERAL 12/21/2009  . 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 INSOMNIA 10/13/2007  . BACK PAIN 04/26/2007    Past Medical History:  Diagnosis Date  . Amenorrhea   . Anemia    history of  . Arthritis    bilateral hands  . Breast nodule 09/2006   no mass  . Cancer ([place])    melanoma  . Depression   . Hx of melanoma of skin 2011   left foot   . Mood disorder ([place])   . PONV (postoperative nausea and vomiting)     Past Surgical History:  Procedure Laterality Date  . BUNIONECTOMY     right  . COLONOSCOPY    . LAPAROSCOPIC BILATERAL SALPINGO OOPHERECTOMY Bilateral 09/04/2015   Procedure: LAPAROSCOPIC BILATERAL SALPINGO OOPHORECTOMY with pelvic washings;  Surgeon: Salvadore Dom, MD;  Location: [facility];  Service: Gynecology;  Laterality: Bilateral;  . MELANOMA EXCISION  2012   left foot   . OVARIAN CYST REMOVAL      Current Outpatient Prescriptions  Medication  Sig Dispense Refill  . aspirin EC 81 MG tablet Take 81 mg by mouth daily.    . citalopram (CELEXA) 20 MG tablet TAKE ONE TABLET BY MOUTH ONE TIME DAILY 90 tablet 4  . estradiol (CLIMARA) 0.0375 mg/24hr patch Place 1 patch (0.0375 mg total) onto the skin once a week. 12 patch 4  . medroxyPROGESTERone (PROVERA) 2.5 MG tablet Take 1 tablet (2.5 mg total) by mouth daily. 90 tablet 4   No current facility-administered medications for this visit.      ALLERGIES: Bactrim [sulfamethoxazole-trimethoprim] and Sulfamethoxazole  Family History  Problem Relation Age of Onset  . Macular degeneration Mother   . Hypertension Mother   . Diabetes Father   . Heart attack Father     x 2  . Heart disease Father   . Alcohol abuse Sister   . Alcohol abuse Brother   . Deep vein thrombosis Brother     disable and immoble also nephew  . Ovarian cancer Sister   . Lung cancer    . Colon cancer      Social History   Social History  . Marital status: Single    Spouse name: N/A  . Number of children: N/A  . Years of education: N/A   Occupational History  .  Not on file.   Social History Main Topics  . Smoking status: Never Smoker  . Smokeless tobacco: Never Used  . Alcohol use 1.8 oz/week    3 Glasses of wine per week  . Drug use: No  . Sexual activity: No   Other Topics Concern  . Not on file   Social History Narrative   Single   HH of 1   Pets 1 Cat   Neg ets firearms wears seat belts has smoke alarm.    G1    2 jobs       Had smoked a few time per year  And stopped totally for 3 years.    Sister d  ovarian cancer/ parents ailing declining they live in [place] she is to travel back and forth. Sr. alcoholic   Occasional social alcohol   Down to 40 hours              Review of Systems  Constitutional: Negative.   HENT: Negative.   Eyes: Negative.   Respiratory: Negative.   Cardiovascular: Negative.   Gastrointestinal: Negative.   Genitourinary: Negative.   Musculoskeletal:  Negative.   Skin: Negative.   Neurological: Negative.   Endo/Heme/Allergies: Negative.   Psychiatric/Behavioral: Negative.     PHYSICAL EXAMINATION:    BP 118/60 (BP Location: Right Arm, Patient Position: Sitting, Cuff Size: Normal)   Pulse 72   Resp 16   Wt 131 lb (59.4 kg)   LMP 07/28/2008   BMI 22.31 kg/m     General appearance: alert, cooperative and appears stated age  Pelvic: External genitalia:  no lesions              Urethra:  normal appearing urethra with no masses, tenderness or lesions              Bartholins and Skenes: normal                 Vagina: normal appearing vagina with normal color and discharge, no lesions              Cervix: no gross lesions  Colposcopy: unsatisfactory, no aceto-white changes. Negative lugols examination of the cervix and upper vagina. Stenotic cervix. A tenaculum was placed at 12 o'clock. The cervix was dilated with mini-dilators. An ECC was obtained. The tenaculum was removed.   Chaperone was present for exam.  ASSESSMENT Postmenopausal spotting Atypical glandular cells on pap (negative hpv) Inactive endometrium on endometrial biopsy  Possible asymmetric thickening on sonohysterogram    PLAN Colposcopy was unsatisfactory and negative ECC done Discussed the possible need for hysteroscopy, D&C with the patient Further plans after the biopsy return   An After Visit Summary was printed and given to the patient.  In addition to the colposcopy, 10 minutes face to face time of which over 50% was spent in counseling.

## 2016-09-11 ENCOUNTER — Telehealth: Payer: Self-pay | Admitting: Obstetrics and Gynecology

## 2016-09-11 ENCOUNTER — Encounter: Payer: Self-pay | Admitting: *Deleted

## 2016-09-11 NOTE — Telephone Encounter (Signed)
Patient called requesting to speak with Gay Filler to schedule a surgical procedure.  She said she is anxious to get this scheduled.

## 2016-09-11 NOTE — Telephone Encounter (Signed)
Return call to patient. Surgery options discussed. Patient anxious to proceed and selected date of 09-22-16. Surgery instruction sheet reviewed and printed copy mailed to patient. Questions answered. Will call PRN.  Requests note for work be mailed with surgery instruction sheet.  Routing to provider for final review. Patient agreeable to disposition. Will close encounter.

## 2016-09-11 NOTE — Progress Notes (Signed)
Note for surgery

## 2016-09-17 NOTE — Patient Instructions (Signed)
Your procedure is scheduled on:  Monday, Feb. 26, 2018  Enter through the [employer] [facility] at: 9:30 AM  Pick up the phone at the desk and dial [phone].  Call this number if you have problems the morning of surgery: [phone].  Remember: Do NOT eat food or drink after:  Midnight Sunday  Take these medicines the morning of surgery with a SIP OF WATER:  None  Stop ALL herbal medications at this time  Do NOT smoke the day of surgery.  Do NOT wear jewelry (body piercing), metal hair clips/bobby pins, make-up, or nail polish. Do NOT wear lotions, powders, or perfumes.  You may wear deodorant. Do NOT shave for 48 hours prior to surgery. Do NOT bring valuables to the hospital. Contacts, dentures, or bridgework may not be worn into surgery.  Have a responsible adult drive you home and stay with you for 24 hours after your procedure  Bring a copy of your healthcare power of attorney and living will documents.  **Effective Friday, Jan. 12, 2018, [facility] will implement no hospital visitations from children age 59 and younger due to a steady increase in flu activity in our community and hospitals. **

## 2016-09-18 ENCOUNTER — Encounter (HOSPITAL_COMMUNITY): Payer: Self-pay

## 2016-09-18 ENCOUNTER — Other Ambulatory Visit: Payer: Self-pay

## 2016-09-18 ENCOUNTER — Encounter (HOSPITAL_COMMUNITY)
Admission: RE | Admit: 2016-09-18 | Discharge: 2016-09-18 | Disposition: A | Discharge status: Home / Self Care | Payer: BLUE CROSS/BLUE SHIELD | Source: Ambulatory Visit | Attending: Obstetrics and Gynecology | Admitting: Obstetrics and Gynecology

## 2016-09-18 DIAGNOSIS — Z01810 Encounter for preprocedural cardiovascular examination: Secondary | ICD-10-CM | POA: Diagnosis present

## 2016-09-18 DIAGNOSIS — Z01812 Encounter for preprocedural laboratory examination: Secondary | ICD-10-CM | POA: Diagnosis present

## 2016-09-18 LAB — CBC
HCT: 38.6 % (ref 36.0–46.0)
Hemoglobin: 13.4 g/dL (ref 12.0–15.0)
MCH: 32.1 pg (ref 26.0–34.0)
MCHC: 34.7 g/dL (ref 30.0–36.0)
MCV: 92.6 fL (ref 78.0–100.0)
PLATELETS: 235 10*3/uL (ref 150–400)
RBC: 4.17 MIL/uL (ref 3.87–5.11)
RDW: 13.4 % (ref 11.5–15.5)
WBC: 5.7 10*3/uL (ref 4.0–10.5)

## 2016-09-19 ENCOUNTER — Telehealth: Payer: Self-pay | Admitting: *Deleted

## 2016-09-19 ENCOUNTER — Telehealth: Payer: Self-pay | Admitting: Obstetrics and Gynecology

## 2016-09-19 NOTE — Telephone Encounter (Signed)
Spoke with the patient, discussed the potential option to try an get the equipment for an office hysteroscopy. She declined, will proceed with hysteroscopy on Monday in the OR. The patient would like to have a hysterectomy. Understands the reasoning for the hysteroscopy, D&C to further evaluate the atypical glandular cells instead of just proceeding with hysterectomy.

## 2016-09-19 NOTE — Telephone Encounter (Signed)
Call to patient at approx [phone]. Returning call from earlier this am when patient spoke to business office rep regarding surgical account. Patient requested call back regarding upcoming surgery on 09-22-16.   Patient asked "why does patient not have say so" in surgery. She states she had surgery last year and wanted all of this to be taken care of. Now she is having to have separate procedures that "cost more than double."  Attempt to review clinical information with patient. She states she understands all of that.   States she does not have any issues with Dr Talbert Nan. "she is fabulous." States it is "my body, my uterus, my health and I wanted it out but it wasn't done that way."  Because it wasn't done, it will "cost me double" and she has $25,000 additional cost she should not have to pay.  Advised patient sympathetic to her situation where she is. Can relate to feeling bad about extra cost and feel bad about that. Explained hysterectomy wasn't indicated last year and that it is unfortunate that new issue came up.  Patient states she needed to vent and she wanted [place] to know that she was unhappy about having to proceed with additional procedure. Thayer Ohm, practice administrator answered patient's billing office question.  Phone update provided to Dr Talbert Nan.  Routing to provider for final review. Patient agreeable to disposition. Will close encounter.

## 2016-09-22 ENCOUNTER — Ambulatory Visit (HOSPITAL_COMMUNITY)
Admission: RE | Admit: 2016-09-22 | Discharge: 2016-09-22 | Disposition: A | Discharge status: Home / Self Care | Payer: BLUE CROSS/BLUE SHIELD | Source: Ambulatory Visit | Attending: Obstetrics and Gynecology | Admitting: Obstetrics and Gynecology

## 2016-09-22 ENCOUNTER — Ambulatory Visit (HOSPITAL_COMMUNITY): Payer: BLUE CROSS/BLUE SHIELD | Admitting: Anesthesiology

## 2016-09-22 ENCOUNTER — Encounter (HOSPITAL_COMMUNITY): Payer: Self-pay

## 2016-09-22 ENCOUNTER — Encounter (HOSPITAL_COMMUNITY)
Admission: RE | Disposition: A | Discharge status: Home / Self Care | Payer: Self-pay | Source: Ambulatory Visit | Attending: Obstetrics and Gynecology

## 2016-09-22 DIAGNOSIS — Z833 Family history of diabetes mellitus: Secondary | ICD-10-CM | POA: Insufficient documentation

## 2016-09-22 DIAGNOSIS — Z811 Family history of alcohol abuse and dependence: Secondary | ICD-10-CM | POA: Insufficient documentation

## 2016-09-22 DIAGNOSIS — Z79890 Hormone replacement therapy (postmenopausal): Secondary | ICD-10-CM | POA: Insufficient documentation

## 2016-09-22 DIAGNOSIS — Z85820 Personal history of malignant melanoma of skin: Secondary | ICD-10-CM | POA: Insufficient documentation

## 2016-09-22 DIAGNOSIS — M19041 Primary osteoarthritis, right hand: Secondary | ICD-10-CM | POA: Diagnosis not present

## 2016-09-22 DIAGNOSIS — M19042 Primary osteoarthritis, left hand: Secondary | ICD-10-CM | POA: Diagnosis not present

## 2016-09-22 DIAGNOSIS — F329 Major depressive disorder, single episode, unspecified: Secondary | ICD-10-CM | POA: Diagnosis not present

## 2016-09-22 DIAGNOSIS — Z79899 Other long term (current) drug therapy: Secondary | ICD-10-CM | POA: Diagnosis not present

## 2016-09-22 DIAGNOSIS — Z8041 Family history of malignant neoplasm of ovary: Secondary | ICD-10-CM | POA: Diagnosis not present

## 2016-09-22 DIAGNOSIS — Z8249 Family history of ischemic heart disease and other diseases of the circulatory system: Secondary | ICD-10-CM | POA: Diagnosis not present

## 2016-09-22 DIAGNOSIS — Z7982 Long term (current) use of aspirin: Secondary | ICD-10-CM | POA: Diagnosis not present

## 2016-09-22 DIAGNOSIS — N950 Postmenopausal bleeding: Secondary | ICD-10-CM | POA: Diagnosis not present

## 2016-09-22 DIAGNOSIS — Z9889 Other specified postprocedural states: Secondary | ICD-10-CM | POA: Insufficient documentation

## 2016-09-22 DIAGNOSIS — N63 Unspecified lump in unspecified breast: Secondary | ICD-10-CM | POA: Diagnosis not present

## 2016-09-22 DIAGNOSIS — Z90722 Acquired absence of ovaries, bilateral: Secondary | ICD-10-CM | POA: Insufficient documentation

## 2016-09-22 DIAGNOSIS — Z8352 Family history of ear disorders: Secondary | ICD-10-CM | POA: Diagnosis not present

## 2016-09-22 DIAGNOSIS — R87619 Unspecified abnormal cytological findings in specimens from cervix uteri: Secondary | ICD-10-CM | POA: Diagnosis not present

## 2016-09-22 HISTORY — PX: DILATATION & CURETTAGE/HYSTEROSCOPY WITH MYOSURE: SHX6511

## 2016-09-22 SURGERY — DILATATION & CURETTAGE/HYSTEROSCOPY WITH MYOSURE
Anesthesia: General | Site: Vagina

## 2016-09-22 MED ORDER — OXYCODONE HCL 5 MG PO TABS: 5.0000 mg | ORAL_TABLET | Freq: Once | ORAL | Status: DC | PRN

## 2016-09-22 MED ORDER — LIDOCAINE HCL (CARDIAC) 20 MG/ML IV SOLN
INTRAVENOUS | Status: AC
  Filled 2016-09-22: qty 5

## 2016-09-22 MED ORDER — SCOPOLAMINE 1 MG/3DAYS TD PT72
MEDICATED_PATCH | TRANSDERMAL | Status: AC
  Administered 2016-09-22: 1.5 mg via TRANSDERMAL
  Filled 2016-09-22: qty 1

## 2016-09-22 MED ORDER — LIDOCAINE HCL (CARDIAC) 20 MG/ML IV SOLN
INTRAVENOUS | Status: DC | PRN
  Administered 2016-09-22: 50 mg via INTRAVENOUS

## 2016-09-22 MED ORDER — ONDANSETRON HCL 4 MG/2ML IJ SOLN
INTRAMUSCULAR | Status: AC
  Filled 2016-09-22: qty 2

## 2016-09-22 MED ORDER — ONDANSETRON HCL 4 MG/2ML IJ SOLN
INTRAMUSCULAR | Status: DC | PRN
Start: 2016-09-22 — End: 2016-09-22
  Administered 2016-09-22: 4 mg via INTRAVENOUS

## 2016-09-22 MED ORDER — MIDAZOLAM HCL 2 MG/2ML IJ SOLN
INTRAMUSCULAR | Status: DC | PRN
  Administered 2016-09-22: 2 mg via INTRAVENOUS

## 2016-09-22 MED ORDER — MIDAZOLAM HCL 2 MG/2ML IJ SOLN
INTRAMUSCULAR | Status: AC
  Filled 2016-09-22: qty 2

## 2016-09-22 MED ORDER — FENTANYL CITRATE (PF) 100 MCG/2ML IJ SOLN
INTRAMUSCULAR | Status: DC | PRN
Start: 2016-09-22 — End: 2016-09-22
  Administered 2016-09-22: 100 ug via INTRAVENOUS

## 2016-09-22 MED ORDER — DEXAMETHASONE SODIUM PHOSPHATE 10 MG/ML IJ SOLN
INTRAMUSCULAR | Status: DC | PRN
  Administered 2016-09-22: 10 mg via INTRAVENOUS

## 2016-09-22 MED ORDER — SCOPOLAMINE 1 MG/3DAYS TD PT72
1.0000 | MEDICATED_PATCH | TRANSDERMAL | Status: DC
  Administered 2016-09-22: 1.5 mg via TRANSDERMAL

## 2016-09-22 MED ORDER — KETOROLAC TROMETHAMINE 30 MG/ML IJ SOLN
INTRAMUSCULAR | Status: AC
  Filled 2016-09-22: qty 1

## 2016-09-22 MED ORDER — OXYCODONE HCL 5 MG/5ML PO SOLN: 5.0000 mg | Freq: Once | ORAL | Status: DC | PRN

## 2016-09-22 MED ORDER — HYDROMORPHONE HCL 1 MG/ML IJ SOLN: 0.2500 mg | INTRAMUSCULAR | Status: DC | PRN

## 2016-09-22 MED ORDER — PROPOFOL 10 MG/ML IV BOLUS
INTRAVENOUS | Status: AC
  Filled 2016-09-22: qty 20

## 2016-09-22 MED ORDER — DEXAMETHASONE SODIUM PHOSPHATE 4 MG/ML IJ SOLN
INTRAMUSCULAR | Status: AC
  Filled 2016-09-22: qty 1

## 2016-09-22 MED ORDER — MEPERIDINE HCL 25 MG/ML IJ SOLN: 6.2500 mg | INTRAMUSCULAR | Status: DC | PRN

## 2016-09-22 MED ORDER — LACTATED RINGERS IV SOLN: INTRAVENOUS | Status: DC

## 2016-09-22 MED ORDER — LACTATED RINGERS IV SOLN
INTRAVENOUS | Status: DC
  Administered 2016-09-22: 10:00:00 via INTRAVENOUS

## 2016-09-22 MED ORDER — KETOROLAC TROMETHAMINE 30 MG/ML IJ SOLN
INTRAMUSCULAR | Status: DC | PRN
  Administered 2016-09-22: 30 mg via INTRAVENOUS

## 2016-09-22 MED ORDER — PROPOFOL 10 MG/ML IV BOLUS
INTRAVENOUS | Status: DC | PRN
  Administered 2016-09-22: 180 mg via INTRAVENOUS

## 2016-09-22 MED ORDER — FENTANYL CITRATE (PF) 100 MCG/2ML IJ SOLN
INTRAMUSCULAR | Status: AC
  Filled 2016-09-22: qty 2

## 2016-09-22 MED ORDER — PROMETHAZINE HCL 25 MG/ML IJ SOLN: 6.2500 mg | INTRAMUSCULAR | Status: DC | PRN

## 2016-09-22 SURGICAL SUPPLY — 19 items
CANISTER SUCT 3000ML (MISCELLANEOUS) ×2 IMPLANT
CATH ROBINSON RED A/P 16FR (CATHETERS) ×2 IMPLANT
CLOTH BEACON ORANGE TIMEOUT ST (SAFETY) ×2 IMPLANT
CONTAINER PREFILL 10% NBF 60ML (FORM) ×4 IMPLANT
DEVICE MYOSURE LITE (MISCELLANEOUS) IMPLANT
DEVICE MYOSURE REACH (MISCELLANEOUS) IMPLANT
FILTER ARTHROSCOPY CONVERTOR (FILTER) ×2 IMPLANT
GLOVE BIO SURGEON STRL SZ 6.5 (GLOVE) ×2 IMPLANT
GLOVE BIOGEL PI IND STRL 7.0 (GLOVE) ×2 IMPLANT
GLOVE BIOGEL PI INDICATOR 7.0 (GLOVE) ×2
GOWN STRL REUS W/TWL LRG LVL3 (GOWN DISPOSABLE) ×4 IMPLANT
PACK VAGINAL MINOR WOMEN LF (CUSTOM PROCEDURE TRAY) ×2 IMPLANT
PAD OB MATERNITY 4.3X12.25 (PERSONAL CARE ITEMS) ×2 IMPLANT
SEAL ROD LENS SCOPE MYOSURE (ABLATOR) ×2 IMPLANT
SYR 20CC LL (SYRINGE) IMPLANT
TOWEL OR 17X24 6PK STRL BLUE (TOWEL DISPOSABLE) ×4 IMPLANT
TUBING AQUILEX INFLOW (TUBING) ×2 IMPLANT
TUBING AQUILEX OUTFLOW (TUBING) ×2 IMPLANT
WATER STERILE IRR 1000ML POUR (IV SOLUTION) ×2 IMPLANT

## 2016-09-22 NOTE — Anesthesia Preprocedure Evaluation (Addendum)
Anesthesia Evaluation  Patient identified by MRN, date of birth, ID band Patient awake    Reviewed: Allergy & Precautions, NPO status , Patient's Chart, lab work & pertinent test results  History of Anesthesia Complications (+) PONV and history of anesthetic complications  Airway Mallampati: II  TM Distance: >3 FB Neck ROM: Full    Dental  (+) Teeth Intact, Dental Advisory Given   Pulmonary neg pulmonary ROS,    breath sounds clear to auscultation       Cardiovascular negative cardio ROS   Rhythm:Regular Rate:Normal     Neuro/Psych PSYCHIATRIC DISORDERS Depression negative neurological ROS     GI/Hepatic negative GI ROS, Neg liver ROS,   Endo/Other  negative endocrine ROS  Renal/GU negative Renal ROS  negative genitourinary   Musculoskeletal  (+) Arthritis ,   Abdominal   Peds negative pediatric ROS (+)  Hematology negative hematology ROS (+)   Anesthesia Other Findings   Reproductive/Obstetrics negative OB ROS                            Lab Results  Component Value Date   WBC 5.7 09/18/2016   HGB 13.4 09/18/2016   HCT 38.6 09/18/2016   MCV 92.6 09/18/2016   PLT 235 09/18/2016   Lab Results  Component Value Date   CREATININE 0.82 08/24/2015   BUN 17 08/24/2015   NA 137 08/24/2015   K 4.4 08/24/2015   CL 102 08/24/2015   CO2 26 08/24/2015   No results found for: INR, PROTIME  08/2016 EKG: normal sinus rhythm.  Anesthesia Physical Anesthesia Plan  ASA: II  Anesthesia Plan: General   Post-op Pain Management:    Induction: Intravenous  Airway Management Planned: LMA  Additional Equipment:   Intra-op Plan:   Post-operative Plan: Extubation in OR  Informed Consent: I have reviewed the patients History and Physical, chart, labs and discussed the procedure including the risks, benefits and alternatives for the proposed anesthesia with the patient or authorized  representative who has indicated his/her understanding and acceptance.   Dental advisory given  Plan Discussed with: CRNA  Anesthesia Plan Comments:         Anesthesia Quick Evaluation

## 2016-09-22 NOTE — Discharge Instructions (Signed)

## 2016-09-22 NOTE — H&P (Signed)
GYNECOLOGY  VISIT   HPI: 61 y.o.   Single  Caucasian  female   G1P0010 with Patient's last menstrual period was 07/28/2008.   here for colposcopy. Pap with atypical glandular cells, negative hpv on recent pap. The pap was done secondary to PMP bleeding (only noted on exam). Endometrial biopsy with scant inactive endometrium. On sonohysterogram on some views the lining appeared slightly thickened, on others it didn't (?artifact).  She underwent a laparoscopic BSO last year for a adnexal mass and family history of ovarian cancer. Benign pathology.    GYNECOLOGIC HISTORY: Patient's last menstrual period was 07/28/2008. Contraception:postmenopause Menopausal hormone therapy: none                 OB History    Gravida Para Term Preterm AB Living   1 0     1 0   SAB TAB Ectopic Multiple Live Births                         Patient Active Problem List   Diagnosis Date Noted  . Medication management 02/07/2013  . Visit for preventive health examination 11/18/2010  . Hx of melanoma of skin   . PERIMENOPAUSAL SYNDROME 12/21/2009  . HAND PAIN, BILATERAL 12/21/2009  . 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 INSOMNIA 10/13/2007  . BACK PAIN 04/26/2007        Past Medical History:  Diagnosis Date  . Amenorrhea   . Anemia    history of  . Arthritis    bilateral hands  . Breast nodule 09/2006   no mass  . Cancer ([place])    melanoma  . Depression   . Hx of melanoma of skin 2011   left foot   . Mood disorder ([place])   . PONV (postoperative nausea and vomiting)          Past Surgical History:  Procedure Laterality Date  . BUNIONECTOMY     right  . COLONOSCOPY    . LAPAROSCOPIC BILATERAL SALPINGO OOPHERECTOMY Bilateral 09/04/2015   Procedure: LAPAROSCOPIC BILATERAL SALPINGO OOPHORECTOMY with pelvic washings;  Surgeon: Salvadore Dom, MD;  Location: [facility];  Service: Gynecology;  Laterality: Bilateral;  . MELANOMA EXCISION  2012   left foot   . OVARIAN CYST  REMOVAL            Current Outpatient Prescriptions  Medication Sig Dispense Refill  . aspirin EC 81 MG tablet Take 81 mg by mouth daily.    . citalopram (CELEXA) 20 MG tablet TAKE ONE TABLET BY MOUTH ONE TIME DAILY 90 tablet 4  . estradiol (CLIMARA) 0.0375 mg/24hr patch Place 1 patch (0.0375 mg total) onto the skin once a week. 12 patch 4  . medroxyPROGESTERone (PROVERA) 2.5 MG tablet Take 1 tablet (2.5 mg total) by mouth daily. 90 tablet 4   No current facility-administered medications for this visit.      ALLERGIES: Bactrim [sulfamethoxazole-trimethoprim] and Sulfamethoxazole        Family History  Problem Relation Age of Onset  . Macular degeneration Mother   . Hypertension Mother   . Diabetes Father   . Heart attack Father     x 2  . Heart disease Father   . Alcohol abuse Sister   . Alcohol abuse Brother   . Deep vein thrombosis Brother     disable and immoble also nephew  . Ovarian cancer Sister   . Lung cancer    . Colon cancer  Social History        Social History  . Marital status: Single    Spouse name: N/A  . Number of children: N/A  . Years of education: N/A      Occupational History  . Not on file.        Social History Main Topics  . Smoking status: Never Smoker  . Smokeless tobacco: Never Used  . Alcohol use 1.8 oz/week    3 Glasses of wine per week  . Drug use: No  . Sexual activity: No       Other Topics Concern  . Not on file      Social History Narrative   Single   HH of 1   Pets 1 Cat   Neg ets firearms wears seat belts has smoke alarm.    G1    2 jobs       Had smoked a few time per year  And stopped totally for 3 years.    Sister d  ovarian cancer/ parents ailing declining they live in [place] she is to travel back and forth. Sr. alcoholic   Occasional social alcohol   Down to 40 hours              Review of Systems  Constitutional: Negative.   HENT:  Negative.   Eyes: Negative.   Respiratory: Negative.   Cardiovascular: Negative.   Gastrointestinal: Negative.   Genitourinary: Negative.   Musculoskeletal: Negative.   Skin: Negative.   Neurological: Negative.   Endo/Heme/Allergies: Negative.   Psychiatric/Behavioral: Negative.     PHYSICAL EXAMINATION:    BP 118/60 (BP Location: Right Arm, Patient Position: Sitting, Cuff Size: Normal)   Pulse 72   Resp 16   Wt 131 lb (59.4 kg)   LMP 07/28/2008   BMI 22.31 kg/m     General appearance: alert, cooperative and appears stated age  Pelvic: External genitalia:  no lesions              Urethra:  normal appearing urethra with no masses, tenderness or lesions              Bartholins and Skenes: normal                 Vagina: normal appearing vagina with normal color and discharge, no lesions              Cervix: no gross lesions  Colposcopy: unsatisfactory, no aceto-white changes. Negative lugols examination of the cervix and upper vagina. Stenotic cervix. A tenaculum was placed at 12 o'clock. The cervix was dilated with mini-dilators. An ECC was obtained. The tenaculum was removed.   Chaperone was present for exam.  ASSESSMENT Postmenopausal spotting Atypical glandular cells on pap (negative hpv) Inactive endometrium on endometrial biopsy  Possible asymmetric thickening on sonohysterogram    PLAN Colposcopy was unsatisfactory and negative ECC done Discussed the possible need for hysteroscopy, D&C with the patient Further plans after the biopsy return   An After Visit Summary was printed and given to the patient.   Addendum:  CV: RRR Lungs: CTAB  ECC returned as negative, inactive endometrial biopsy. Given atypical glandular cells on pap and possible thickening on sonohysterogram will proceed with hysteroscopy, D&C.  Risks reviewed.

## 2016-09-22 NOTE — Anesthesia Procedure Notes (Signed)
Procedure Name: LMA Insertion Date/Time: 09/22/2016 10:30 AM Performed by: Casimer Lanius A Pre-anesthesia Checklist: Patient identified, Patient being monitored, Emergency Drugs available, Timeout performed and Suction available Patient Re-evaluated:Patient Re-evaluated prior to inductionOxygen Delivery Method: Circle System Utilized Preoxygenation: Pre-oxygenation with 100% oxygen Intubation Type: Combination inhalational/ intravenous induction Ventilation: Mask ventilation without difficulty LMA: LMA inserted LMA Size: 4.0 Number of attempts: 1 Placement Confirmation: positive ETCO2 and breath sounds checked- equal and bilateral Dental Injury: Teeth and Oropharynx as per pre-operative assessment

## 2016-09-22 NOTE — Transfer of Care (Signed)
Immediate Anesthesia Transfer of Care Note  Patient: Kendra Hoffman  Procedure(s) Performed: Procedure(s) with comments: DILATATION & CURETTAGE/HYSTEROSCOPY WITH MYOSURE (possible Myosure) (N/A) - follow first case  Patient Location: PACU  Anesthesia Type:General  Level of Consciousness: sedated  Airway & Oxygen Therapy: Patient Spontanous Breathing and Patient connected to nasal cannula oxygen  Post-op Assessment: Report given to RN  Post vital signs: Reviewed and stable  Last Vitals:  Vitals:   09/22/16 0930  BP: 117/72  Pulse: 78  Resp: 18  Temp: 37 C    Last Pain:  Vitals:   09/22/16 0930  TempSrc: Oral         Complications: No apparent anesthesia complications

## 2016-09-22 NOTE — Op Note (Signed)
Preoperative Diagnosis: postmenopausal bleeding, atypical glandular cells on pap smear  Postoperative Diagnosis: Same  Procedure: Hysteroscopy, dilation and curettage  Surgeon: Dr Sumner Boast  Assistants: None  Anesthesia: General via LMA  EBL: 5 cc  Fluids: 600 cc LR  Fluid deficit: 140 cc  Urine output: not recorded  Indications for surgery: The patient is a 61 yo female, who presented with postmenopausal bleeding (noted on exam). Work up included an inactive endometrial biopsy, sonohysterogram with some views showing slight thickening of the endometrium, unsatisfactory colposcopy with negative endocervical curettage The risks of the surgery were reviewed with the patient and the consent form was signed prior to her surgery.  Findings: EUA: small anteverted uterus, no adnexal masses. Hysteroscopy: thin endometrium, normal tubal ostia bilaterally, normal endocervical canal.  Specimens: Endocervical curettage, endometrial curettage.    Procedure: The patient was taken to the operating room with an IV in place. She was placed in the dorsal lithotomy position and anesthesia was administered. She was prepped and draped in the usual sterile fashion for a vaginal procedure. She voided on the way to the OR. A weighted speculum was placed in the vagina and a single tooth tenaculum was placed on the anterior lip of the cervix. An endocervical curettage was performed. The cervix was dilated to a #7 hagar dilator. The uterus was sounded to 7 cm. The myosure hysteroscope was inserted into the uterine cavity. With continuous infusion of normal saline, the uterine cavity was visualized with the above findings. The myosure was then removed. The cavity was then curetted with the small sharp curette. The cavity had the characteristically gritty texture at the end of the procedure. The curette and the single tooth tenaculum were removed. The speculum was removed. The patients perineum was cleansed of  betadine and she was taken out of the dorsal lithotomy position.  Upon awakening the LMA was removed and the patient was transferred to the recovery room in stable and awake condition.  The sponge and instrument count were correct. There were no complications.

## 2016-09-22 NOTE — Anesthesia Postprocedure Evaluation (Addendum)
Anesthesia Post Note  Patient: [place]  Procedure(s) Performed: Procedure(s) (LRB): DILATATION & CURETTAGE/HYSTEROSCOPY WITH MYOSURE (possible Myosure) (N/A)  Patient location during evaluation: PACU Anesthesia Type: General Level of consciousness: awake and alert Pain management: pain level controlled Vital Signs Assessment: post-procedure vital signs reviewed and stable Respiratory status: spontaneous breathing, nonlabored ventilation, respiratory function stable and patient connected to nasal cannula oxygen Cardiovascular status: blood pressure returned to baseline and stable Postop Assessment: no signs of nausea or vomiting Anesthetic complications: no        Last Vitals:  Vitals:   09/22/16 1200 09/22/16 1235  BP: 135/81 137/79  Pulse: (!) 56 (!) 59  Resp: 15 18  Temp:      Last Pain:  Vitals:   09/22/16 1148  TempSrc: Oral   Pain Goal:                 Effie Berkshire

## 2016-09-24 ENCOUNTER — Encounter (HOSPITAL_COMMUNITY): Payer: Self-pay | Admitting: Obstetrics and Gynecology

## 2016-10-04 ENCOUNTER — Other Ambulatory Visit: Payer: Self-pay | Admitting: Obstetrics and Gynecology

## 2016-10-06 ENCOUNTER — Telehealth: Payer: Self-pay | Admitting: Obstetrics and Gynecology

## 2016-10-06 ENCOUNTER — Ambulatory Visit: Payer: BLUE CROSS/BLUE SHIELD | Admitting: Obstetrics and Gynecology

## 2016-10-06 NOTE — Telephone Encounter (Signed)
Medication refill request: citalopram, provera Last AEX:  08/04/16 JJ Next AEX: none Last MMG (if hormonal medication request): 05/12/16 [id number]:neg  Refill authorized: both 08/04/16 #90/4R to [facility] [place]

## 2016-10-06 NOTE — Telephone Encounter (Signed)
Called patient and left a message to call back to reschedule her two week post op that was cancelled for today due to inclement weather.

## 2016-10-07 NOTE — Telephone Encounter (Signed)
I called the patient back and rescheduled her to 10/16/16 with Dr. Talbert Nan. She said she is not available to come in for the rest of this week or early next week.  Patient states she is "doing fine" and has "nothing to report at this time."  Routing to Dr. Talbert Nan for [place] only.

## 2016-10-16 ENCOUNTER — Encounter: Payer: Self-pay | Admitting: Obstetrics and Gynecology

## 2016-10-16 ENCOUNTER — Ambulatory Visit (INDEPENDENT_AMBULATORY_CARE_PROVIDER_SITE_OTHER): Payer: BLUE CROSS/BLUE SHIELD | Admitting: Obstetrics and Gynecology

## 2016-10-16 VITALS — BP 110/66 | HR 80 | Resp 16 | Ht 64.25 in | Wt 131.0 lb

## 2016-10-16 DIAGNOSIS — Z9889 Other specified postprocedural states: Secondary | ICD-10-CM

## 2016-10-16 DIAGNOSIS — N950 Postmenopausal bleeding: Secondary | ICD-10-CM

## 2016-10-16 NOTE — Progress Notes (Signed)
GYNECOLOGY  VISIT   HPI: 61 y.o.   Single  Caucasian  female   G1P0010 with Patient's last menstrual period was 07/28/2008.   here for post op from 09/22/16   DILATATION & CURETTAGE/HYSTEROSCOPY WITH MYOSURE (possible Myosure). The patient had the above procedure for postmenopausal bleeding (seen x 1) and atypical glandular cells on pap. Prior to the surgery she had an inactive endometrial biopsy, an unsatisfactory colposcopy and a negative ECC. Sonohysterogram with possible endometrial thickening on some views. Hysteroscopy was negative, ECC and EMC were all benign.   GYNECOLOGIC HISTORY: Patient's last menstrual period was 07/28/2008. Contraception: post menopausal  Menopausal hormone therapy: climara, provera        OB History    Gravida Para Term Preterm AB Living   1 0     1 0   SAB TAB Ectopic Multiple Live Births                     Patient Active Problem List   Diagnosis Date Noted  . Medication management 02/07/2013  . Visit for preventive health examination 11/18/2010  . Hx of melanoma of skin   . PERIMENOPAUSAL SYNDROME 12/21/2009  . HAND PAIN, BILATERAL 12/21/2009  . 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 INSOMNIA 10/13/2007  . BACK PAIN 04/26/2007    Past Medical History:  Diagnosis Date  . Amenorrhea   . Anemia    history of at age 62  . Arthritis    bilateral hands  . Breast nodule 09/2006   no mass, patient denies being told about this issue 09/18/2016  . Cancer ([place])    melanoma  . Depression   . Hx of melanoma of skin 2011   left foot   . Mood disorder ([place])   . PONV (postoperative nausea and vomiting)     Past Surgical History:  Procedure Laterality Date  . BUNIONECTOMY     right  . COLONOSCOPY    . DILATATION & CURETTAGE/HYSTEROSCOPY WITH MYOSURE N/A 09/22/2016   Procedure: DILATATION & CURETTAGE/HYSTEROSCOPY WITH MYOSURE (possible Myosure);  Surgeon: Salvadore Dom, MD;  Location: [facility];  Service: Gynecology;  Laterality: N/A;  follow first case  .  LAPAROSCOPIC BILATERAL SALPINGO OOPHERECTOMY Bilateral 09/04/2015   Procedure: LAPAROSCOPIC BILATERAL SALPINGO OOPHORECTOMY with pelvic washings;  Surgeon: Salvadore Dom, MD;  Location: [facility];  Service: Gynecology;  Laterality: Bilateral;  . MELANOMA EXCISION  2012   left foot   . OVARIAN CYST REMOVAL      Current Outpatient Prescriptions  Medication Sig Dispense Refill  . aspirin EC 81 MG tablet Take 81 mg by mouth every evening.     . citalopram (CELEXA) 20 MG tablet TAKE ONE TABLET BY MOUTH ONE TIME DAILY (Patient taking differently: Take 20 mg by mouth every evening. ) 90 tablet 4  . DiphenhydrAMINE HCl 50 MG/30ML LIQD Take 50 mg by mouth at bedtime as needed (sleep).    Marland Kitchen estradiol (CLIMARA) 0.0375 mg/24hr patch Place 1 patch (0.0375 mg total) onto the skin once a week. (Patient taking differently: Place 0.0375 mg onto the skin once a week. Sundays) 12 patch 4  . medroxyPROGESTERone (PROVERA) 2.5 MG tablet Take 1 tablet (2.5 mg total) by mouth daily. (Patient taking differently: Take 2.5 mg by mouth every evening. ) 90 tablet 4   No current facility-administered medications for this visit.      ALLERGIES: Bactrim [sulfamethoxazole-trimethoprim] and Sulfamethoxazole  Family History  Problem Relation Age of Onset  . Macular degeneration Mother   .  Hypertension Mother   . Diabetes Father   . Heart attack Father     x 2  . Heart disease Father   . Alcohol abuse Sister   . Alcohol abuse Brother   . Deep vein thrombosis Brother     disable and immoble also nephew  . Ovarian cancer Sister   . Lung cancer    . Colon cancer      Social History   Social History  . Marital status: Single    Spouse name: N/A  . Number of children: N/A  . Years of education: N/A   Occupational History  . Not on file.   Social History Main Topics  . Smoking status: Never Smoker  . Smokeless tobacco: Never Used  . Alcohol use 1.8 oz/week    3 Glasses of wine per week  . Drug use: No   . Sexual activity: No   Other Topics Concern  . Not on file   Social History Narrative   Single   HH of 1   Pets 1 Cat   Neg ets firearms wears seat belts has smoke alarm.    G1    2 jobs       Had smoked a few time per year  And stopped totally for 3 years.    Sister d  ovarian cancer/ parents ailing declining they live in [place] she is to travel back and forth. Sr. alcoholic   Occasional social alcohol   Down to 40 hours              Review of Systems  Constitutional: Negative.   HENT: Negative.   Eyes: Negative.   Respiratory: Negative.   Cardiovascular: Negative.   Gastrointestinal: Negative.   Genitourinary: Negative.   Musculoskeletal: Negative.   Skin: Negative.   Neurological: Negative.   Endo/Heme/Allergies: Negative.   Psychiatric/Behavioral: Negative.     PHYSICAL EXAMINATION:    BP 110/66 (BP Location: Right Arm, Patient Position: Sitting, Cuff Size: Normal)   Pulse 80   Resp 16   Ht 5' 4.25" (1.632 m)   Wt 131 lb (59.4 kg)   LMP 07/28/2008   BMI 22.31 kg/m     General appearance: alert, cooperative and appears stated age Abdomen: soft, non-tender; non distended; no masses,  no organomegaly  ASSESSMENT H/O Postmenopausal bleeding and atypical glandular cells on pap. Negative w/u    PLAN Pictures from surgery reviewed with the patient Call with any concerns F/U in 1/19 for an annual exam   An After Visit Summary was printed and given to the patient.

## 2016-12-26 NOTE — Addendum Note (Signed)
Addendum  created 12/26/16 1038 by Effie Berkshire, MD   Sign clinical note

## 2017-06-02 ENCOUNTER — Encounter: Payer: Self-pay | Admitting: Obstetrics and Gynecology

## 2017-07-01 ENCOUNTER — Other Ambulatory Visit: Payer: Self-pay | Admitting: *Deleted

## 2017-07-01 MED ORDER — MEDROXYPROGESTERONE ACETATE 2.5 MG PO TABS: 2.5000 mg | ORAL_TABLET | Freq: Every day | ORAL | 0 refills | Status: DC

## 2017-07-01 MED ORDER — CITALOPRAM HYDROBROMIDE 20 MG PO TABS: ORAL_TABLET | ORAL | 0 refills | Status: DC

## 2017-07-01 NOTE — Telephone Encounter (Signed)
Medication refill request: celexa and provera  Last AEX:  08/04/16 JJ Next AEX: 08/06/17 JJ Last MMG (if hormonal medication request): 05/19/17 BIRADS1:neg  Refill authorized: both 08/04/16 #90/4R. Today #90/0R?

## 2017-07-22 ENCOUNTER — Other Ambulatory Visit: Payer: Self-pay

## 2017-07-22 MED ORDER — ESTRADIOL 0.0375 MG/24HR TD PTWK: 0.0375 mg | MEDICATED_PATCH | TRANSDERMAL | 0 refills | Status: DC

## 2017-07-22 NOTE — Telephone Encounter (Signed)
Spoke with patient. Per patient, has enough Provera to last until AEX on 08/06/17.

## 2017-07-22 NOTE — Telephone Encounter (Signed)
Medication refill request: Estradiol patch Last AEX:  08/04/16 JJ Next AEX: 08/06/17  Last MMG (if hormonal medication request): 05/19/17 BIRADS1:neg  Refill authorized: 08/04/16 #12 w/4 refills; today please advise

## 2017-07-22 NOTE — Telephone Encounter (Signed)
I've sent in the estrogen patch, please confirm she has enough provera.

## 2017-08-06 ENCOUNTER — Ambulatory Visit: Payer: BLUE CROSS/BLUE SHIELD | Admitting: Obstetrics and Gynecology

## 2017-08-06 ENCOUNTER — Other Ambulatory Visit: Payer: Self-pay

## 2017-08-06 ENCOUNTER — Encounter: Payer: Self-pay | Admitting: Obstetrics and Gynecology

## 2017-08-06 ENCOUNTER — Other Ambulatory Visit (HOSPITAL_COMMUNITY)
Admission: RE | Admit: 2017-08-06 | Discharge: 2017-08-06 | Disposition: A | Discharge status: Home / Self Care | Payer: BLUE CROSS/BLUE SHIELD | Source: Ambulatory Visit | Attending: Obstetrics and Gynecology | Admitting: Obstetrics and Gynecology

## 2017-08-06 VITALS — BP 110/72 | HR 70 | Resp 16 | Ht 64.0 in | Wt 133.0 lb

## 2017-08-06 DIAGNOSIS — Z833 Family history of diabetes mellitus: Secondary | ICD-10-CM

## 2017-08-06 DIAGNOSIS — Z0000 Encounter for general adult medical examination without abnormal findings: Secondary | ICD-10-CM | POA: Diagnosis not present

## 2017-08-06 DIAGNOSIS — Z124 Encounter for screening for malignant neoplasm of cervix: Secondary | ICD-10-CM | POA: Insufficient documentation

## 2017-08-06 DIAGNOSIS — T17308A Unspecified foreign body in larynx causing other injury, initial encounter: Secondary | ICD-10-CM | POA: Diagnosis not present

## 2017-08-06 DIAGNOSIS — Z01419 Encounter for gynecological examination (general) (routine) without abnormal findings: Secondary | ICD-10-CM

## 2017-08-06 MED ORDER — ESTRADIOL 0.0375 MG/24HR TD PTWK: 0.0375 mg | MEDICATED_PATCH | TRANSDERMAL | 3 refills | Status: DC

## 2017-08-06 MED ORDER — MEDROXYPROGESTERONE ACETATE 2.5 MG PO TABS: 2.5000 mg | ORAL_TABLET | Freq: Every day | ORAL | 3 refills | Status: DC

## 2017-08-06 MED ORDER — CITALOPRAM HYDROBROMIDE 20 MG PO TABS: ORAL_TABLET | ORAL | 3 refills | Status: DC

## 2017-08-06 NOTE — Patient Instructions (Signed)

## 2017-08-06 NOTE — Progress Notes (Signed)
62 y.o. G1P0010 SingleCaucasianF here for annual exam.  She is on HRT, h/o BSO, benign pathology.  She is taking of her ill parents in [place], there every other week. Not working now.  Not dating, no interest. No vaginal bleeding. No bowel or bladder c/o other than some slight increase frequency of urination. She can void every 1-4 hours. Might leak a small amount with sneezing and a full bladder. Nocturia x 1. She drinks caffeine, 3 10-12 oz cups of coffee.  She has been having issues with choking on liquids, feels like her throat constricts. This has been going on for a few years.     Patient's last menstrual period was 07/28/2008.          Sexually active: No.  The current method of family planning is post menopausal status.    Exercising: No.   Smoker:  no  Health Maintenance: Pap:  08-19-16 atypical grandular cells NEG HR HPV 07-23-15 WNL  History of abnormal Pap:  Yes h/o cryosurgery in 20s/ colposcopy 09-04-16   MMG:  05-19-17 WNL  Colonoscopy:  01-19-12 WNL  BMD:   Age 89- normal per patient  TDaP:  11-18-10 Gardasil: N/A   reports that  has never smoked. she has never used smokeless tobacco. She reports that she drinks about 3.0 oz of alcohol per week. She reports that she does not use drugs. Scientist, physiological, retired for now, no children.   Past Medical History:  Diagnosis Date  . Amenorrhea   . Anemia    history of at age 70  . Arthritis    bilateral hands  . Breast nodule 09/2006   no mass, patient denies being told about this issue 09/18/2016  . Cancer ([place])    melanoma  . Depression   . Hx of melanoma of skin 2011   left foot   . Mood disorder ([place])   . PONV (postoperative nausea and vomiting)     Past Surgical History:  Procedure Laterality Date  . BUNIONECTOMY     right  . COLONOSCOPY    . DILATATION & CURETTAGE/HYSTEROSCOPY WITH MYOSURE N/A 09/22/2016   Procedure: DILATATION & CURETTAGE/HYSTEROSCOPY WITH MYOSURE (possible Myosure);  Surgeon: Salvadore Dom, MD;  Location: [facility];  Service: Gynecology;  Laterality: N/A;  follow first case  . LAPAROSCOPIC BILATERAL SALPINGO OOPHERECTOMY Bilateral 09/04/2015   Procedure: LAPAROSCOPIC BILATERAL SALPINGO OOPHORECTOMY with pelvic washings;  Surgeon: Salvadore Dom, MD;  Location: [facility];  Service: Gynecology;  Laterality: Bilateral;  . MELANOMA EXCISION  2012   left foot   . OVARIAN CYST REMOVAL      Current Outpatient Medications  Medication Sig Dispense Refill  . aspirin EC 81 MG tablet Take 81 mg by mouth every evening.     . citalopram (CELEXA) 20 MG tablet TAKE ONE TABLET BY MOUTH ONE TIME DAILY 90 tablet 0  . DiphenhydrAMINE HCl 50 MG/30ML LIQD Take 50 mg by mouth at bedtime as needed (sleep).    Marland Kitchen estradiol (CLIMARA) 0.0375 mg/24hr patch Place 1 patch (0.0375 mg total) onto the skin once a week. 12 patch 0  . medroxyPROGESTERone (PROVERA) 2.5 MG tablet Take 1 tablet (2.5 mg total) by mouth daily. 90 tablet 0   No current facility-administered medications for this visit.     Family History  Problem Relation Age of Onset  . Macular degeneration Mother   . Hypertension Mother   . Diabetes Father   . Heart attack Father  x 2  . Heart disease Father   . Alcohol abuse Sister   . Alcohol abuse Brother   . Deep vein thrombosis Brother        disable and immoble also nephew  . Ovarian cancer Sister   . Lung cancer Unknown   . Colon cancer Unknown     Review of Systems  Constitutional: Negative.   HENT: Negative.        Sinusitis  Eyes: Negative.   Respiratory: Negative.   Cardiovascular: Negative.   Gastrointestinal: Negative.   Endocrine: Negative.   Genitourinary: Positive for frequency.       Loss of sexual interest  Musculoskeletal: Positive for arthralgias.  Skin: Negative.   Allergic/Immunologic: Negative.   Neurological: Negative.   Psychiatric/Behavioral: Negative.     Exam:   BP 110/72 (BP Location: Right Arm, Patient Position: Sitting, Cuff  Size: Normal)   Pulse 70   Resp 16   Ht 5\' 4"  (1.626 m)   Wt 133 lb (60.3 kg)   LMP 07/28/2008   BMI 22.83 kg/m   Weight change: @WEIGHTCHANGE @ Height:   Height: 5\' 4"  (162.6 cm)  Ht Readings from Last 3 Encounters:  08/06/17 5\' 4"  (1.626 m)  10/16/16 5' 4.25" (1.632 m)  09/18/16 5\' 4"  (1.626 m)    General appearance: alert, cooperative and appears stated age Head: Normocephalic, without obvious abnormality, atraumatic Neck: no adenopathy, supple, symmetrical, trachea midline and thyroid normal to inspection and palpation Lungs: clear to auscultation bilaterally Cardiovascular: regular rate and rhythm Breasts: normal appearance, no masses or tenderness Abdomen: soft, non-tender; non distended,  no masses,  no organomegaly Extremities: extremities normal, atraumatic, no cyanosis or edema Skin: Skin color, texture, turgor normal. No rashes or lesions Lymph nodes: Cervical, supraclavicular, and axillary nodes normal. No abnormal inguinal nodes palpated Neurologic: Grossly normal   Pelvic: External genitalia:  no lesions              Urethra:  normal appearing urethra with no masses, tenderness or lesions              Bartholins and Skenes: normal                 Vagina: normal appearing vagina with normal color and discharge, no lesions              Cervix: no lesions               Bimanual Exam:  Uterus:  normal size, contour, position, consistency, mobility, non-tender              Adnexa: no mass, fullness, tenderness               Rectovaginal: Confirms               Anus:  normal sphincter tone, no lesions  Chaperone was present for exam.  A:  Well Woman with normal exam  Choking on liquids, feels throat is constricting  HRT, wants to continue, aware of risks  P:   Pap with hpv  Discussed breast self exam  Discussed calcium and vit D intake  Screening labs  Mammogram, DEXA, and colonoscopy UTD  Referral to ENT

## 2017-08-07 LAB — COMPREHENSIVE METABOLIC PANEL
ALK PHOS: 69 IU/L (ref 39–117)
ALT: 8 IU/L (ref 0–32)
AST: 19 IU/L (ref 0–40)
Albumin/Globulin Ratio: 2 (ref 1.2–2.2)
Albumin: 4.7 g/dL (ref 3.6–4.8)
BUN/Creatinine Ratio: 18 (ref 12–28)
BUN: 14 mg/dL (ref 8–27)
Bilirubin Total: 0.3 mg/dL (ref 0.0–1.2)
CO2: 22 mmol/L (ref 20–29)
CREATININE: 0.79 mg/dL (ref 0.57–1.00)
Calcium: 9.4 mg/dL (ref 8.7–10.3)
Chloride: 105 mmol/L (ref 96–106)
GFR calc Af Amer: 93 mL/min/{1.73_m2} (ref >59)
GFR calc non Af Amer: 81 mL/min/{1.73_m2} (ref >59)
GLUCOSE: 89 mg/dL (ref 65–99)
Globulin, Total: 2.3 g/dL (ref 1.5–4.5)
Potassium: 4.4 mmol/L (ref 3.5–5.2)
Sodium: 141 mmol/L (ref 134–144)
Total Protein: 7 g/dL (ref 6.0–8.5)

## 2017-08-07 LAB — CBC
Hematocrit: 42.4 % (ref 34.0–46.6)
Hemoglobin: 14 g/dL (ref 11.1–15.9)
MCH: 31.4 pg (ref 26.6–33.0)
MCHC: 33 g/dL (ref 31.5–35.7)
MCV: 95 fL (ref 79–97)
PLATELETS: 245 10*3/uL (ref 150–379)
RBC: 4.46 x10E6/uL (ref 3.77–5.28)
RDW: 13.3 % (ref 12.3–15.4)
WBC: 6 10*3/uL (ref 3.4–10.8)

## 2017-08-07 LAB — LIPID PANEL
Chol/HDL Ratio: 3.3 ratio (ref 0.0–4.4)
Cholesterol, Total: 208 mg/dL — ABNORMAL HIGH (ref 100–199)
HDL: 63 mg/dL (ref >39)
LDL Calculated: 129 mg/dL — ABNORMAL HIGH (ref 0–99)
Triglycerides: 81 mg/dL (ref 0–149)
VLDL Cholesterol Cal: 16 mg/dL (ref 5–40)

## 2017-08-07 LAB — HEMOGLOBIN A1C
ESTIMATED AVERAGE GLUCOSE: 111 mg/dL
HEMOGLOBIN A1C: 5.5 % (ref 4.8–5.6)

## 2017-08-10 ENCOUNTER — Telehealth: Payer: Self-pay | Admitting: *Deleted

## 2017-08-10 ENCOUNTER — Telehealth: Payer: Self-pay | Admitting: Obstetrics and Gynecology

## 2017-08-10 LAB — CYTOLOGY - PAP
Diagnosis: NEGATIVE
HPV (WINDOPATH): NOT DETECTED

## 2017-08-10 NOTE — Telephone Encounter (Signed)
Spoke with patient and gave results and recommendations. Patient voiced understanding -eh

## 2017-08-10 NOTE — Telephone Encounter (Signed)
Patient returned call. Provided patient with the referral appointment information, as listed in previous message. Patient understood, but states she may have a conflict with appointment date. Patient provided Dr Deeann Saint office phone number [phone] to call and re-schedule if needed. Call transferred to Starlyn Skeans, to provide lab results   Routing to Dr Talbert Nan for final review

## 2017-08-10 NOTE — Telephone Encounter (Signed)
Left message to call regarding lab results -eh 

## 2017-08-10 NOTE — Telephone Encounter (Signed)
-----   Message from Salvadore Dom, MD sent at 08/08/2017 12:24 PM EST ----- Please inform the patient that her total cholesterol and LDL were mildly elevated, the rest of her lipid panel and rest of her blood work was normal. Her pap is pending. She should eat a healthy diet, exercise regularly and check a fasting lipid panel next year.

## 2017-08-10 NOTE — Telephone Encounter (Signed)
Call placed to patient to convey appointment information for referral to Dr Kasandra Knudsen Raynelle Bring. Left voicemail message requesting a return call to provide information for appointment.  Appointment Scheduled with Dr Kasandra Knudsen Raynelle Bring on 08/25/17 at 1:50PM Address: [address]                 [place], [place] [address] Phone: [phone]

## 2017-08-25 ENCOUNTER — Other Ambulatory Visit (INDEPENDENT_AMBULATORY_CARE_PROVIDER_SITE_OTHER): Payer: Self-pay | Admitting: Otolaryngology

## 2017-08-25 DIAGNOSIS — R1312 Dysphagia, oropharyngeal phase: Secondary | ICD-10-CM

## 2017-09-14 ENCOUNTER — Ambulatory Visit (HOSPITAL_COMMUNITY)
Admission: RE | Admit: 2017-09-14 | Discharge: 2017-09-14 | Disposition: A | Discharge status: Home / Self Care | Payer: BLUE CROSS/BLUE SHIELD | Source: Ambulatory Visit | Attending: Otolaryngology | Admitting: Otolaryngology

## 2017-09-14 DIAGNOSIS — K449 Diaphragmatic hernia without obstruction or gangrene: Secondary | ICD-10-CM | POA: Insufficient documentation

## 2017-09-14 DIAGNOSIS — R1312 Dysphagia, oropharyngeal phase: Secondary | ICD-10-CM | POA: Diagnosis not present

## 2017-09-14 DIAGNOSIS — R1310 Dysphagia, unspecified: Secondary | ICD-10-CM | POA: Diagnosis present

## 2017-12-23 ENCOUNTER — Ambulatory Visit: Payer: BLUE CROSS/BLUE SHIELD | Admitting: Internal Medicine

## 2018-07-09 ENCOUNTER — Telehealth: Payer: Self-pay | Admitting: Obstetrics and Gynecology

## 2018-07-09 NOTE — Telephone Encounter (Signed)
Patient called and left a message at lunch. I returned her call. She said due to a change in insurance, she will have to switch to a new GYN provider that is part of the [facility]. She said she wanted Dr. Talbert Nan to know that she is not happy about switching because she really liked Dr. Talbert Nan. FYI only. AEX cancelled for January 2020.

## 2018-08-25 ENCOUNTER — Ambulatory Visit: Payer: BLUE CROSS/BLUE SHIELD | Admitting: Obstetrics and Gynecology

## 2018-09-15 ENCOUNTER — Telehealth: Payer: Self-pay | Admitting: Obstetrics and Gynecology

## 2018-09-15 MED ORDER — MEDROXYPROGESTERONE ACETATE 2.5 MG PO TABS: 2.5000 mg | ORAL_TABLET | Freq: Every day | ORAL | 0 refills | Status: DC

## 2018-09-15 MED ORDER — ESTRADIOL 0.0375 MG/24HR TD PTWK: 0.0375 mg | MEDICATED_PATCH | TRANSDERMAL | 0 refills | Status: DC

## 2018-09-15 MED ORDER — CITALOPRAM HYDROBROMIDE 20 MG PO TABS: ORAL_TABLET | ORAL | 0 refills | Status: DC

## 2018-09-15 NOTE — Telephone Encounter (Signed)
Please let her know that I've sent in a 90 day supply of her medications. Let her know that I wish her the best and I'm so she has to go elsewhere!

## 2018-09-15 NOTE — Telephone Encounter (Signed)
Patient had to change provider because of insurance and has questions about getting her refills.

## 2018-09-15 NOTE — Telephone Encounter (Signed)
Patient has to change  GYN due to insurance coverage.  She is very sad about this. Has appointment with new gyn [facility]) but appointment is not until May.  Asks if Dr. Talbert Nan can fill rx for Celexa, Climara and Provera until she is able to establish care.  [facility] [address].

## 2018-09-16 NOTE — Telephone Encounter (Signed)
Pt notified and appreciates the help from Dr. Talbert Nan.  Encounter closed.

## 2018-09-21 ENCOUNTER — Other Ambulatory Visit: Payer: Self-pay | Admitting: Obstetrics and Gynecology

## 2018-09-30 ENCOUNTER — Telehealth: Payer: Self-pay

## 2018-09-30 NOTE — Telephone Encounter (Signed)
Please send the patient a letter that we understand that she had to change practices, but we wanted to remind her that she is due for a pap with her new provider. Offer to send records wherever she needs them to go.

## 2018-09-30 NOTE — Telephone Encounter (Signed)
Patient has switched to another GYN due to insurance coverage. She is however, in 08 recall with our office. Please advise on recall.

## 2018-10-21 NOTE — Telephone Encounter (Signed)
Letter written and to Dr.Jertson for review. 

## 2018-12-06 DIAGNOSIS — F338 Other recurrent depressive disorders: Secondary | ICD-10-CM | POA: Insufficient documentation

## 2019-01-10 IMAGING — RF DG ESOPHAGUS
9 series · 24 of 24 positions shown · non-contrast
Comparison: The pharyngeal phase of swallowing appears normal site
from a mildly prominent cricopharyngeus shown on image [DATE].

CLINICAL DATA: Choking on liquids with silent reflux.

EXAM:
ESOPHOGRAM / BARIUM SWALLOW / BARIUM TABLET STUDY
TECHNIQUE: Combined double contrast and single contrast examination performed
using effervescent crystals, thick barium liquid, and thin barium
liquid. The patient was observed with fluoroscopy swallowing a 13 mm
barium sulphate tablet.
FLUOROSCOPY TIME:  Fluoroscopy Time:  2 minutes 0 seconds
Radiation Exposure Index (if provided by the fluoroscopic device):
11.1 mGy
Number of Acquired Spot Images: 0

[Series 1: cp_standard · 0.34mm/px · 3 of 44 frames shown (1 of 9)]
[frame 7/44]
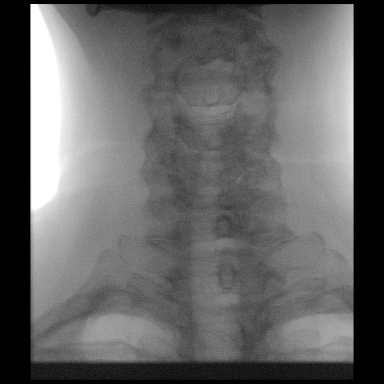
[frame 23/44]
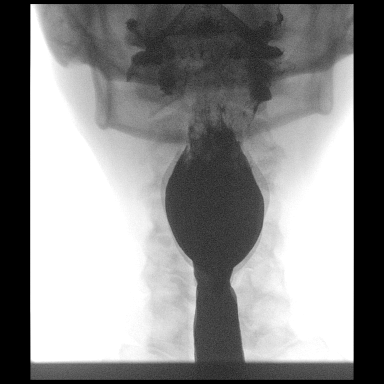
[frame 38/44]
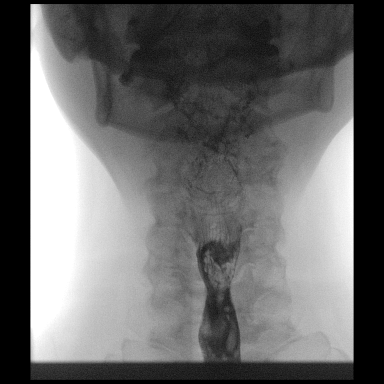

[Series 2: cp_standard · 0.34mm/px · 2 of 33 frames shown (2 of 9)]
[frame 17/33]
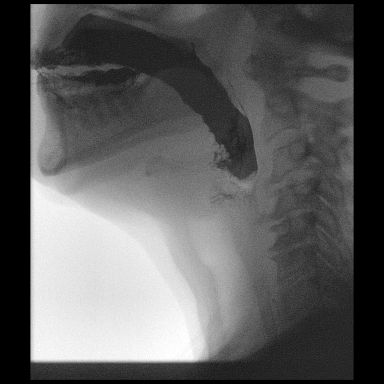
[frame 24/33]
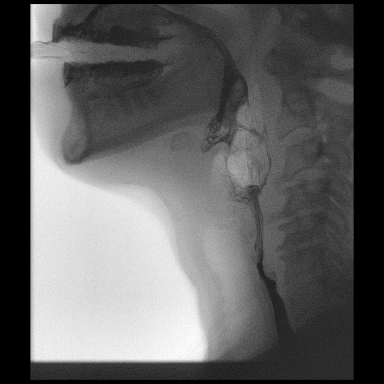

[Series 3: cp_standard · 0.34mm/px · 3 of 63 frames shown (3 of 9)]
[frame 3/63]
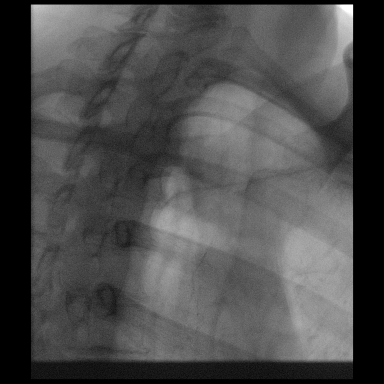
[frame 10/63]
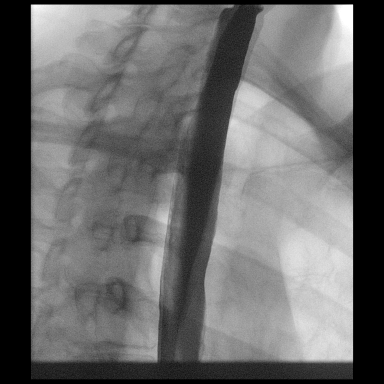
[frame 54/63]
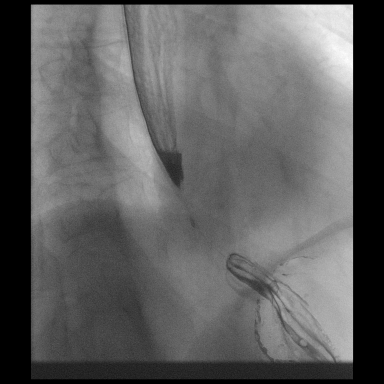

[Series 4: cp_standard · 0.34mm/px · 3 of 28 frames shown (4 of 9)]
[frame 5/28]
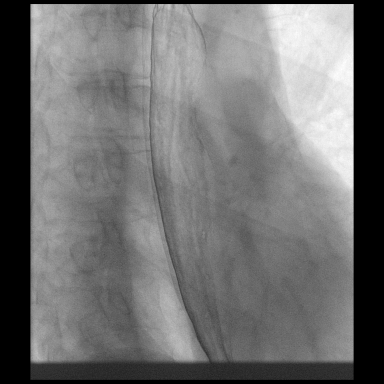
[frame 15/28]
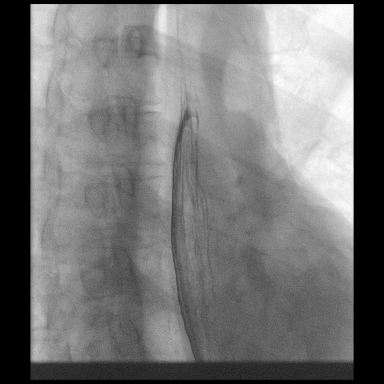
[frame 24/28]
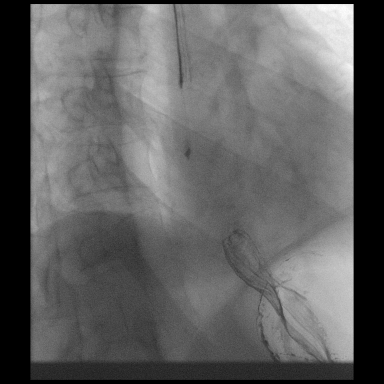

[Series 5: cp_standard · 0.35mm/px · 2 of 37 frames shown (5 of 9)]
[frame 6/37]
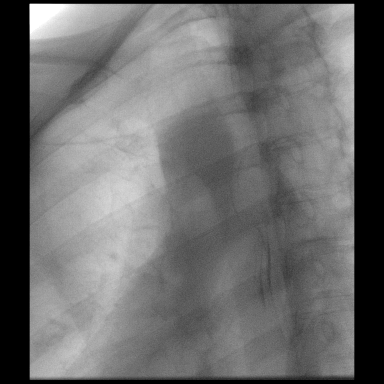
[frame 19/37]
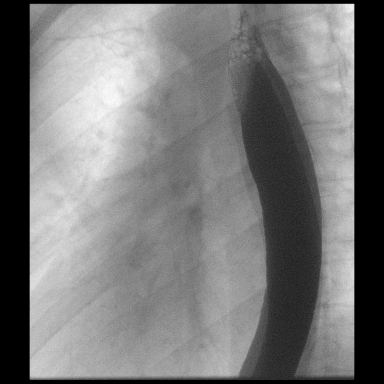

[Series 6: cp_standard · 0.35mm/px · 3 of 37 frames shown (6 of 9)]
[frame 6/37]
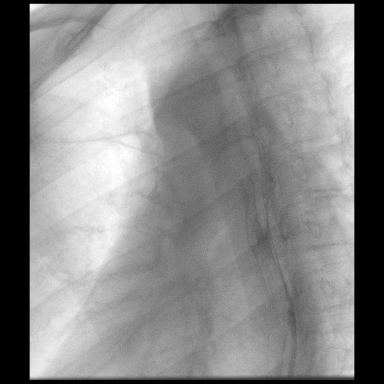
[frame 19/37]
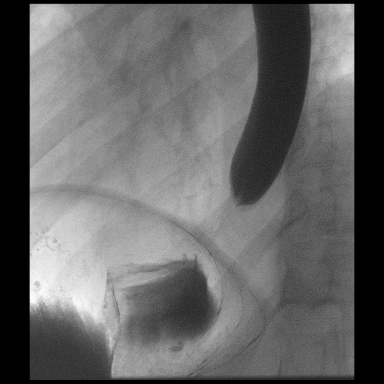
[frame 32/37]
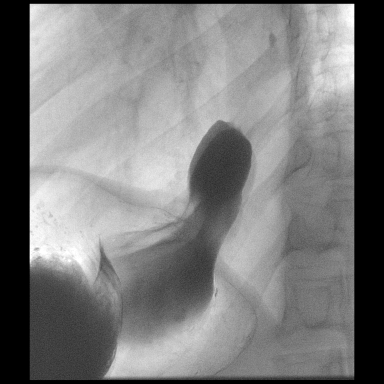

[Series 7: cp_standard · 0.35mm/px · 3 of 42 frames shown (7 of 9)]
[frame 7/42]
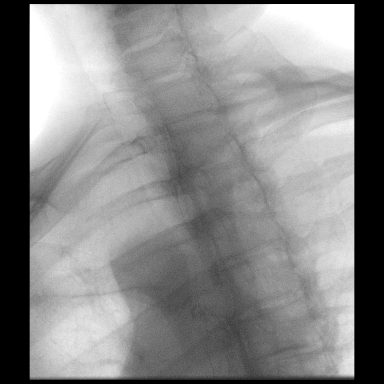
[frame 34/42]
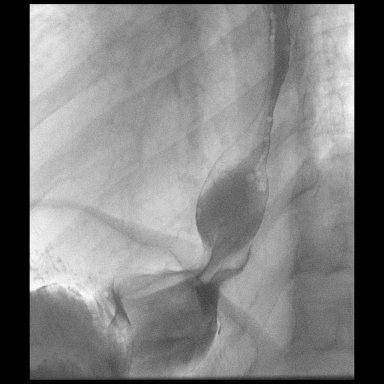
[frame 36/42]
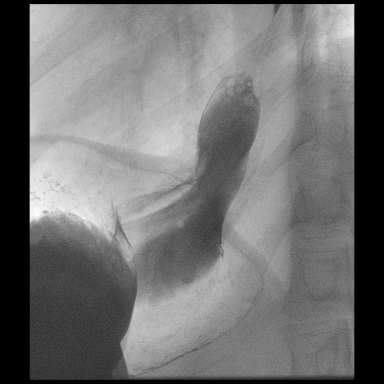

[Series 8: cp_standard · 0.35mm/px · 2 of 44 frames shown (8 of 9)]
[frame 20/44]
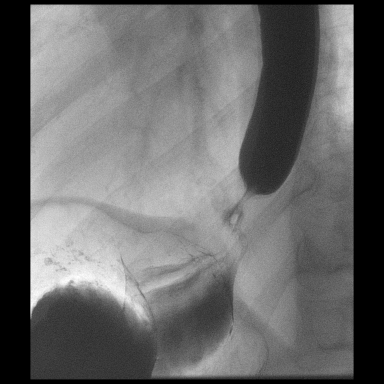
[frame 23/44]
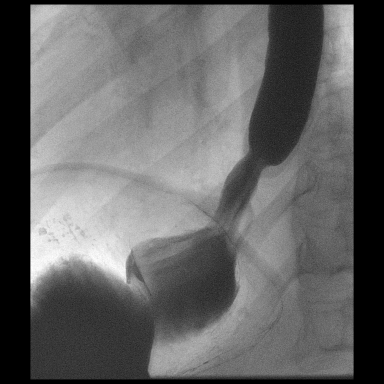

[Series 9: cp_standard · 0.34mm/px · 3 of 6 frames shown (9 of 9)]
[frame 1/6]
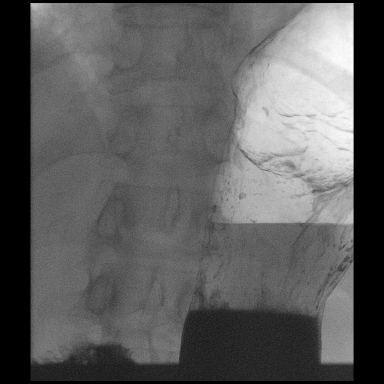
[frame 3/6]
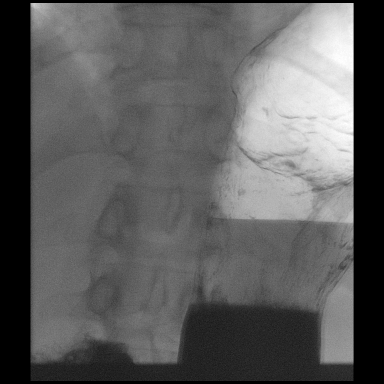
[frame 6/6]
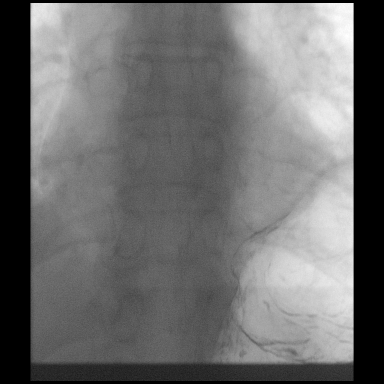

[24 of 24 positions shown; findings below may reference images not displayed]

Cervical spondylosis is present.

Primary peristaltic waves in the esophagus were normal on [DATE]
swallows. Double contrast appearance of the esophagus demonstrates
no significant abnormal esophageal mucosal ulceration, and no
significant distal esophageal mucosal fold thickening.

There is a small type 1 hiatal hernia as shown for example on image
[DATE].

No esophageal stricture or mass lesion is appreciated. A 13 mm
barium tablet passed without difficulty into the stomach.
FINDINGS: 1. Small type 1 hiatal hernia.  Otherwise normal esophagram.

## 2019-05-31 ENCOUNTER — Encounter: Payer: Self-pay | Admitting: Obstetrics and Gynecology

## 2019-10-31 ENCOUNTER — Ambulatory Visit: Payer: BLUE CROSS/BLUE SHIELD | Admitting: Obstetrics and Gynecology

## 2019-12-05 NOTE — Progress Notes (Signed)
64 y.o. G1P0010 Single White or Caucasian Not Hispanic or Latino female here for annual exam. H/O BSO. On HRT, doing fine. She was seen at [facility] last year because of  Insurance, got her HRT there. Needs a refill of HRT and Celexa. No c/o.     Patient's last menstrual period was 07/28/2008.          Sexually active: No.  The current method of family planning is post menopausal status.    Exercising: No.  The patient does not participate in regular exercise at present. Smoker:  no  Health Maintenance: Pap:  08/06/2017 Normal HPV Neg, 08-19-16 atypical grandular cells NEG HR HPV  (negative w/u) History of abnormal Pap:  Yes  h/o cryosurgery in 20s/ colposcopy 09-04-16  MMG:  05/31/19 Bi-rads 1 Neg Density D  BMD: 15  Years ago  Colonoscopy:   01/19/2012 Normal repeat in 10 years  TDaP:  2012 Gardasil: NA   reports that she has never smoked. She has never used smokeless tobacco. She reports current alcohol use of about 5.0 standard drinks of alcohol per week. She reports that she does not use drugs. Retired Scientist, physiological.    Past Medical History:  Diagnosis Date  . Amenorrhea   . Anemia    history of at age 23  . Arthritis    bilateral hands  . Breast nodule 09/2006   no mass, patient denies being told about this issue 09/18/2016  . Cancer ([facility])    melanoma  . Depression   . Hx of melanoma of skin 2011   left foot   . Mood disorder ([place])   . PONV (postoperative nausea and vomiting)     Past Surgical History:  Procedure Laterality Date  . BUNIONECTOMY     right  . COLONOSCOPY    . DILATATION & CURETTAGE/HYSTEROSCOPY WITH MYOSURE N/A 09/22/2016   Procedure: DILATATION & CURETTAGE/HYSTEROSCOPY WITH MYOSURE (possible Myosure);  Surgeon: Salvadore Dom, MD;  Location: [facility];  Service: Gynecology;  Laterality: N/A;  follow first case  . LAPAROSCOPIC BILATERAL SALPINGO OOPHERECTOMY Bilateral 09/04/2015   Procedure: LAPAROSCOPIC BILATERAL SALPINGO OOPHORECTOMY with pelvic washings;   Surgeon: Salvadore Dom, MD;  Location: [facility];  Service: Gynecology;  Laterality: Bilateral;  . MELANOMA EXCISION  2012   left foot   . OVARIAN CYST REMOVAL      Current Outpatient Medications  Medication Sig Dispense Refill  . aspirin EC 81 MG tablet Take 81 mg by mouth every evening.     . calcium citrate-vitamin D (CITRACAL+D) 315-200 MG-UNIT tablet Take 1 tablet by mouth 2 (two) times daily.    . citalopram (CELEXA) 20 MG tablet TAKE ONE TABLET BY MOUTH ONE TIME DAILY 90 tablet 0  . DiphenhydrAMINE HCl 50 MG/30ML LIQD Take 50 mg by mouth at bedtime as needed (sleep).    . norethindrone-ethinyl estradiol (FEMHRT LOW DOSE) 0.5-2.5 MG-MCG tablet Take 1 tablet by mouth daily.    . cetirizine (ZYRTEC) 10 MG tablet Take by mouth.    . Cholecalciferol 25 MCG (1000 UT) capsule Take by mouth.     No current facility-administered medications for this visit.    Family History  Problem Relation Age of Onset  . Macular degeneration Mother   . Hypertension Mother   . Diabetes Father   . Heart attack Father        x 2  . Heart disease Father   . Alcohol abuse Sister   . Alcohol abuse Brother   .  Deep vein thrombosis Brother        disable and immoble also nephew  . Ovarian cancer Sister   . Lung cancer Unknown   . Colon cancer Unknown     Review of Systems  All other systems reviewed and are negative.   Exam:   BP 128/66   Pulse 78   Temp 98.6 F (37 C)   Ht 5\' 4"  (1.626 m)   Wt 141 lb (64 kg)   LMP 07/28/2008   SpO2 99%   BMI 24.20 kg/m   Weight change: @WEIGHTCHANGE @ Height:   Height: 5\' 4"  (162.6 cm)  Ht Readings from Last 3 Encounters:  12/07/19 5\' 4"  (1.626 m)  08/06/17 5\' 4"  (1.626 m)  10/16/16 5' 4.25" (1.632 m)    General appearance: alert, cooperative and appears stated age Head: Normocephalic, without obvious abnormality, atraumatic Neck: no adenopathy, supple, symmetrical, trachea midline and thyroid normal to inspection and palpation Lungs: clear to  auscultation bilaterally Cardiovascular: regular rate and rhythm Breasts: normal appearance, no masses or tenderness Abdomen: soft, non-tender; non distended,  no masses,  no organomegaly Extremities: extremities normal, atraumatic, no cyanosis or edema Skin: Skin color, texture, turgor normal. No rashes or lesions Lymph nodes: Cervical, supraclavicular, and axillary nodes normal. No abnormal inguinal nodes palpated Neurologic: Grossly normal   Pelvic: External genitalia:  no lesions              Urethra:  normal appearing urethra with no masses, tenderness or lesions              Bartholins and Skenes: normal                 Vagina: atrophic appearing vagina with normal color and discharge, no lesions              Cervix: no lesions               Bimanual Exam:  Uterus:  normal size, contour, position, consistency, mobility, non-tender              Adnexa: no mass, fullness, tenderness               Rectovaginal: Confirms               Anus:  normal sphincter tone, no lesions  Gae Dry chaperoned for the exam.  A:  Well Woman with normal exam  PMP on HRT, wants to continue  Moody DM  FH ovarian cancer, patient has had BSO  P:   Pap with hpv  Screening labs, HgbA1C  Discussed breast self exam  Discussed calcium and vit D intake  Refill HRT and Celexa

## 2019-12-06 ENCOUNTER — Other Ambulatory Visit: Payer: Self-pay

## 2019-12-07 ENCOUNTER — Encounter: Payer: Self-pay | Admitting: Obstetrics and Gynecology

## 2019-12-07 ENCOUNTER — Ambulatory Visit: Payer: 59 | Admitting: Obstetrics and Gynecology

## 2019-12-07 ENCOUNTER — Other Ambulatory Visit (HOSPITAL_COMMUNITY)
Admission: RE | Admit: 2019-12-07 | Discharge: 2019-12-07 | Disposition: A | Discharge status: Home / Self Care | Payer: 59 | Source: Ambulatory Visit | Attending: Obstetrics and Gynecology | Admitting: Obstetrics and Gynecology

## 2019-12-07 VITALS — BP 128/66 | HR 78 | Temp 98.6°F | Ht 64.0 in | Wt 141.0 lb

## 2019-12-07 DIAGNOSIS — Z0000 Encounter for general adult medical examination without abnormal findings: Secondary | ICD-10-CM | POA: Diagnosis not present

## 2019-12-07 DIAGNOSIS — Z833 Family history of diabetes mellitus: Secondary | ICD-10-CM

## 2019-12-07 DIAGNOSIS — Z124 Encounter for screening for malignant neoplasm of cervix: Secondary | ICD-10-CM

## 2019-12-07 DIAGNOSIS — Z01419 Encounter for gynecological examination (general) (routine) without abnormal findings: Secondary | ICD-10-CM

## 2019-12-07 MED ORDER — NORETHINDRONE-ETH ESTRADIOL 0.5-2.5 MG-MCG PO TABS: 1.0000 | ORAL_TABLET | Freq: Every day | ORAL | 4 refills | Status: DC

## 2019-12-07 MED ORDER — CITALOPRAM HYDROBROMIDE 20 MG PO TABS: ORAL_TABLET | ORAL | 4 refills | Status: DC

## 2019-12-07 NOTE — Patient Instructions (Signed)

## 2019-12-08 LAB — COMPREHENSIVE METABOLIC PANEL
ALT: 8 IU/L (ref 0–32)
AST: 20 IU/L (ref 0–40)
Albumin/Globulin Ratio: 2 (ref 1.2–2.2)
Albumin: 4.7 g/dL (ref 3.8–4.8)
Alkaline Phosphatase: 71 IU/L (ref 39–117)
BUN/Creatinine Ratio: 17 (ref 12–28)
BUN: 18 mg/dL (ref 8–27)
Bilirubin Total: 0.4 mg/dL (ref 0.0–1.2)
CO2: 23 mmol/L (ref 20–29)
Calcium: 9.3 mg/dL (ref 8.7–10.3)
Chloride: 105 mmol/L (ref 96–106)
Creatinine, Ser: 1.07 mg/dL — ABNORMAL HIGH (ref 0.57–1.00)
GFR calc Af Amer: 63 mL/min/{1.73_m2} (ref >59)
GFR calc non Af Amer: 55 mL/min/{1.73_m2} — ABNORMAL LOW (ref >59)
Globulin, Total: 2.3 g/dL (ref 1.5–4.5)
Glucose: 101 mg/dL — ABNORMAL HIGH (ref 65–99)
Potassium: 4.1 mmol/L (ref 3.5–5.2)
Sodium: 141 mmol/L (ref 134–144)
Total Protein: 7 g/dL (ref 6.0–8.5)

## 2019-12-08 LAB — CYTOLOGY - PAP
Comment: NEGATIVE
Diagnosis: NEGATIVE
High risk HPV: NEGATIVE

## 2019-12-08 LAB — CBC
Hematocrit: 39.8 % (ref 34.0–46.6)
Hemoglobin: 13.6 g/dL (ref 11.1–15.9)
MCH: 32.5 pg (ref 26.6–33.0)
MCHC: 34.2 g/dL (ref 31.5–35.7)
MCV: 95 fL (ref 79–97)
Platelets: 255 10*3/uL (ref 150–450)
RBC: 4.19 x10E6/uL (ref 3.77–5.28)
RDW: 12.1 % (ref 11.7–15.4)
WBC: 6.6 10*3/uL (ref 3.4–10.8)

## 2019-12-08 LAB — LIPID PANEL
Chol/HDL Ratio: 3.5 ratio (ref 0.0–4.4)
Cholesterol, Total: 189 mg/dL (ref 100–199)
HDL: 54 mg/dL (ref >39)
LDL Chol Calc (NIH): 121 mg/dL — ABNORMAL HIGH (ref 0–99)
Triglycerides: 77 mg/dL (ref 0–149)
VLDL Cholesterol Cal: 14 mg/dL (ref 5–40)

## 2019-12-08 LAB — HEMOGLOBIN A1C
Est. average glucose Bld gHb Est-mCnc: 114 mg/dL
Hgb A1c MFr Bld: 5.6 % (ref 4.8–5.6)

## 2019-12-09 ENCOUNTER — Telehealth: Payer: Self-pay

## 2019-12-09 DIAGNOSIS — Z0000 Encounter for general adult medical examination without abnormal findings: Secondary | ICD-10-CM

## 2019-12-09 NOTE — Telephone Encounter (Signed)
Left message for pt to return call to triage RN. 

## 2019-12-09 NOTE — Telephone Encounter (Signed)
-----   Message from Salvadore Dom, MD sent at 12/08/2019  8:21 PM EDT ----- Please let the patient know that her renal function was mildly abnormal and her LDL (bad cholesterol) was mildly elevated.  Her HgbA1C is just in the normal range.  I would recommend that she return for a renal panel well hydrated (please order and set up an appointment) She should eat a healthy diet low in saturated fats and exercise regularly. I would recommend rechecking her lipid panel and HgbA1C next year.  Other lab work is normal. 36 month pap recall

## 2019-12-12 ENCOUNTER — Other Ambulatory Visit: Payer: Self-pay | Admitting: Obstetrics and Gynecology

## 2019-12-12 DIAGNOSIS — R7989 Other specified abnormal findings of blood chemistry: Secondary | ICD-10-CM

## 2019-12-12 NOTE — Progress Notes (Signed)
Cancelled CMP per Dr Talbert Nan.

## 2019-12-12 NOTE — Addendum Note (Signed)
Addended by: Georgia Lopes on: 12/12/2019 02:37 PM   Modules accepted: Orders

## 2019-12-12 NOTE — Telephone Encounter (Signed)
Spoke with pt. Pt given results and recommendations per Dr Talbert Nan. Pt agreeable and verbalized understanding. Pt scheduled for repeat CMP on 12/20/19 at 1:15pm and will be hydrated. Pt agreeable to appt date and time.   CMP future orders placed.  Routing to Dr Talbert Nan for review. Encounter closed.  36 recall placed.

## 2019-12-12 NOTE — Telephone Encounter (Signed)
Patient is returning call to Stephanie.

## 2019-12-20 ENCOUNTER — Other Ambulatory Visit: Payer: Self-pay | Admitting: *Deleted

## 2019-12-20 ENCOUNTER — Other Ambulatory Visit (INDEPENDENT_AMBULATORY_CARE_PROVIDER_SITE_OTHER): Payer: 59

## 2019-12-20 ENCOUNTER — Other Ambulatory Visit: Payer: Self-pay

## 2019-12-20 DIAGNOSIS — R7989 Other specified abnormal findings of blood chemistry: Secondary | ICD-10-CM

## 2019-12-21 LAB — RENAL FUNCTION PANEL
Albumin: 4.5 g/dL (ref 3.8–4.8)
BUN/Creatinine Ratio: 16 (ref 12–28)
BUN: 15 mg/dL (ref 8–27)
CO2: 21 mmol/L (ref 20–29)
Calcium: 9.1 mg/dL (ref 8.7–10.3)
Chloride: 104 mmol/L (ref 96–106)
Creatinine, Ser: 0.96 mg/dL (ref 0.57–1.00)
GFR calc Af Amer: 72 mL/min/{1.73_m2} (ref >59)
GFR calc non Af Amer: 63 mL/min/{1.73_m2} (ref >59)
Glucose: 86 mg/dL (ref 65–99)
Phosphorus: 3.2 mg/dL (ref 3.0–4.3)
Potassium: 4.6 mmol/L (ref 3.5–5.2)
Sodium: 139 mmol/L (ref 134–144)

## 2020-06-05 ENCOUNTER — Encounter: Payer: Self-pay | Admitting: Obstetrics and Gynecology

## 2020-06-06 ENCOUNTER — Encounter: Payer: Self-pay | Admitting: Family Medicine

## 2020-06-06 ENCOUNTER — Other Ambulatory Visit: Payer: Self-pay

## 2020-06-06 ENCOUNTER — Ambulatory Visit: Payer: 59 | Admitting: Family Medicine

## 2020-06-06 VITALS — BP 108/60 | HR 73 | Temp 99.3°F | Ht 64.0 in | Wt 139.0 lb

## 2020-06-06 DIAGNOSIS — G4700 Insomnia, unspecified: Secondary | ICD-10-CM | POA: Diagnosis not present

## 2020-06-06 DIAGNOSIS — Z23 Encounter for immunization: Secondary | ICD-10-CM | POA: Diagnosis not present

## 2020-06-06 DIAGNOSIS — M1990 Unspecified osteoarthritis, unspecified site: Secondary | ICD-10-CM

## 2020-06-06 DIAGNOSIS — Z85820 Personal history of malignant melanoma of skin: Secondary | ICD-10-CM | POA: Diagnosis not present

## 2020-06-06 NOTE — Progress Notes (Signed)
Kendra Hoffman DOB: Oct 07, 1955 Encounter date: 06/06/2020  This is a 64 y.o. female who presents to establish care. Chief Complaint  Patient presents with  . Establish Care    History of present illness: Sleep comes difficult for her. Was on ambien for years; doesn't want to be on anything like that. Trouble falling asleep. Wakes up to go to the bathroom and going back to sleep is an issue. Takes her forever to fall asleep. Takes benadryl before bed. Helps a little - feels a little more drowsy.   Takes zyrtec for allergies daily. Held off when taking benadryl. Zyrtec tends to work well by itself.   Arthritis: hands are worst problem. Maybe some in elbows; but may be tendonitis. Gets some swelling. Can stop her from activities. When taking trips; fingers get sore driving. Other joints not really as bad.   States that she needs to get back to exercising.   Taking citalopram - feels like she is stable with mood. Would like to try to decrease.   Follows with Dr. Talbert Nan for gyn needs. hpv negative 11/2019; pap normal. Last mammogram was 05/2020.   She did have both shingles [facility]inations [facility] [place]    Past Medical History:  Diagnosis Date  . Amenorrhea   . Anemia    history of at age 68  . Arthritis    bilateral hands  . Breast nodule 09/2006   no mass, patient denies being told about this issue 09/18/2016  . Cancer ([place])    melanoma  . Depression   . Hx of melanoma of skin 2011   left foot   . Mood disorder ([place])   . PONV (postoperative nausea and vomiting)    Past Surgical History:  Procedure Laterality Date  . BUNIONECTOMY     right  . COLONOSCOPY    . DILATATION & CURETTAGE/HYSTEROSCOPY WITH MYOSURE N/A 09/22/2016   Procedure: DILATATION & CURETTAGE/HYSTEROSCOPY WITH MYOSURE (possible Myosure);  Surgeon: Salvadore Dom, MD;  Location: [facility];  Service: Gynecology;  Laterality: N/A;  follow first case  . LAPAROSCOPIC BILATERAL SALPINGO OOPHERECTOMY  Bilateral 09/04/2015   Procedure: LAPAROSCOPIC BILATERAL SALPINGO OOPHORECTOMY with pelvic washings;  Surgeon: Salvadore Dom, MD;  Location: [facility];  Service: Gynecology;  Laterality: Bilateral;  . MELANOMA EXCISION  2012   left foot   . OVARIAN CYST REMOVAL     Allergies  Allergen Reactions  . Alpha-Gal   . Bactrim [Sulfamethoxazole-Trimethoprim] Rash  . Sulfamethoxazole Rash   Current Meds  Medication Sig  . aspirin EC 81 MG tablet Take 81 mg by mouth every evening.   . calcium citrate-vitamin D (CITRACAL+D) 315-200 MG-UNIT tablet Take 1 tablet by mouth 2 (two) times daily.  . cetirizine (ZYRTEC) 10 MG tablet Take by mouth.  . Cholecalciferol 25 MCG (1000 UT) capsule Take by mouth.  . citalopram (CELEXA) 20 MG tablet TAKE ONE TABLET BY MOUTH ONE TIME DAILY  . norethindrone-ethinyl estradiol (FEMHRT LOW DOSE) 0.5-2.5 MG-MCG tablet Take 1 tablet by mouth daily.   Social History   Tobacco Use  . Smoking status: Never Smoker  . Smokeless tobacco: Never Used  Substance Use Topics  . Alcohol use: Yes    Alcohol/week: 5.0 standard drinks    Types: 5 Glasses of wine per week   Family History  Problem Relation Age of Onset  . Macular degeneration Mother   . Hypertension Mother   . Dementia Mother   . Atrial fibrillation Mother   .  Hyperlipidemia Mother   . Diabetes Father   . Heart attack Father        x 2  . Heart disease Father   . Arthritis Father        mostly in back; shoulder  . Hyperlipidemia Father   . Hypertension Father   . Kidney disease Father   . Alcohol abuse Sister   . Arthritis Sister   . Depression Sister   . Drug abuse Sister   . Alcohol abuse Brother   . Deep vein thrombosis Brother        disable and immoble also nephew  . Ovarian cancer Sister 22  . Lung cancer Other   . Colon cancer Other   . Colon cancer Maternal Grandfather 69  . Diabetes Paternal Grandmother   . Heart attack Paternal Grandmother 55  . Throat cancer Paternal Grandfather         smoker     Review of Systems  Constitutional: Negative for chills, fatigue and fever.  Respiratory: Negative for cough, chest tightness, shortness of breath and wheezing.   Cardiovascular: Negative for chest pain, palpitations and leg swelling.    Objective:  BP 108/60 (BP Location: Left Arm, Patient Position: Sitting, Cuff Size: Normal)   Pulse 73   Temp 99.3 F (37.4 C) (Oral)   Ht 5\' 4"  (1.626 m)   Wt 139 lb (63 kg)   LMP 07/28/2008   BMI 23.86 kg/m   Weight: 139 lb (63 kg)   BP Readings from Last 3 Encounters:  06/06/20 108/60  12/07/19 128/66  08/06/17 110/72   Wt Readings from Last 3 Encounters:  06/06/20 139 lb (63 kg)  12/07/19 141 lb (64 kg)  08/06/17 133 lb (60.3 kg)    Physical Exam Constitutional:      General: She is not in acute distress.    Appearance: She is well-developed.  Cardiovascular:     Rate and Rhythm: Normal rate and regular rhythm.     Heart sounds: Normal heart sounds. No murmur heard.  No friction rub.  Pulmonary:     Effort: Pulmonary effort is normal. No respiratory distress.     Breath sounds: Normal breath sounds. No wheezing or rales.  Musculoskeletal:     Right lower leg: No edema.     Left lower leg: No edema.  Neurological:     Mental Status: She is alert and oriented to person, place, and time.  Psychiatric:        Behavior: Behavior normal.     Assessment/Plan:  1. Insomnia, unspecified type She is going to try melatonin, but if that is not effective, we discussed trazodone. She will let me know. We discussed risks of sleep medications and limits at and after age 80.   2. Hx of melanoma of skin She does follow regularly with dermatology.   3. Arthritis We discussed some more natural options like tart cherry juice, turmeric, glucosamine, but we discussed limited studies showing benefit. We discussed voltaren gel as option if trying to avoid oral anti-inflammatories.     Return in about 6 months (around  12/04/2020) for physical exam.  Micheline Rough, MD

## 2020-06-06 NOTE — Patient Instructions (Addendum)
Try extended release melatonin - up to 10mg . Let me know if this doesn't work and we can try the trazodone for sleep.   Tart cherry capsules/juice can help with arthritis.

## 2020-06-07 ENCOUNTER — Telehealth: Payer: Self-pay

## 2020-06-07 NOTE — Telephone Encounter (Signed)
Spoke with pt. Pt given update and recommendations per Dr Talbert Nan. Pt states unsure what will be covered that was offered due to insurance change to Medicare in Jan. Pt will continue [facility] for now and will call back in Jan with update on new Rx. Pt thankful for advice.  Encounter closed No Rx sent.

## 2020-06-07 NOTE — Telephone Encounter (Signed)
She can always go on generic oral estrogen and a separate progesterone (2 tablets), I would imagine prempro would be covered. Or she could do a generic patch with oral progesterone.  She got the [facility] from [facility], in the past she did use a patch. The patch decreases the risk of HRT some, so if that was working for her it might be worth trying the patch again.

## 2020-06-07 NOTE — Telephone Encounter (Signed)
Left message to call Roddie Riegler, CMA. °

## 2020-06-07 NOTE — Telephone Encounter (Signed)
Patient calling stating she will go on Medicare--[employer], the first of the year. She states according to her Medicare list her HRT therapy--FMHrt 0.5-2.5 will not be covered. Patient wants to know if Dr.Jertson has an alternative which may be covered on Medicare.  Advised patient every plan is different and we may not know until we order in computer.   Advised she may have to call after she is on Medicare and we try to send in Rx to see.  Will ask Dr.Jertson for recommendations.  AEX 12-07-19, MMG 05-31-19 Neg/BiRads1 Rt. To provider

## 2020-06-07 NOTE — Telephone Encounter (Signed)
Patient is calling in regards to discussing HRT alternatives.

## 2020-08-22 ENCOUNTER — Telehealth: Payer: Self-pay | Admitting: *Deleted

## 2020-08-22 NOTE — Telephone Encounter (Addendum)
patent called back and explained best to contact insurance company to provide her with a list and let me know I can relay to [place]. She will do this and call me back.

## 2020-08-22 NOTE — Telephone Encounter (Signed)
Patient called left message in triage voicemail c/o Femrt low dose 0.5-2.5 mcg tablet is not covered by insurance. I called patient and left message for her to call.

## 2020-08-28 NOTE — Telephone Encounter (Signed)
[facility] left message asking for call back regarding patient's estradiol request.  [phone] option # 5

## 2020-08-29 NOTE — Telephone Encounter (Signed)
That's fine, please refill her scripts until her annual exam. Thanks!

## 2020-08-29 NOTE — Telephone Encounter (Signed)
Dr.Jertson patient insurance would not pay for Femhrt low dose 0.5-2.5 mg tablet. Patient said she would like to start back on estradiol (CLIMARA) 0.0375 mg/24hr patch and medroxyPROGESTERone (PROVERA) 2.5 MG tablet. Annual exam due in May. Please advise

## 2020-08-30 MED ORDER — MEDROXYPROGESTERONE ACETATE 2.5 MG PO TABS
2.5000 mg | ORAL_TABLET | Freq: Every day | ORAL | 0 refills | Status: DC
Start: 2020-08-30 — End: 2020-08-30

## 2020-08-30 MED ORDER — ESTRADIOL 0.0375 MG/24HR TD PTWK
0.0375 mg | MEDICATED_PATCH | TRANSDERMAL | 0 refills | Status: DC
Start: 2020-08-30 — End: 2020-08-30

## 2020-08-30 MED ORDER — ESTRADIOL 0.0375 MG/24HR TD PTWK: 0.0375 mg | MEDICATED_PATCH | TRANSDERMAL | 0 refills | Status: DC

## 2020-08-30 MED ORDER — MEDROXYPROGESTERONE ACETATE 2.5 MG PO TABS: 2.5000 mg | ORAL_TABLET | Freq: Every day | ORAL | 0 refills | Status: DC

## 2020-08-30 NOTE — Addendum Note (Signed)
Addended by: Thamas Jaegers on: 08/30/2020 12:06 PM   Modules accepted: Orders

## 2020-08-30 NOTE — Telephone Encounter (Signed)
Left on voicemail Rx sent. Annual exam scheduled on 12/12/20

## 2020-08-30 NOTE — Telephone Encounter (Signed)
Patient called back Rx was sent to incorrect pharmacy. Patient wanted Rx [facility] to [facility] [address]. Rx sent.

## 2020-09-03 ENCOUNTER — Telehealth: Payer: Self-pay

## 2020-09-03 NOTE — Telephone Encounter (Signed)
Pharmacy called to clarify the directions sent in with the Estradiol patch. They said it was prescribed once weekly but is intended to be use twice weekly. I looked back in patient's med history as it is a refill and it looks that she has always used it once weekly.  Pharmacy informed.

## 2020-10-16 ENCOUNTER — Other Ambulatory Visit: Payer: Self-pay | Admitting: Obstetrics and Gynecology

## 2020-10-16 ENCOUNTER — Telehealth: Payer: Self-pay | Admitting: Obstetrics and Gynecology

## 2020-10-16 NOTE — Telephone Encounter (Signed)
Can you please check when her last mammogram was done at [facility]. The last one we have is 11/20. If she has a more recent one, please get a copy. If not then please ask her to schedule one.

## 2020-10-16 NOTE — Telephone Encounter (Signed)
Last AEX 12/07/19. Scheduled AEX 12/12/20. 

## 2020-10-18 ENCOUNTER — Telehealth: Payer: Self-pay | Admitting: *Deleted

## 2020-10-18 NOTE — Telephone Encounter (Signed)
The climara 0.0375 weekly and the vivelle dot 0.0375 2x a week are the same dose. Either one is fine, it is up to her preference. Please send in the climara if she prefers that. I'm sorry for the confusion.

## 2020-10-18 NOTE — Telephone Encounter (Signed)
Call placed to [facility] report requested.

## 2020-10-18 NOTE — Telephone Encounter (Signed)
Patient called stating the incorrect patch was sent to pharmacy. Patient has been using climara weekly patch 0.0375 mg see (08/22/20 telephone counter) on 10/17/19 you discontinued the climara and approved the vivelle dot 0.0375 mg weekly patch. Patient asked if she could have Rx for climara weekly patch? Please advise

## 2020-10-19 MED ORDER — ESTRADIOL 0.0375 MG/24HR TD PTWK: 0.0375 mg | MEDICATED_PATCH | TRANSDERMAL | 0 refills | Status: DC

## 2020-10-19 NOTE — Telephone Encounter (Signed)
Left detailed message on cell Rx sent.

## 2020-11-25 ENCOUNTER — Other Ambulatory Visit: Payer: Self-pay | Admitting: Obstetrics and Gynecology

## 2020-11-26 NOTE — Telephone Encounter (Signed)
Medication refill request: Provera Last AEX:  12-07-19 JJ  Next AEX: 12-12-20  Last MMG (if hormonal medication request): 06-05-20 density C/BIRADS 1 negative  Refill authorized: Today, please advise.   Medication pended for #30, 0RF. Please refill if appropriate.

## 2020-12-03 NOTE — Progress Notes (Signed)
65 y.o. G1P0010 Single White or Caucasian Not Hispanic or Latino female here for annual exam.  H/O BSO. Questions about HRT meds. She is more sensitive to the heat, not having hot flashes. Willing to go down on her dose, currently on climara 0.0375 mg patch and daily provera.  No vaginal bleeding.  No bowel c/o.  She has some urgency to void, occasionally will leak on the way to the bathroom or with valsalva with a full bladder. Just started in the last year. Leaks small amounts. She drinks up to 24 oz of coffee q am.     Patient's last menstrual period was 07/28/2008.          Sexually active: No. The current method of family planning is post menopausal status. Exercising: No. Smoker:  No.  Health Maintenance: Pap:  12/07/19 Normal. HPV Neg. History of abnormal Pap: Yes h/o cryosurgery in 20s/ colposcopy 09-04-16 MMG:  06/05/20 BiRads 1 Neg Density C BMD:   15 yrs ago  Colonoscopy:  01/19/2012 Normal repeat in 10 years  TDaP:  2012   reports that she has never smoked. She has never used smokeless tobacco. She reports current alcohol use of about 5.0 standard drinks of alcohol per week. She reports that she does not use drugs. Retired Scientist, physiological. Her 70 year old Dad is still getting out with her.    Past Medical History:  Diagnosis Date  . Amenorrhea   . Anemia    history of at age 41  . Arthritis    bilateral hands  . Breast nodule 09/2006   no mass, patient denies being told about this issue 09/18/2016  . Cancer ([place])    melanoma  . Depression   . Hx of melanoma of skin 2011   left foot   . Mood disorder ([place])   . PONV (postoperative nausea and vomiting)     Past Surgical History:  Procedure Laterality Date  . BUNIONECTOMY     right  . COLONOSCOPY    . DILATATION & CURETTAGE/HYSTEROSCOPY WITH MYOSURE N/A 09/22/2016   Procedure: DILATATION & CURETTAGE/HYSTEROSCOPY WITH MYOSURE (possible Myosure);  Surgeon: Salvadore Dom, MD;  Location: [facility];  Service: Gynecology;   Laterality: N/A;  follow first case  . LAPAROSCOPIC BILATERAL SALPINGO OOPHERECTOMY Bilateral 09/04/2015   Procedure: LAPAROSCOPIC BILATERAL SALPINGO OOPHORECTOMY with pelvic washings;  Surgeon: Salvadore Dom, MD;  Location: [facility];  Service: Gynecology;  Laterality: Bilateral;  . MELANOMA EXCISION  2012   left foot   . OVARIAN CYST REMOVAL      Current Outpatient Medications  Medication Sig Dispense Refill  . aspirin EC 81 MG tablet Take 81 mg by mouth every evening.     . calcium citrate-vitamin D (CITRACAL+D) 315-200 MG-UNIT tablet Take 1 tablet by mouth 2 (two) times daily.    . cetirizine (ZYRTEC) 10 MG tablet Take by mouth.    . Cholecalciferol 25 MCG (1000 UT) capsule Take 2,000 Units by mouth.    . citalopram (CELEXA) 20 MG tablet TAKE ONE TABLET BY MOUTH ONE TIME DAILY 90 tablet 4  . estradiol (CLIMARA - DOSED IN MG/24 HR) 0.0375 mg/24hr patch Place 1 patch (0.0375 mg total) onto the skin once a week. 8 patch 0  . medroxyPROGESTERone (PROVERA) 2.5 MG tablet TAKE ONE TABLET BY MOUTH DAILY 30 tablet 0   No current facility-administered medications for this visit.    Family History  Problem Relation Age of Onset  . Macular degeneration Mother   .  Hypertension Mother   . Dementia Mother   . Atrial fibrillation Mother   . Hyperlipidemia Mother   . Arthritis Mother   . Depression Mother   . Hearing loss Mother   . Heart disease Mother   . Miscarriages / [place] Mother   . Diabetes Father   . Heart attack Father        x 2  . Heart disease Father   . Arthritis Father        mostly in back; shoulder  . Hyperlipidemia Father   . Hypertension Father   . Kidney disease Father   . Depression Father   . Hearing loss Father   . Renal Disease Father   . Alcohol abuse Sister   . Arthritis Sister   . Depression Sister   . Drug abuse Sister   . Hearing loss Sister   . Hypertension Sister   . Learning disabilities Sister   . Other Sister        blood disorder - not  hemochromotosis but similar requiring regular phlebotomy  . Alcohol abuse Brother   . Deep vein thrombosis Brother        disable and immoble also nephew  . Arthritis Brother   . Drug abuse Brother   . Early death Brother   . Hypertension Brother   . Ovarian cancer Sister 69  . Cancer Sister   . Early death Sister   . Lung cancer Other   . Colon cancer Other   . Colon cancer Maternal Grandfather 15  . Cancer Maternal Grandfather   . Early death Maternal Grandfather   . Diabetes Paternal Grandmother   . Heart attack Paternal Grandmother 50  . Early death Paternal Grandmother   . Hearing loss Paternal Grandmother   . Heart disease Paternal Grandmother   . Hypertension Paternal Grandmother   . Throat cancer Paternal Grandfather        smoker  . Cancer Paternal Grandfather   . Depression Paternal Grandfather   . Early death Paternal Grandfather   . Colon cancer Paternal Grandfather   . Heart attack Maternal Grandmother   . Early death Maternal Grandmother     Review of Systems  Exam:   BP 112/70 (BP Location: Right Arm, Patient Position: Sitting, Cuff Size: Small)   Pulse 67   Ht 5\' 4"  (1.626 m)   Wt 136 lb (61.7 kg)   LMP 07/28/2008   SpO2 97%   BMI 23.34 kg/m   Weight change: @WEIGHTCHANGE @ Height:   Height: 5\' 4"  (162.6 cm)  Ht Readings from Last 3 Encounters:  12/12/20 5\' 4"  (1.626 m)  12/05/20 5' 4.5" (1.638 m)  06/06/20 5\' 4"  (1.626 m)    General appearance: alert, cooperative and appears stated age Head: Normocephalic, without obvious abnormality, atraumatic Neck: no adenopathy, supple, symmetrical, trachea midline and thyroid normal to inspection and palpation Lungs: clear to auscultation bilaterally Cardiovascular: regular rate and rhythm Breasts: normal appearance, no masses or tenderness Abdomen: soft, non-tender; non distended,  no masses,  no organomegaly Extremities: extremities normal, atraumatic, no cyanosis or edema Skin: Skin color, texture,  turgor normal. No rashes or lesions Lymph nodes: Cervical, supraclavicular, and axillary nodes normal. No abnormal inguinal nodes palpated Neurologic: Grossly normal   Pelvic: External genitalia:  no lesions              Urethra:  normal appearing urethra with no masses, tenderness or lesions  Bartholins and Skenes: normal                 Vagina: normal appearing vagina with normal color and discharge, no lesions              Cervix: no lesions               Bimanual Exam:  Uterus:  normal size, contour, position, consistency, mobility, non-tender              Adnexa: no mass, fullness, tenderness               Rectovaginal: Confirms               Anus:  normal sphincter tone, no lesions  Thurnell Garbe chaperoned for the exam.  1. Encounter for gynecological examination without abnormal finding Discussed breast self exam Discussed calcium and vit D intake No pap this year Colonoscopy in 6/23 Mammogram UTD  2. Mixed incontinence Cut back on caffeine Discussed kegels Information given on kegels and bladder training - Urinalysis,Complete w/RFL Culture  3. Screening for osteoporosis - DG Bone Density; Future  4. Hormone replacement therapy (HRT) Will decrease her dose, consider stopping in the next year - medroxyPROGESTERone (PROVERA) 2.5 MG tablet; Take 1 tablet (2.5 mg total) by mouth daily.  Dispense: 90 tablet; Refill: 3 - estradiol (CLIMARA) 0.025 mg/24hr patch; Place 1 patch (0.025 mg total) onto the skin once a week.  Dispense: 12 patch; Refill: 3  5. Postmenopausal  - DG Bone Density; Future

## 2020-12-04 ENCOUNTER — Other Ambulatory Visit: Payer: Self-pay

## 2020-12-05 ENCOUNTER — Encounter: Payer: Self-pay | Admitting: Family Medicine

## 2020-12-05 ENCOUNTER — Ambulatory Visit (INDEPENDENT_AMBULATORY_CARE_PROVIDER_SITE_OTHER): Payer: Medicare Other | Admitting: Family Medicine

## 2020-12-05 VITALS — BP 96/60 | HR 74 | Temp 99.5°F | Ht 64.5 in | Wt 137.5 lb

## 2020-12-05 DIAGNOSIS — Z0000 Encounter for general adult medical examination without abnormal findings: Secondary | ICD-10-CM

## 2020-12-05 DIAGNOSIS — R42 Dizziness and giddiness: Secondary | ICD-10-CM

## 2020-12-05 DIAGNOSIS — Z136 Encounter for screening for cardiovascular disorders: Secondary | ICD-10-CM

## 2020-12-05 DIAGNOSIS — R002 Palpitations: Secondary | ICD-10-CM

## 2020-12-05 DIAGNOSIS — Z13220 Encounter for screening for lipoid disorders: Secondary | ICD-10-CM | POA: Diagnosis not present

## 2020-12-05 DIAGNOSIS — Z8639 Personal history of other endocrine, nutritional and metabolic disease: Secondary | ICD-10-CM

## 2020-12-05 NOTE — Progress Notes (Signed)
Kendra Hoffman DOB: Oct 03, 1955 Encounter date: 12/05/2020  This is a 65 y.o. female who presents for complete physical   History of present illness/Additional concerns:  Had issue with vertigo - worse at night. She is interested in a heart check and just making sure we are aware of vertigo. Was weekend when she called - got on call provider. Night before was aware of everything swimming - like brain and eyes were not coordinated. Got up and moving next morning, but wasn't better so she called. She was ok if she was sitting down - so they told her to monitor and let them know if it didn't improve. Was better next day and two days after was fine. Had not had vertigo in past. Also had ache in left side of neck on day that it started. Some balance change - worse at night, when getting up from sleep. Tends to get up and go straight to where she needs to go. Is aware of dizziness with bending over quickly; turning head quickly. No headache she recalls. Some nausea. No sinus sx; no ear sx that she recalls. Currently does have some sinus congestion/allergy sx.   Sleep comes difficult for her. Was on ambien for years; doesn't want to be on anything like that. Trouble falling asleep. Wakes up to go to the bathroom and going back to sleep is an issue. Takes her forever to fall asleep. Takes benadryl before bed. Helps a little - feels a little more drowsy.   Takes zyrtec for allergies daily. Held off when taking benadryl. Zyrtec tends to work well by itself.   Arthritis: hands are worst problem. Worse in cold weather.    Does feel that she gets winded more easily than she should; attributes some to deconditioning. States with outdoor work she gets red in the face. Rarely will get a little flutter in chest; very infrequent. Has had this for years.   Taking citalopram - feels like she is stable with mood. Would like to try to decrease.   Follows with Dr. Talbert Nan for gyn needs. hpv negative 11/2019; pap  normal. Last mammogram was 05/2020.   She did have both shingles vaccinations last year. [facility] [place]. Last mammogram 05/2020  Repeat colonoscopy 2023.   Does follow with dermatology - seeing  derm  Past Medical History:  Diagnosis Date  . Amenorrhea   . Anemia    history of at age 55  . Arthritis    bilateral hands  . Breast nodule 09/2006   no mass, patient denies being told about this issue 09/18/2016  . Cancer ([place])    melanoma  . Depression   . Hx of melanoma of skin 2011   left foot   . Mood disorder ([place])   . PONV (postoperative nausea and vomiting)    Past Surgical History:  Procedure Laterality Date  . BUNIONECTOMY     right  . COLONOSCOPY    . DILATATION & CURETTAGE/HYSTEROSCOPY WITH MYOSURE N/A 09/22/2016   Procedure: DILATATION & CURETTAGE/HYSTEROSCOPY WITH MYOSURE (possible Myosure);  Surgeon: Salvadore Dom, MD;  Location: [facility];  Service: Gynecology;  Laterality: N/A;  follow first case  . LAPAROSCOPIC BILATERAL SALPINGO OOPHERECTOMY Bilateral 09/04/2015   Procedure: LAPAROSCOPIC BILATERAL SALPINGO OOPHORECTOMY with pelvic washings;  Surgeon: Salvadore Dom, MD;  Location: [facility];  Service: Gynecology;  Laterality: Bilateral;  . MELANOMA EXCISION  2012   left foot   . OVARIAN CYST REMOVAL     Allergies  Allergen  Reactions  . Alpha-Gal   . Bactrim [Sulfamethoxazole-Trimethoprim] Rash  . Sulfamethoxazole Rash   Current Meds  Medication Sig  . aspirin EC 81 MG tablet Take 81 mg by mouth every evening.   . calcium citrate-vitamin D (CITRACAL+D) 315-200 MG-UNIT tablet Take 1 tablet by mouth 2 (two) times daily.  . cetirizine (ZYRTEC) 10 MG tablet Take by mouth.  . Cholecalciferol 25 MCG (1000 UT) capsule Take 2,000 Units by mouth.  . citalopram (CELEXA) 20 MG tablet TAKE ONE TABLET BY MOUTH ONE TIME DAILY  . estradiol (CLIMARA - DOSED IN MG/24 HR) 0.0375 mg/24hr patch Place 1 patch (0.0375 mg total) onto the skin once a week.  .  medroxyPROGESTERone (PROVERA) 2.5 MG tablet TAKE ONE TABLET BY MOUTH DAILY   Social History   Tobacco Use  . Smoking status: Never Smoker  . Smokeless tobacco: Never Used  Substance Use Topics  . Alcohol use: Yes    Alcohol/week: 5.0 standard drinks    Types: 5 Glasses of wine per week   Family History  Problem Relation Age of Onset  . Macular degeneration Mother   . Hypertension Mother   . Dementia Mother   . Atrial fibrillation Mother   . Hyperlipidemia Mother   . Arthritis Mother   . Depression Mother   . Hearing loss Mother   . Heart disease Mother   . Miscarriages / [place] Mother   . Diabetes Father   . Heart attack Father        x 2  . Heart disease Father   . Arthritis Father        mostly in back; shoulder  . Hyperlipidemia Father   . Hypertension Father   . Kidney disease Father   . Depression Father   . Hearing loss Father   . Renal Disease Father   . Alcohol abuse Sister   . Arthritis Sister   . Depression Sister   . Drug abuse Sister   . Hearing loss Sister   . Hypertension Sister   . Learning disabilities Sister   . Other Sister        blood disorder - not hemochromotosis but similar requiring regular phlebotomy  . Alcohol abuse Brother   . Deep vein thrombosis Brother        disable and immoble also nephew  . Arthritis Brother   . Drug abuse Brother   . Early death Brother   . Hypertension Brother   . Ovarian cancer Sister 75  . Cancer Sister   . Early death Sister   . Lung cancer Other   . Colon cancer Other   . Colon cancer Maternal Grandfather 8  . Cancer Maternal Grandfather   . Early death Maternal Grandfather   . Diabetes Paternal Grandmother   . Heart attack Paternal Grandmother 24  . Early death Paternal Grandmother   . Hearing loss Paternal Grandmother   . Heart disease Paternal Grandmother   . Hypertension Paternal Grandmother   . Throat cancer Paternal Grandfather        smoker  . Cancer Paternal Grandfather   .  Depression Paternal Grandfather   . Early death Paternal Grandfather   . Colon cancer Paternal Grandfather   . Heart attack Maternal Grandmother   . Early death Maternal Grandmother      Review of Systems  Constitutional: Negative for activity change, appetite change, chills, fatigue, fever and unexpected weight change.  HENT: Negative for congestion, ear pain, hearing loss, sinus pressure, sinus pain,  sore throat and trouble swallowing.   Eyes: Negative for pain and visual disturbance.  Respiratory: Negative for cough, chest tightness, shortness of breath and wheezing.   Cardiovascular: Positive for palpitations (see hpi). Negative for chest pain and leg swelling.  Gastrointestinal: Negative for abdominal pain, blood in stool, constipation, diarrhea, nausea and vomiting.  Genitourinary: Negative for difficulty urinating and menstrual problem.  Musculoskeletal: Negative for arthralgias and back pain.  Skin: Negative for rash.  Neurological: Positive for dizziness (recent episode; see hpi). Negative for weakness, numbness and headaches.  Hematological: Negative for adenopathy. Does not bruise/bleed easily.  Psychiatric/Behavioral: Positive for sleep disturbance. Negative for suicidal ideas. The patient is not nervous/anxious.     CBC:  Lab Results  Component Value Date   WBC 5.3 12/05/2020   HGB 13.7 12/05/2020   HGB 13.6 12/07/2019   HCT 40.9 12/05/2020   HCT 39.8 12/07/2019   MCH 32.5 12/07/2019   MCH 32.1 09/18/2016   MCHC 33.5 12/05/2020   RDW 12.8 12/05/2020   RDW 12.1 12/07/2019   PLT 221.0 12/05/2020   PLT 255 12/07/2019   CMP: Lab Results  Component Value Date   NA 139 12/05/2020   NA 139 12/20/2019   K 3.9 12/05/2020   CL 104 12/05/2020   CO2 26 12/05/2020   ANIONGAP 9 08/24/2015   GLUCOSE 84 12/05/2020   BUN 13 12/05/2020   BUN 15 12/20/2019   CREATININE 0.91 12/05/2020   LABGLOB 2.3 12/07/2019   GFRAA 72 12/20/2019   CALCIUM 9.3 12/05/2020   PROT 6.7  12/05/2020   PROT 7.0 12/07/2019   AGRATIO 2.0 12/07/2019   BILITOT 0.7 12/05/2020   BILITOT 0.4 12/07/2019   ALKPHOS 70 12/05/2020   ALT 8 12/05/2020   AST 16 12/05/2020   LIPID: Lab Results  Component Value Date   CHOL 215 (H) 12/05/2020   CHOL 189 12/07/2019   TRIG 63.0 12/05/2020   HDL 55.80 12/05/2020   HDL 54 12/07/2019   LDLCALC 147 (H) 12/05/2020   LDLCALC 121 (H) 12/07/2019   LABVLDL 14 12/07/2019    Objective:  BP 96/60 (BP Location: Left Arm, Patient Position: Sitting, Cuff Size: Normal)   Pulse 74   Temp 99.5 F (37.5 C) (Oral)   Ht 5' 4.5" (1.638 m)   Wt 137 lb 8 oz (62.4 kg)   LMP 07/28/2008   SpO2 96%   BMI 23.24 kg/m   Weight: 137 lb 8 oz (62.4 kg)   BP Readings from Last 3 Encounters:  12/05/20 96/60  06/06/20 108/60  12/07/19 128/66   Wt Readings from Last 3 Encounters:  12/05/20 137 lb 8 oz (62.4 kg)  06/06/20 139 lb (63 kg)  12/07/19 141 lb (64 kg)    Physical Exam Constitutional:      General: She is not in acute distress.    Appearance: She is well-developed.  HENT:     Head: Normocephalic and atraumatic.     Right Ear: External ear normal.     Left Ear: External ear normal.     Mouth/Throat:     Pharynx: No oropharyngeal exudate.  Eyes:     Conjunctiva/sclera: Conjunctivae normal.     Pupils: Pupils are equal, round, and reactive to light.  Neck:     Thyroid: No thyromegaly.  Cardiovascular:     Rate and Rhythm: Normal rate and regular rhythm.     Heart sounds: Normal heart sounds. No murmur heard. No friction rub. No gallop.  Comments: No carotid bruits appreciated Pulmonary:     Effort: Pulmonary effort is normal.     Breath sounds: Normal breath sounds.  Abdominal:     General: Bowel sounds are normal. There is no distension.     Palpations: Abdomen is soft. There is no mass.     Tenderness: There is no abdominal tenderness. There is no guarding.     Hernia: No hernia is present.  Musculoskeletal:        General:  No tenderness or deformity. Normal range of motion.     Cervical back: Normal range of motion and neck supple.     Right lower leg: No edema.     Left lower leg: No edema.  Lymphadenopathy:     Cervical: No cervical adenopathy.  Skin:    General: Skin is warm and dry.     Findings: No rash.  Neurological:     Mental Status: She is alert and oriented to person, place, and time.     Deep Tendon Reflexes: Reflexes normal.     Reflex Scores:      Tricep reflexes are 2+ on the right side and 2+ on the left side.      Bicep reflexes are 2+ on the right side and 2+ on the left side.      Brachioradialis reflexes are 2+ on the right side and 2+ on the left side.      Patellar reflexes are 2+ on the right side and 2+ on the left side. Psychiatric:        Speech: Speech normal.        Behavior: Behavior normal.        Thought Content: Thought content normal.     Assessment/Plan: Health Maintenance Due  Topic Date Due  . COVID-19 Vaccine (3 - Booster for Pfizer series) 04/11/2020  . DEXA SCAN  Never done  . PNA vac Low Risk Adult (1 of 2 - PCV13) Never done  . TETANUS/TDAP  11/17/2020   Health Maintenance reviewed.  1. Preventative health care Encouraged patient to start exercise program.  Continue with healthy eating.  She is otherwise up-to-date with preventative health needs.  2. Vertigo From symptoms and description, sounds to be in her ear.  We also discussed her low with normal blood pressure and that taking some time with changes in position may be helpful from a vertigo symptom standpoint. She does feel she had a sensation in the neck that was different on day that vertigo started. Due to age, new onset vertigo; I do feel that further evaluation with carotid US/echo is reasonable.   3. Palpitations EKG today in sinus rhythm, stable.  No acute changes.  We discussed causes and triggers for palpitations.  Blood work today to make sure no contributing factors for this. - CBC with  Differential/Platelet; Future - Comprehensive metabolic panel; Future - TSH; Future - T4, free; Future - T3, free; Future - EKG 12-Lead - CBC with Differential/Platelet - Comprehensive metabolic panel - TSH - T4, free - T3, free  4. Lipid screening - Lipid panel; Future - Lipid panel  5. History of iron deficiency - Ferritin; Future - Ferritin  Return in about 1 year (around 12/05/2021) for physical exam (and pending bloodwork and imaging).  Micheline Rough, MD

## 2020-12-06 LAB — FERRITIN: Ferritin: 31.2 ng/mL (ref 10.0–291.0)

## 2020-12-06 LAB — COMPREHENSIVE METABOLIC PANEL
ALT: 8 U/L (ref 0–35)
AST: 16 U/L (ref 0–37)
Albumin: 4.4 g/dL (ref 3.5–5.2)
Alkaline Phosphatase: 70 U/L (ref 39–117)
BUN: 13 mg/dL (ref 6–23)
CO2: 26 mEq/L (ref 19–32)
Calcium: 9.3 mg/dL (ref 8.4–10.5)
Chloride: 104 mEq/L (ref 96–112)
Creatinine, Ser: 0.91 mg/dL (ref 0.40–1.20)
GFR: 66.31 mL/min (ref >60.00)
Glucose, Bld: 84 mg/dL (ref 70–99)
Potassium: 3.9 mEq/L (ref 3.5–5.1)
Sodium: 139 mEq/L (ref 135–145)
Total Bilirubin: 0.7 mg/dL (ref 0.2–1.2)
Total Protein: 6.7 g/dL (ref 6.0–8.3)

## 2020-12-06 LAB — CBC WITH DIFFERENTIAL/PLATELET
Basophils Absolute: 0 10*3/uL (ref 0.0–0.1)
Basophils Relative: 0.4 % (ref 0.0–3.0)
Eosinophils Absolute: 0 10*3/uL (ref 0.0–0.7)
Eosinophils Relative: 0.7 % (ref 0.0–5.0)
HCT: 40.9 % (ref 36.0–46.0)
Hemoglobin: 13.7 g/dL (ref 12.0–15.0)
Lymphocytes Relative: 21.4 % (ref 12.0–46.0)
Lymphs Abs: 1.1 10*3/uL (ref 0.7–4.0)
MCHC: 33.5 g/dL (ref 30.0–36.0)
MCV: 96.2 fl (ref 78.0–100.0)
Monocytes Absolute: 0.8 10*3/uL (ref 0.1–1.0)
Monocytes Relative: 15.3 % — ABNORMAL HIGH (ref 3.0–12.0)
Neutro Abs: 3.3 10*3/uL (ref 1.4–7.7)
Neutrophils Relative %: 62.2 % (ref 43.0–77.0)
Platelets: 221 10*3/uL (ref 150.0–400.0)
RBC: 4.25 Mil/uL (ref 3.87–5.11)
RDW: 12.8 % (ref 11.5–15.5)
WBC: 5.3 10*3/uL (ref 4.0–10.5)

## 2020-12-06 LAB — T4, FREE: Free T4: 0.65 ng/dL (ref 0.60–1.60)

## 2020-12-06 LAB — LIPID PANEL
Cholesterol: 215 mg/dL — ABNORMAL HIGH (ref 0–200)
HDL: 55.8 mg/dL (ref >39.00)
LDL Cholesterol: 147 mg/dL — ABNORMAL HIGH (ref 0–99)
NonHDL: 159.39
Total CHOL/HDL Ratio: 4
Triglycerides: 63 mg/dL (ref 0.0–149.0)
VLDL: 12.6 mg/dL (ref 0.0–40.0)

## 2020-12-06 LAB — TSH: TSH: 1.7 u[IU]/mL (ref 0.35–4.50)

## 2020-12-06 LAB — T3, FREE: T3, Free: 2.4 pg/mL (ref 2.3–4.2)

## 2020-12-12 ENCOUNTER — Ambulatory Visit (INDEPENDENT_AMBULATORY_CARE_PROVIDER_SITE_OTHER): Payer: Medicare Other | Admitting: Obstetrics and Gynecology

## 2020-12-12 ENCOUNTER — Encounter: Payer: Self-pay | Admitting: Obstetrics and Gynecology

## 2020-12-12 ENCOUNTER — Other Ambulatory Visit: Payer: Self-pay

## 2020-12-12 VITALS — BP 112/70 | HR 67 | Ht 64.0 in | Wt 136.0 lb

## 2020-12-12 DIAGNOSIS — Z79890 Hormone replacement therapy (postmenopausal): Secondary | ICD-10-CM | POA: Diagnosis not present

## 2020-12-12 DIAGNOSIS — Z01419 Encounter for gynecological examination (general) (routine) without abnormal findings: Secondary | ICD-10-CM

## 2020-12-12 DIAGNOSIS — Z13820 Encounter for screening for osteoporosis: Secondary | ICD-10-CM

## 2020-12-12 DIAGNOSIS — N3946 Mixed incontinence: Secondary | ICD-10-CM | POA: Diagnosis not present

## 2020-12-12 DIAGNOSIS — Z780 Asymptomatic menopausal state: Secondary | ICD-10-CM | POA: Diagnosis not present

## 2020-12-12 LAB — URINALYSIS, COMPLETE W/RFL CULTURE
Bacteria, UA: NONE SEEN /HPF
Bilirubin Urine: NEGATIVE
Glucose, UA: NEGATIVE
Hgb urine dipstick: NEGATIVE
Hyaline Cast: NONE SEEN /LPF
Ketones, ur: NEGATIVE
Leukocyte Esterase: NEGATIVE
Nitrites, Initial: NEGATIVE
Protein, ur: NEGATIVE
RBC / HPF: NONE SEEN /HPF (ref 0–2)
Specific Gravity, Urine: 1.005 (ref 1.001–1.035)
WBC, UA: NONE SEEN /HPF (ref 0–5)
pH: 6 (ref 5.0–8.0)

## 2020-12-12 LAB — NO CULTURE INDICATED

## 2020-12-12 MED ORDER — ESTRADIOL 0.025 MG/24HR TD PTWK: 0.0250 mg | MEDICATED_PATCH | TRANSDERMAL | 3 refills | Status: DC

## 2020-12-12 MED ORDER — MEDROXYPROGESTERONE ACETATE 2.5 MG PO TABS
2.5000 mg | ORAL_TABLET | Freq: Every day | ORAL | 3 refills | Status: DC
Start: 2020-12-12 — End: 2021-12-16

## 2020-12-12 NOTE — Patient Instructions (Addendum)
EXERCISE   We recommended that you start or continue a regular exercise program for good health. Physical activity is anything that gets your body moving, some is better than none. The CDC recommends 150 minutes per week of Moderate-Intensity Aerobic Activity and 2 or more days of Muscle Strengthening Activity.  Benefits of exercise are limitless: helps weight loss/weight maintenance, improves mood and energy, helps with depression and anxiety, improves sleep, tones and strengthens muscles, improves balance, improves bone density, protects from chronic conditions such as heart disease, high blood pressure and diabetes and so much more. To learn more visit: https://www.cdc.gov/physicalactivity/index.html  DIET: Good nutrition starts with a healthy diet of fruits, vegetables, whole grains, and lean protein sources. Drink plenty of water for hydration. Minimize empty calories, sodium, sweets. For more information about dietary recommendations visit: https://health.gov/our-work/nutrition-physical-activity/dietary-guidelines and https://www.myplate.gov/  ALCOHOL:  Women should limit their alcohol intake to no more than 7 drinks/beers/glasses of wine (combined, not each!) per week. Moderation of alcohol intake to this level decreases your risk of breast cancer and liver damage.  If you are concerned that you may have a problem, or your friends have told you they are concerned about your drinking, there are many resources to help. A well-known program that is free, effective, and available to all people all over the nation is Alcoholics Anonymous.  Check out this site to learn more: https://www.aa.org/   CALCIUM AND VITAMIN D:  Adequate intake of calcium and Vitamin D are recommended for bone health.  You should be getting between 1000-1200 mg of calcium and 800 units of Vitamin D daily between diet and supplements  PAP SMEARS:  Pap smears, to check for cervical cancer or precancers,  have traditionally been  done yearly, scientific advances have shown that most women can have pap smears less often.  However, every woman still should have a physical exam from her gynecologist every year. It will include a breast check, inspection of the vulva and vagina to check for abnormal growths or skin changes, a visual exam of the cervix, and then an exam to evaluate the size and shape of the uterus and ovaries. We will also provide age appropriate advice regarding health maintenance, like when you should have certain vaccines, screening for sexually transmitted diseases, bone density testing, colonoscopy, mammograms, etc.   MAMMOGRAMS:  All women over 40 years old should have a routine mammogram.   COLON CANCER SCREENING: Now recommend starting at age 45. At this time colonoscopy is not covered for routine screening until 50. There are take home tests that can be done between 45-49.   COLONOSCOPY:  Colonoscopy to screen for colon cancer is recommended for all women at age 50.  We know, you hate the idea of the prep.  We agree, BUT, having colon cancer and not knowing it is worse!!  Colon cancer so often starts as a polyp that can be seen and removed at colonscopy, which can quite literally save your life!  And if your first colonoscopy is normal and you have no family history of colon cancer, most women don't have to have it again for 10 years.  Once every ten years, you can do something that may end up saving your life, right?  We will be happy to help you get it scheduled when you are ready.  Be sure to check your insurance coverage so you understand how much it will cost.  It may be covered as a preventative service at no cost, but you should check   your particular policy.      Breast Self-Awareness Breast self-awareness means being familiar with how your breasts look and feel. It involves checking your breasts regularly and reporting any changes to your health care provider. Practicing breast self-awareness is  important. A change in your breasts can be a sign of a serious medical problem. Being familiar with how your breasts look and feel allows you to find any problems early, when treatment is more likely to be successful. All women should practice breast self-awareness, including women who have had breast implants. How to do a breast self-exam One way to learn what is normal for your breasts and whether your breasts are changing is to do a breast self-exam. To do a breast self-exam: Look for Changes  1. Remove all the clothing above your waist. 2. Stand in front of a mirror in a room with good lighting. 3. Put your hands on your hips. 4. Push your hands firmly downward. 5. Compare your breasts in the mirror. Look for differences between them (asymmetry), such as: ? Differences in shape. ? Differences in size. ? Puckers, dips, and bumps in one breast and not the other. 6. Look at each breast for changes in your skin, such as: ? Redness. ? Scaly areas. 7. Look for changes in your nipples, such as: ? Discharge. ? Bleeding. ? Dimpling. ? Redness. ? A change in position. Feel for Changes Carefully feel your breasts for lumps and changes. It is best to do this while lying on your back on the floor and again while sitting or standing in the shower or tub with soapy water on your skin. Feel each breast in the following way:  Place the arm on the side of the breast you are examining above your head.  Feel your breast with the other hand.  Start in the nipple area and make  inch (2 cm) overlapping circles to feel your breast. Use the pads of your three middle fingers to do this. Apply light pressure, then medium pressure, then firm pressure. The light pressure will allow you to feel the tissue closest to the skin. The medium pressure will allow you to feel the tissue that is a little deeper. The firm pressure will allow you to feel the tissue close to the ribs.  Continue the overlapping circles,  moving downward over the breast until you feel your ribs below your breast.  Move one finger-width toward the center of the body. Continue to use the  inch (2 cm) overlapping circles to feel your breast as you move slowly up toward your collarbone.  Continue the up and down exam using all three pressures until you reach your armpit.  Write Down What You Find  Write down what is normal for each breast and any changes that you find. Keep a written record with breast changes or normal findings for each breast. By writing this information down, you do not need to depend only on memory for size, tenderness, or location. Write down where you are in your menstrual cycle, if you are still menstruating. If you are having trouble noticing differences in your breasts, do not get discouraged. With time you will become more familiar with the variations in your breasts and more comfortable with the exam. How often should I examine my breasts? Examine your breasts every month. If you are breastfeeding, the best time to examine your breasts is after a feeding or after using a breast pump. If you menstruate, the best time to   examine your breasts is 5-7 days after your period is over. During your period, your breasts are lumpier, and it may be more difficult to notice changes. When should I see my health care provider? See your health care provider if you notice:  A change in shape or size of your breasts or nipples.  A change in the skin of your breast or nipples, such as a reddened or scaly area.  Unusual discharge from your nipples.  A lump or thick area that was not there before.  Pain in your breasts.  Anything that concerns you.   Kegel Exercises  Kegel exercises can help strengthen your pelvic floor muscles. The pelvic floor is a group of muscles that support your rectum, small intestine, and bladder. In females, pelvic floor muscles also help support the womb (uterus). These muscles help you  control the flow of urine and stool. Kegel exercises are painless and simple, and they do not require any equipment. Your provider may suggest Kegel exercises to:  Improve bladder and bowel control.  Improve sexual response.  Improve weak pelvic floor muscles after surgery to remove the uterus (hysterectomy) or pregnancy (females).  Improve weak pelvic floor muscles after prostate gland removal or surgery (males). Kegel exercises involve squeezing your pelvic floor muscles, which are the same muscles you squeeze when you try to stop the flow of urine or keep from passing gas. The exercises can be done while sitting, standing, or lying down, but it is best to vary your position. Exercises How to do Kegel exercises: 1. Squeeze your pelvic floor muscles tight. You should feel a tight lift in your rectal area. If you are a female, you should also feel a tightness in your vaginal area. Keep your stomach, buttocks, and legs relaxed. 2. Hold the muscles tight for up to 10 seconds. 3. Breathe normally. 4. Relax your muscles. 5. Repeat as told by your health care provider. Repeat this exercise daily as told by your health care provider. Continue to do this exercise for at least 4-6 weeks, or for as long as told by your health care provider. You may be referred to a physical therapist who can help you learn more about how to do Kegel exercises. Depending on your condition, your health care provider may recommend:  Varying how long you squeeze your muscles.  Doing several sets of exercises every day.  Doing exercises for several weeks.  Making Kegel exercises a part of your regular exercise routine. This information is not intended to replace advice given to you by your health care provider. Make sure you discuss any questions you have with your health care provider. Document Revised: 11/18/2019 Document Reviewed: 03/03/2018 Elsevier Patient Education  2021 [employer]. Urinary  Incontinence  Urinary incontinence refers to a condition in which a person is unable to control where and when to pass urine. A person with this condition will urinate when he or she does not mean to (involuntarily). What are the causes? This condition may be caused by:  Medicines.  Infections.  Constipation.  Overactive bladder muscles.  Weak bladder muscles.  Weak pelvic floor muscles. These muscles provide support for the bladder, intestine, and, in women, the uterus.  Enlarged prostate in men. The prostate is a gland near the bladder. When it gets too big, it can pinch the urethra. With the urethra blocked, the bladder can weaken and lose the ability to empty properly.  Surgery.  Emotional factors, such as anxiety, stress, or post-traumatic stress   disorder (PTSD).  Pelvic organ prolapse. This happens in women when organs shift out of place and into the vagina. This shift can prevent the bladder and urethra from working properly. What increases the risk? The following factors may make you more likely to develop this condition:  Older age.  Obesity and physical inactivity.  Pregnancy and childbirth.  Menopause.  Diseases that affect the nerves or spinal cord (neurological diseases).  Long-term (chronic) coughing. This can increase pressure on the bladder and pelvic floor muscles. What are the signs or symptoms? Symptoms may vary depending on the type of urinary incontinence you have. They include:  A sudden urge to urinate, but passing urine involuntarily before you can get to a bathroom (urge incontinence).  Suddenly passing urine with any activity that forces urine to pass, such as coughing, laughing, exercise, or sneezing (stress incontinence).  Needing to urinate often, but urinating only a small amount, or constantly dribbling urine (overflow incontinence).  Urinating because you cannot get to the bathroom in time due to a physical disability, such as arthritis  or injury, or communication and thinking problems, such as Alzheimer disease (functional incontinence). How is this diagnosed? This condition may be diagnosed based on:  Your medical history.  A physical exam.  Tests, such as: ? Urine tests. ? X-rays of your kidney and bladder. ? Ultrasound. ? CT scan. ? Cystoscopy. In this procedure, a health care provider inserts a tube with a light and camera (cystoscope) through the urethra and into the bladder in order to check for problems. ? Urodynamic testing. These tests assess how well the bladder, urethra, and sphincter can store and release urine. There are different types of urodynamic tests, and they vary depending on what the test is measuring. To help diagnose your condition, your health care provider may recommend that you keep a log of when you urinate and how much you urinate. How is this treated? Treatment for this condition depends on the type of incontinence that you have and its cause. Treatment may include:  Lifestyle changes, such as: ? Quitting smoking. ? Maintaining a healthy weight. ? Staying active. Try to get 150 minutes of moderate-intensity exercise every week. Ask your health care provider which activities are safe for you. ? Eating a healthy diet.  Avoid high-fat foods, like fried foods.  Avoid refined carbohydrates like white bread and white rice.  Limit how much alcohol and caffeine you drink.  Increase your fiber intake. Foods such as fresh fruits, vegetables, beans, and whole grains are healthy sources of fiber.  Pelvic floor muscle exercises.  Bladder training, such as lengthening the amount of time between bathroom breaks, or using the bathroom at regular intervals.  Using techniques to suppress bladder urges. This can include distraction techniques or controlled breathing exercises.  Medicines to relax the bladder muscles and prevent bladder spasms.  Medicines to help slow or prevent the growth of a  man's prostate.  Botox injections. These can help relax the bladder muscles.  Using pulses of electricity to help change bladder reflexes (electrical nerve stimulation).  For women, using a medical device to prevent urine leaks. This is a small, tampon-like, disposable device that is inserted into the urethra.  Injecting collagen or carbon beads (bulking agents) into the urinary sphincter. These can help thicken tissue and close the bladder opening.  Surgery. Follow these instructions at home: Lifestyle  Limit alcohol and caffeine. These can fill your bladder quickly and irritate it.  Keep yourself clean to help   prevent odors and skin damage. Ask your doctor about special skin creams and cleansers that can protect the skin from urine.  Consider wearing pads or adult diapers. Make sure to change them regularly, and always change them right after experiencing incontinence. General instructions  Take over-the-counter and prescription medicines only as told by your health care provider.  Use the bathroom about every 3-4 hours, even if you do not feel the need to urinate. Try to empty your bladder completely every time. After urinating, wait a minute. Then try to urinate again.  Make sure you are in a relaxed position while urinating.  If your incontinence is caused by nerve problems, keep a log of the medicines you take and the times you go to the bathroom.  Keep all follow-up visits as told by your health care provider. This is important. Contact a health care provider if:  You have pain that gets worse.  Your incontinence gets worse. Get help right away if:  You have a fever or chills.  You are unable to urinate.  You have redness in your groin area or down your legs. Summary  Urinary incontinence refers to a condition in which a person is unable to control where and when to pass urine.  This condition may be caused by medicines, infection, weak bladder muscles, weak  pelvic floor muscles, enlargement of the prostate (in men), or surgery.  The following factors increase your risk for developing this condition: older age, obesity, pregnancy and childbirth, menopause, neurological diseases, and chronic coughing.  There are several types of urinary incontinence. They include urge incontinence, stress incontinence, overflow incontinence, and functional incontinence.  This condition is usually treated first with lifestyle and behavioral changes, such as quitting smoking, eating a healthier diet, and doing regular pelvic floor exercises. Other treatment options include medicines, bulking agents, medical devices, electrical nerve stimulation, or surgery. This information is not intended to replace advice given to you by your health care provider. Make sure you discuss any questions you have with your health care provider. Document Revised: 07/24/2017 Document Reviewed: 10/23/2016 Elsevier Patient Education  2021 Elsevier Inc.  

## 2020-12-13 ENCOUNTER — Ambulatory Visit (HOSPITAL_COMMUNITY)
Admission: RE | Admit: 2020-12-13 | Discharge: 2020-12-13 | Disposition: A | Discharge status: Home / Self Care | Payer: Medicare Other | Source: Ambulatory Visit | Attending: Family Medicine | Admitting: Family Medicine

## 2020-12-13 ENCOUNTER — Other Ambulatory Visit: Payer: Self-pay | Admitting: Family Medicine

## 2020-12-13 DIAGNOSIS — E785 Hyperlipidemia, unspecified: Secondary | ICD-10-CM

## 2020-12-13 DIAGNOSIS — R42 Dizziness and giddiness: Secondary | ICD-10-CM

## 2020-12-13 DIAGNOSIS — Z136 Encounter for screening for cardiovascular disorders: Secondary | ICD-10-CM

## 2020-12-26 ENCOUNTER — Telehealth: Payer: Self-pay | Admitting: Family Medicine

## 2020-12-26 NOTE — Telephone Encounter (Signed)
error 

## 2021-01-01 ENCOUNTER — Telehealth: Payer: Self-pay | Admitting: Family Medicine

## 2021-01-01 ENCOUNTER — Ambulatory Visit (INDEPENDENT_AMBULATORY_CARE_PROVIDER_SITE_OTHER): Payer: Medicare Other

## 2021-01-01 ENCOUNTER — Other Ambulatory Visit: Payer: Self-pay | Admitting: Obstetrics and Gynecology

## 2021-01-01 ENCOUNTER — Other Ambulatory Visit: Payer: Self-pay

## 2021-01-01 DIAGNOSIS — Z13820 Encounter for screening for osteoporosis: Secondary | ICD-10-CM

## 2021-01-01 DIAGNOSIS — Z780 Asymptomatic menopausal state: Secondary | ICD-10-CM

## 2021-01-02 ENCOUNTER — Ambulatory Visit (HOSPITAL_COMMUNITY)
Admission: RE | Admit: 2021-01-02 | Discharge: 2021-01-02 | Disposition: A | Discharge status: Home / Self Care | Payer: Medicare Other | Source: Ambulatory Visit | Attending: Family Medicine | Admitting: Family Medicine

## 2021-01-02 DIAGNOSIS — I358 Other nonrheumatic aortic valve disorders: Secondary | ICD-10-CM | POA: Diagnosis not present

## 2021-01-02 DIAGNOSIS — R002 Palpitations: Secondary | ICD-10-CM | POA: Diagnosis present

## 2021-01-02 DIAGNOSIS — R008 Other abnormalities of heart beat: Secondary | ICD-10-CM | POA: Diagnosis not present

## 2021-01-02 DIAGNOSIS — R42 Dizziness and giddiness: Secondary | ICD-10-CM | POA: Diagnosis present

## 2021-01-02 LAB — ECHOCARDIOGRAM COMPLETE
Area-P 1/2: 2.83 cm2
S' Lateral: 2.8 cm

## 2021-01-02 NOTE — Progress Notes (Signed)
  Echocardiogram 2D Echocardiogram has been performed.  Jennette Dubin 01/02/2021, 3:55 PM

## 2021-01-11 ENCOUNTER — Telehealth: Payer: Self-pay | Admitting: *Deleted

## 2021-01-11 NOTE — Telephone Encounter (Signed)
Patient called requesting  recent dexa results , left detailed message on cell normal dexa results copy will be provided in mail.

## 2021-01-21 ENCOUNTER — Telehealth: Payer: Self-pay | Admitting: Family Medicine

## 2021-01-21 NOTE — Telephone Encounter (Signed)
Patient wants to discuss echo with Dr. Ethlyn Gallery.  Patient does not want to speak to Dr. Berenice Bouton nurse.  She states the later in the afternoon the better, she can't talk to Dr. Elesa Massed in the mornings.

## 2021-01-30 NOTE — Telephone Encounter (Signed)
Spoke with patient. We reviewed echo, carotid studies. We discussed consideration for statin if lifestyle changes do not improve cholesterol. She has lab visit to recheck in October. We discussed ascvd risk scoring.

## 2021-01-31 ENCOUNTER — Telehealth: Payer: Self-pay | Admitting: Family Medicine

## 2021-01-31 ENCOUNTER — Other Ambulatory Visit: Payer: Self-pay

## 2021-01-31 NOTE — Telephone Encounter (Signed)
Spoke with the pt and scheduled an appt with the PCP on 7/8 at 9:30am.

## 2021-01-31 NOTE — Telephone Encounter (Signed)
Pt call first send her apology and then stated she have numbness in her hands and dizziness and want to know where do she go from here.Pt stated she want a call back.

## 2021-02-01 ENCOUNTER — Ambulatory Visit (INDEPENDENT_AMBULATORY_CARE_PROVIDER_SITE_OTHER): Payer: Medicare Other | Admitting: Family Medicine

## 2021-02-01 ENCOUNTER — Encounter: Payer: Self-pay | Admitting: Family Medicine

## 2021-02-01 VITALS — BP 100/70 | HR 66 | Temp 98.4°F | Ht 64.0 in | Wt 131.3 lb

## 2021-02-01 DIAGNOSIS — M542 Cervicalgia: Secondary | ICD-10-CM

## 2021-02-01 DIAGNOSIS — H539 Unspecified visual disturbance: Secondary | ICD-10-CM | POA: Diagnosis not present

## 2021-02-01 DIAGNOSIS — G629 Polyneuropathy, unspecified: Secondary | ICD-10-CM | POA: Diagnosis not present

## 2021-02-01 DIAGNOSIS — R42 Dizziness and giddiness: Secondary | ICD-10-CM

## 2021-02-01 DIAGNOSIS — H81399 Other peripheral vertigo, unspecified ear: Secondary | ICD-10-CM

## 2021-02-01 NOTE — Progress Notes (Signed)
Mariah Gerstenberger Hostetler DOB: 1955/11/10 Encounter date: 02/01/2021  This is a 65 y.o. female who presents with Chief Complaint  Patient presents with   Numbness    Patient complains of bilateral hand numbness x1 month   Dizziness    X3 months    History of present illness:  Not had vertigo since that one episode that we talked about in may. Still some unsteadyness.   Numbness/tingling in hands. R worse than left. Can be standing, can be laying down; doesn't seem to matter. Does feel it in most fingers; worse in thumb and 2,3,4 digits. Does feel like she is a little weaker, esp in right arm. Noted this more in last month. Some right sided neck pain. Didn't really stop to think about it before. Not had neck issue before; but has had more "stress" in neck in the fall. Hasn't been issue since leaving work. Numbness in hand is constant. No pain in arm/hand.   No chest pain.   Sometimes with walking there are times she is just "off" with balance. No falls - near falls. No headaches. No visual changes/disturbances. May be pulled more to the right - has noted more stumble to right on some occasions.   No fullness in ears. Has had infrequent ringing in ears; has been around awhile; not new.   On further thinking did have episode of pixellating in eye - couldn't focus. Sat down and it went away. Thinks it went away in about 30 minutes. Thinks about a month ago. Never had anything similar to this in the past. Has had flashes, floaters in past (age related) but nothing like this.    Allergies  Allergen Reactions   Alpha-Gal    Bactrim [Sulfamethoxazole-Trimethoprim] Rash   Sulfamethoxazole Rash   Current Meds  Medication Sig   aspirin EC 81 MG tablet Take 81 mg by mouth every evening.    calcium citrate-vitamin D (CITRACAL+D) 315-200 MG-UNIT tablet Take 1 tablet by mouth 2 (two) times daily.   cetirizine (ZYRTEC) 10 MG tablet Take by mouth.   Cholecalciferol 25 MCG (1000 UT) capsule Take 2,000  Units by mouth.   citalopram (CELEXA) 20 MG tablet TAKE ONE TABLET BY MOUTH ONE TIME DAILY   estradiol (CLIMARA) 0.025 mg/24hr patch Place 1 patch (0.025 mg total) onto the skin once a week.   Ferrous Sulfate (IRON PO) Take by mouth.   medroxyPROGESTERone (PROVERA) 2.5 MG tablet Take 1 tablet (2.5 mg total) by mouth daily.    Review of Systems  Constitutional:  Negative for chills, fatigue and fever.  Respiratory:  Negative for cough, chest tightness, shortness of breath and wheezing.   Cardiovascular:  Negative for chest pain, palpitations and leg swelling.  Neurological:  Positive for dizziness, weakness, light-headedness and numbness (see hpi). Negative for headaches.   Objective:  BP 100/70 (BP Location: Left Arm, Patient Position: Sitting, Cuff Size: Normal)   Pulse 66   Temp 98.4 F (36.9 C) (Oral)   Ht 5\' 4"  (1.626 m)   Wt 131 lb 4.8 oz (59.6 kg)   LMP 07/28/2008   SpO2 97%   BMI 22.54 kg/m   Weight: 131 lb 4.8 oz (59.6 kg)   BP Readings from Last 3 Encounters:  02/01/21 100/70  12/12/20 112/70  12/05/20 96/60   Wt Readings from Last 3 Encounters:  02/01/21 131 lb 4.8 oz (59.6 kg)  12/12/20 136 lb (61.7 kg)  12/05/20 137 lb 8 oz (62.4 kg)    Physical Exam Constitutional:  General: She is not in acute distress.    Appearance: She is well-developed. She is not diaphoretic.  HENT:     Head: Normocephalic and atraumatic.     Right Ear: External ear normal.     Left Ear: External ear normal.  Eyes:     Conjunctiva/sclera: Conjunctivae normal.     Pupils: Pupils are equal, round, and reactive to light.  Neck:     Thyroid: No thyromegaly.  Cardiovascular:     Rate and Rhythm: Normal rate and regular rhythm.     Heart sounds: Normal heart sounds. No murmur heard.   No friction rub. No gallop.  Pulmonary:     Effort: Pulmonary effort is normal. No respiratory distress.     Breath sounds: Normal breath sounds. No wheezing or rales.  Musculoskeletal:      Cervical back: Neck supple.     Comments: Mild tenderness right posterior neck; slight limitation with left head turn; no other limitation. No spinal tenderness.   Lymphadenopathy:     Cervical: No cervical adenopathy.  Skin:    General: Skin is warm and dry.  Neurological:     Mental Status: She is alert and oriented to person, place, and time.     Cranial Nerves: No cranial nerve deficit.     Motor: No abnormal muscle tone or pronator drift.     Coordination: Heel to [facility] Test abnormal.     Gait: Tandem walk abnormal.     Deep Tendon Reflexes: Reflexes normal.     Reflex Scores:      Tricep reflexes are 2+ on the right side and 2+ on the left side.      Bicep reflexes are 2+ on the right side and 2+ on the left side.      Brachioradialis reflexes are 2+ on the right side and 2+ on the left side.      Patellar reflexes are 2+ on the right side and 2+ on the left side.    Comments: Difficulty with heel to shin with balancing on left foot; pulls to right with walking heel to toe  Psychiatric:        Behavior: Behavior normal.     Assessment/Plan 1. Neuropathy Concerned with episode of vertigo, persistent light headedness, and now new neuropathy.  - MR Brain Wo Contrast; Future  2. Dizziness Consider neuro referral pending imaging - MR Brain Wo Contrast; Future  3. Visual disturbance - MR Brain Wo Contrast; Future  4. Neck pain Minor pain; but with neuropathy hands would like to make sure xray normal.  - DG Cervical Spine Complete; Future  5. Other peripheral vertigo, unspecified ear - MR Brain Wo Contrast; Future  Follow up pending imaging    Micheline Rough, MD

## 2021-02-05 ENCOUNTER — Ambulatory Visit (INDEPENDENT_AMBULATORY_CARE_PROVIDER_SITE_OTHER)
Admission: RE | Admit: 2021-02-05 | Discharge: 2021-02-05 | Disposition: A | Discharge status: Home / Self Care | Payer: Medicare Other | Source: Ambulatory Visit | Attending: Family Medicine | Admitting: Family Medicine

## 2021-02-05 ENCOUNTER — Other Ambulatory Visit: Payer: Self-pay

## 2021-02-05 DIAGNOSIS — M542 Cervicalgia: Secondary | ICD-10-CM | POA: Diagnosis not present

## 2021-02-07 ENCOUNTER — Other Ambulatory Visit: Payer: Self-pay | Admitting: Obstetrics and Gynecology

## 2021-02-07 NOTE — Telephone Encounter (Signed)
Dr. Talbert Nan prescribed at 12/07/19 AEX for the year but it was not prescribed at [facility] 12/12/2020.

## 2021-02-21 ENCOUNTER — Ambulatory Visit
Admission: RE | Admit: 2021-02-21 | Discharge: 2021-02-21 | Disposition: A | Discharge status: Home / Self Care | Payer: Medicare Other | Source: Ambulatory Visit | Attending: Family Medicine | Admitting: Family Medicine

## 2021-02-21 ENCOUNTER — Other Ambulatory Visit: Payer: Self-pay

## 2021-02-21 DIAGNOSIS — H539 Unspecified visual disturbance: Secondary | ICD-10-CM

## 2021-02-21 DIAGNOSIS — G629 Polyneuropathy, unspecified: Secondary | ICD-10-CM

## 2021-02-21 DIAGNOSIS — H81399 Other peripheral vertigo, unspecified ear: Secondary | ICD-10-CM

## 2021-02-21 DIAGNOSIS — R42 Dizziness and giddiness: Secondary | ICD-10-CM

## 2021-02-22 ENCOUNTER — Telehealth: Payer: Self-pay | Admitting: Family Medicine

## 2021-02-22 ENCOUNTER — Telehealth: Payer: Self-pay | Admitting: *Deleted

## 2021-02-22 ENCOUNTER — Other Ambulatory Visit: Payer: Self-pay | Admitting: Family Medicine

## 2021-02-22 DIAGNOSIS — G629 Polyneuropathy, unspecified: Secondary | ICD-10-CM

## 2021-02-22 DIAGNOSIS — M542 Cervicalgia: Secondary | ICD-10-CM

## 2021-02-22 DIAGNOSIS — H0589 Other disorders of orbit: Secondary | ICD-10-CM

## 2021-02-22 NOTE — Telephone Encounter (Signed)
[facility] can be reached at [phone].  Message sent to PCP.

## 2021-02-22 NOTE — Telephone Encounter (Signed)
I called patient to review results of MRI brain (we also reviewed cervical xray which I had not previously reviewed).   She has ophthalmologist, Dr. Katy Apo. I did leave a message on his patient voicemail regarding MRI findings.  *encouraged her to set up follow up with him next week *discussed MRI ordered for further evaluation with patient *faxed over my office note, MRI result and cover letter to Dr. Prudencio Burly

## 2021-02-22 NOTE — Telephone Encounter (Signed)
Opal Sidles called from [facility] with a call report on the MRI brain study as below: -mass in retrobulbar right orbit; recommend ophthalmology consultation and MRI of orbits with contrast to further evaluate -focal area of artifact in pons, may represent small vascular malformation OR sequela of prior hemorrhage -please request that whole brain, post contrast imaging be performed on recommended orbit MRI to further evaluate the area  Message forwarded to PCP.

## 2021-02-22 NOTE — Telephone Encounter (Signed)
I tried to call patient but she didn't answer. I did leave message to call back and stated I would send her a message in [place], but she is not set up in mychart. If she calls back, ok to grab me from a room to review results with her or confirm call back number/time.   *per radiology suggestions I am going to put in orders for additional MRI with contrast to look at an area behind her eye. I will also put in ophtholmology referral. If she has an eye doc please get name and I can get results to them as well.

## 2021-02-26 ENCOUNTER — Telehealth: Payer: Self-pay | Admitting: Family Medicine

## 2021-02-26 NOTE — Telephone Encounter (Signed)
PT called to advise she would like a update on the status of her referral for a MRI and Opthalmology as she has no heard anything yet.

## 2021-02-26 NOTE — Telephone Encounter (Signed)
Per Neoma Laming, I spoke with the pt and informed her [place] is awaiting a call to go over screening questions in order to schedule the MRI.  I offered to give the pt the number to contact [place] at [phone] opt 1 and she stated she is not able to write down a number at this time.  Also informed the pt Neoma Laming stated the referral to Dr Zenia Resides office is still in review and she will be contacted once completed.

## 2021-02-28 ENCOUNTER — Telehealth: Payer: Self-pay | Admitting: Family Medicine

## 2021-02-28 NOTE — Telephone Encounter (Signed)
Patient called asking if someone could give her the results of her MRI.  Informed patient that Dr. Ethlyn Gallery and her medical assistant were out of the office today but will be back tomorrow.   Patient stated that she wanted to talk to someone about results.  Told patient that the only medical assistant that was free did not see results in patient's chart and that I would have to send a message back.  Patient is requesting a call back.  Please advise.

## 2021-03-01 ENCOUNTER — Other Ambulatory Visit: Payer: Self-pay

## 2021-03-01 ENCOUNTER — Ambulatory Visit
Admission: RE | Admit: 2021-03-01 | Discharge: 2021-03-01 | Disposition: A | Discharge status: Home / Self Care | Payer: Medicare Other | Source: Ambulatory Visit | Attending: Family Medicine | Admitting: Family Medicine

## 2021-03-01 DIAGNOSIS — H0589 Other disorders of orbit: Secondary | ICD-10-CM

## 2021-03-01 MED ORDER — GADOBENATE DIMEGLUMINE 529 MG/ML IV SOLN
10.0000 mL | Freq: Once | INTRAVENOUS | Status: AC | PRN
  Administered 2021-03-01: 10 mL via INTRAVENOUS

## 2021-03-01 NOTE — Telephone Encounter (Signed)
Noted  

## 2021-03-01 NOTE — Telephone Encounter (Signed)
Spoke with patient and she has spoke with Dr Prudencio Burly who explained her results. Nothing further needed at this time.

## 2021-03-01 NOTE — Telephone Encounter (Signed)
I reviewed results with her from 7/29 MRI earlier this week; she has another imaging appointment this afternoon and another set up for Monday. Not sure if she is wanting to review what we already spoke of? Or get report of imaging? Please see if you can get more details. I don't have any additional information from the last time that we spoke.   Please ask if she has been able to set up visit with her eye doc, Dr. Prudencio Burly? Thanks!

## 2021-03-04 ENCOUNTER — Ambulatory Visit
Admission: RE | Admit: 2021-03-04 | Discharge: 2021-03-04 | Disposition: A | Discharge status: Home / Self Care | Payer: Medicare Other | Source: Ambulatory Visit | Attending: Family Medicine | Admitting: Family Medicine

## 2021-03-04 ENCOUNTER — Other Ambulatory Visit: Payer: Self-pay

## 2021-03-04 DIAGNOSIS — M542 Cervicalgia: Secondary | ICD-10-CM

## 2021-03-04 DIAGNOSIS — G629 Polyneuropathy, unspecified: Secondary | ICD-10-CM

## 2021-03-05 ENCOUNTER — Telehealth: Payer: Self-pay | Admitting: *Deleted

## 2021-03-05 NOTE — Telephone Encounter (Signed)
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

## 2021-03-06 NOTE — Telephone Encounter (Signed)
Patient informed-see results note. 

## 2021-03-06 NOTE — Telephone Encounter (Signed)
Results note sent on this.

## 2021-03-08 ENCOUNTER — Other Ambulatory Visit: Payer: Medicare Other

## 2021-03-11 ENCOUNTER — Other Ambulatory Visit: Payer: Self-pay | Admitting: Family Medicine

## 2021-03-11 DIAGNOSIS — M542 Cervicalgia: Secondary | ICD-10-CM

## 2021-03-11 DIAGNOSIS — G629 Polyneuropathy, unspecified: Secondary | ICD-10-CM

## 2021-03-18 ENCOUNTER — Telehealth: Payer: Self-pay

## 2021-03-18 NOTE — Telephone Encounter (Signed)
Patient called checking the status of a referral for a neurosurgeon

## 2021-04-10 ENCOUNTER — Ambulatory Visit: Payer: Medicare Other | Attending: Neurological Surgery | Admitting: Physical Therapy

## 2021-04-10 ENCOUNTER — Encounter: Payer: Self-pay | Admitting: Physical Therapy

## 2021-04-10 ENCOUNTER — Other Ambulatory Visit: Payer: Self-pay

## 2021-04-10 DIAGNOSIS — M6281 Muscle weakness (generalized): Secondary | ICD-10-CM | POA: Insufficient documentation

## 2021-04-10 NOTE — Therapy (Signed)
[facility] [address], [place], [place], [address] Phone: [phone]   Fax:  [phone]  Physical Therapy Evaluation  Patient Details  Name: Kendra Hoffman MRN: [id number] Date of Birth: [birth year] Referring Provider (PT): Judith Part, MD   Encounter Date: 04/10/2021   PT End of Session - 04/10/21 1334     Visit Number 1    Authorization Type BCBS    PT Start Time 1148    PT Stop Time 1250    PT Time Calculation (min) 62 min    Activity Tolerance Patient tolerated treatment well    Behavior During Therapy [facility] for tasks assessed/performed             Past Medical History:  Diagnosis Date   Amenorrhea    Anemia    history of at age 42   Arthritis    bilateral hands   Breast nodule 09/2006   no mass, patient denies being told about this issue 09/18/2016   Cancer ([place])    melanoma   Depression    Hx of melanoma of skin 2011   left foot    Mood disorder (H[facility])    PONV (postoperative nausea and vomiting)     Past Surgical History:  Procedure Laterality Date   BUNIONECTOMY     right   COLONOSCOPY     DILATATION & CURETTAGE/HYSTEROSCOPY WITH MYOSURE N/A 09/22/2016   Procedure: DILATATION & CURETTAGE/HYSTEROSCOPY WITH MYOSURE (possible Myosure);  Surgeon: Salvadore Dom, MD;  Location: [facility];  Service: Gynecology;  Laterality: N/A;  follow first case   LAPAROSCOPIC BILATERAL SALPINGO OOPHERECTOMY Bilateral 09/04/2015   Procedure: LAPAROSCOPIC BILATERAL SALPINGO OOPHORECTOMY with pelvic washings;  Surgeon: Salvadore Dom, MD;  Location: [facility];  Service: Gynecology;  Laterality: Bilateral;   MELANOMA EXCISION  2012   left foot    OVARIAN CYST REMOVAL      There were no vitals filed for this visit.    Subjective Assessment - 04/10/21 1148     Subjective Pt is a Lt handed female referred to PT at Pt's request.  5 mos ago Pt started having tingling and numbness which has  progressed to strength and coordination problems, especially with fine motor tasks in Rt hand.  Has had some vertigo and balance problems with walking straight or with change of directions.  Her Rt knee has buckled in standing.    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 Yes    Pain Score 3     Pain Location Neck    Pain Orientation Right    Pain Descriptors / Indicators Tightness    Pain Type Acute pain    Pain Onset More than a month ago    Pain Frequency Constant    Aggravating Factors  shampooing hair    Pain Relieving Factors maybe laying on back with arms out    Effect of Pain on Daily Activities weakness with Rt fine motor, balance is off                [facility] PT Assessment - 04/10/21 0001       Assessment   Medical Diagnosis  G95.9 (ICD-10-CM) - Disease of spinal cord, unspecified    Referring Provider (PT) Judith Part, MD    Onset Date/Surgical Date --   5 mos ago   Hand Dominance Left    Next MD Visit not scheduled yet, maybe surgery    Prior Therapy no      Posture/Postural Control   Posture/Postural Control Postural limitations    Postural Limitations Decreased thoracic kyphosis    Posture Comments reduced cervical lordosis      ROM / Strength   AROM / PROM / Strength AROM;Strength      AROM   AROM Assessment Site Cervical    Cervical Flexion 53    Cervical Extension 70    Cervical - Right Side Bend 35    Cervical - Left Side Bend 40    Cervical - Right Rotation 70    Cervical - Left Rotation 60      Strength   Overall Strength Comments Rt and Lt UE 5/5 with exception of bil shoulder ER 4/5, Rt shoulder abduction fatigues after 5  trials of MMT, LEs 5/5 bil    Strength Assessment Site Hand    Right/Left hand Right;Left    Right Hand Grip (lbs) 50   reduced to 35lb by 5th trial   Right Hand Lateral Pinch 19 lbs    Left Hand Grip (lbs) 50    Left Hand Lateral Pinch 17 lbs      Palpation   Spinal mobility limited PAs T1-T3, limited facet mobility mid and lower cervical spine bil    Palpation comment Rt upper trap tender to palpation, TP present      Special Tests    Special Tests Cervical    Other special tests end range ULTT + for median and ulnar nerve on Rt, negative for radial, UMN tests negative for wrist and ankle clonus, DTRs 3+/4 patellar and tricep    Cervical Tests Spurling's      Spurling's   Findings Positive    Side Right    Comment increased Rt UE numbness with cardinal plane extension and SB      Ambulation/Gait   Ambulation/Gait Yes    Gait Pattern Within Functional Limits    Gait Comments able to pivot turn, brisk ambulation without LOB                        Objective measurements completed on examination: See above findings.       [facility] Adult PT Treatment/Exercise - 04/10/21 0001       Self-Care   Self-Care Other Self-Care Comments    Other Self-Care Comments  discussion of clinical findings, correlation with cerival MRI, option for PT interventions                       PT Short Term Goals - 04/10/21 1343       PT SHORT TERM GOAL #1   Title Pt will understand the anatomy and symptoms related to her cervical spine and what to watch for for red flags.    Time 1    Period Weeks    Status Achieved                       Plan - 04/10/21 1335     Clinical Impression Statement Pt is a Lt hand dominant female referred to OPPT with ongoing Rt > Lt numbness, weakness with grip and fine  motor tasks, and balance and gait instability.  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  Pt was interested in learning more about her clinical presentation before she decided to move forward with surgery.  She presents with limited end range cervical ROM but ROM is WFL.  She does report increased Rt UE symptoms almost immediately with closing patterns of cervical spine. She has 5/5 strength in bil UEs and LEs; however her Rt UE did fatigue with repeated testing.  Rt gross grip regressed from 50lb to 35lb by 5th trial whereas Lt UE remained consistently 50lb all 5 trials.  She reports her Rt knee can buckle and she has lost her balance several times, mostly with turning/changing directions, and feels her coordination is at times off.  Symptom onset was 5 mos ago and has been progressive.  Pt does have 3+/4 reflexes for both UE/LE.  She is negative for clonus at wrists and ankles.  Hoffman is negative bil.  Pt and PT discussed at length her clinical presentation and her MRI findings.  PT used spinal model at Pt's request to further her understanding of her MRI and correlated it with her symptoms.  PT gave option to do PT but Pt felt she will likely move forward with surgery and perhaps do PT after surgery.  No POC needed at this time.    Personal Factors and Comorbidities Time since onset of injury/illness/exacerbation    Examination-Activity Limitations Locomotion Level;Other   fine motor tasks with Rt UE   Stability/Clinical Decision Making Evolving/Moderate complexity    Clinical Decision Making Moderate    Rehab Potential Fair    PT Frequency Other (comment)   eval only   PT Duration Other (comment)   eval only   PT Treatment/Interventions Other (comment)   eval only   PT Next Visit Plan Pt decided not to pursue PT at this time, plans to have surgery    Consulted and Agree with Plan of Care Patient             Patient will benefit from skilled therapeutic intervention in order to improve the following deficits and impairments:  Decreased range of motion, Impaired sensation,  Decreased strength, Impaired flexibility  Visit Diagnosis: Muscle weakness (generalized) - Plan: PT plan of care cert/re-cert     Problem List Patient Active Problem List   Diagnosis Date Noted   Hx of melanoma of skin 08/29/2016   Medication management 02/07/2013   Visit for preventive health examination 11/18/2010   PERIMENOPAUSAL SYNDROME 12/21/2009   HAND PAIN, BILATERAL 12/21/2009   HSV 10/13/2007   Insomnia 10/13/2007   BACK PAIN 04/26/2007   Baruch Merl, PT 04/10/21 1:46 PM  [facility] [address], [place], [place], [address] Phone: [phone]   Fax:  [phone]  Name: Kendra Hoffman MRN: [id number] Date of Birth: 07/05/1956

## 2021-04-29 ENCOUNTER — Other Ambulatory Visit (INDEPENDENT_AMBULATORY_CARE_PROVIDER_SITE_OTHER): Payer: Medicare Other

## 2021-04-29 ENCOUNTER — Other Ambulatory Visit: Payer: Self-pay

## 2021-04-29 ENCOUNTER — Other Ambulatory Visit: Payer: Self-pay | Admitting: *Deleted

## 2021-04-29 DIAGNOSIS — E785 Hyperlipidemia, unspecified: Secondary | ICD-10-CM | POA: Diagnosis not present

## 2021-04-30 LAB — NMR, LIPOPROFILE
Cholesterol, Total: 215 mg/dL — ABNORMAL HIGH (ref 100–199)
HDL Particle Number: 37 umol/L (ref >=30.5)
HDL-C: 68 mg/dL (ref >39)
LDL Particle Number: 1218 nmol/L — ABNORMAL HIGH (ref <1000)
LDL Size: 21.7 nm (ref >20.5)
LDL-C (NIH Calc): 134 mg/dL — ABNORMAL HIGH (ref 0–99)
LP-IR Score: 25 (ref <=45)
Small LDL Particle Number: 192 nmol/L (ref <=527)
Triglycerides: 76 mg/dL (ref 0–149)

## 2021-07-30 ENCOUNTER — Encounter: Payer: Self-pay | Admitting: Obstetrics and Gynecology

## 2021-09-12 ENCOUNTER — Telehealth: Payer: Self-pay | Admitting: Family Medicine

## 2021-09-12 NOTE — Telephone Encounter (Signed)
Left message for patient to call back and schedule Medicare Annual Wellness Visit (AWV) either virtually or in office. Left  my Herbie Drape number [phone]   awvi 07/28/21 per [place]  please schedule at anytime with [facility] Nurse Health Advisor 1 or 2   This should be a 45 minute visit.

## 2021-09-23 ENCOUNTER — Ambulatory Visit: Payer: Medicare Other

## 2021-10-09 ENCOUNTER — Ambulatory Visit: Payer: Medicare Other

## 2021-10-12 ENCOUNTER — Other Ambulatory Visit: Payer: Self-pay

## 2021-10-12 ENCOUNTER — Emergency Department (HOSPITAL_BASED_OUTPATIENT_CLINIC_OR_DEPARTMENT_OTHER): Payer: Medicare Other

## 2021-10-12 ENCOUNTER — Emergency Department (HOSPITAL_BASED_OUTPATIENT_CLINIC_OR_DEPARTMENT_OTHER)
Admission: EM | Admit: 2021-10-12 | Discharge: 2021-10-12 | Disposition: A | Discharge status: Home / Self Care | Payer: Medicare Other | Attending: Student | Admitting: Student

## 2021-10-12 ENCOUNTER — Encounter (HOSPITAL_BASED_OUTPATIENT_CLINIC_OR_DEPARTMENT_OTHER): Payer: Self-pay | Admitting: Emergency Medicine

## 2021-10-12 DIAGNOSIS — Z7982 Long term (current) use of aspirin: Secondary | ICD-10-CM | POA: Insufficient documentation

## 2021-10-12 DIAGNOSIS — R413 Other amnesia: Secondary | ICD-10-CM

## 2021-10-12 DIAGNOSIS — Z85828 Personal history of other malignant neoplasm of skin: Secondary | ICD-10-CM | POA: Insufficient documentation

## 2021-10-12 DIAGNOSIS — R55 Syncope and collapse: Secondary | ICD-10-CM | POA: Diagnosis present

## 2021-10-12 DIAGNOSIS — R4182 Altered mental status, unspecified: Secondary | ICD-10-CM | POA: Diagnosis present

## 2021-10-12 LAB — CBC WITH DIFFERENTIAL/PLATELET
Abs Immature Granulocytes: 0.01 10*3/uL (ref 0.00–0.07)
Basophils Absolute: 0 10*3/uL (ref 0.0–0.1)
Basophils Relative: 0 %
Eosinophils Absolute: 0.1 10*3/uL (ref 0.0–0.5)
Eosinophils Relative: 1 %
HCT: 42.2 % (ref 36.0–46.0)
Hemoglobin: 14.7 g/dL (ref 12.0–15.0)
Immature Granulocytes: 0 %
Lymphocytes Relative: 25 %
Lymphs Abs: 1.5 10*3/uL (ref 0.7–4.0)
MCH: 32.7 pg (ref 26.0–34.0)
MCHC: 34.8 g/dL (ref 30.0–36.0)
MCV: 93.8 fL (ref 80.0–100.0)
Monocytes Absolute: 0.5 10*3/uL (ref 0.1–1.0)
Monocytes Relative: 9 %
Neutro Abs: 3.9 10*3/uL (ref 1.7–7.7)
Neutrophils Relative %: 65 %
Platelets: 236 10*3/uL (ref 150–400)
RBC: 4.5 MIL/uL (ref 3.87–5.11)
RDW: 12.9 % (ref 11.5–15.5)
WBC: 5.9 10*3/uL (ref 4.0–10.5)
nRBC: 0 % (ref 0.0–0.2)

## 2021-10-12 LAB — COMPREHENSIVE METABOLIC PANEL
ALT: 13 U/L (ref 0–44)
AST: 24 U/L (ref 15–41)
Albumin: 4.7 g/dL (ref 3.5–5.0)
Alkaline Phosphatase: 73 U/L (ref 38–126)
Anion gap: 11 (ref 5–15)
BUN: 14 mg/dL (ref 8–23)
CO2: 23 mmol/L (ref 22–32)
Calcium: 9.6 mg/dL (ref 8.9–10.3)
Chloride: 102 mmol/L (ref 98–111)
Creatinine, Ser: 1.09 mg/dL — ABNORMAL HIGH (ref 0.44–1.00)
GFR, Estimated: 56 mL/min — ABNORMAL LOW (ref >60)
Glucose, Bld: 95 mg/dL (ref 70–99)
Potassium: 3.6 mmol/L (ref 3.5–5.1)
Sodium: 136 mmol/L (ref 135–145)
Total Bilirubin: 0.8 mg/dL (ref 0.3–1.2)
Total Protein: 7.7 g/dL (ref 6.5–8.1)

## 2021-10-12 LAB — URINALYSIS, ROUTINE W REFLEX MICROSCOPIC
Bilirubin Urine: NEGATIVE
Glucose, UA: NEGATIVE mg/dL
Hgb urine dipstick: NEGATIVE
Ketones, ur: 40 mg/dL — AB
Leukocytes,Ua: NEGATIVE
Nitrite: NEGATIVE
Protein, ur: NEGATIVE mg/dL
Specific Gravity, Urine: 1.015 (ref 1.005–1.030)
pH: 6 (ref 5.0–8.0)

## 2021-10-12 LAB — CBG MONITORING, ED: Glucose-Capillary: 93 mg/dL (ref 70–99)

## 2021-10-12 MED ORDER — IOHEXOL 350 MG/ML SOLN
100.0000 mL | Freq: Once | INTRAVENOUS | Status: AC | PRN
  Administered 2021-10-12: 100 mL via INTRAVENOUS

## 2021-10-12 NOTE — ED Notes (Signed)
ED Provider at bedside. 

## 2021-10-12 NOTE — ED Notes (Signed)
With CT 

## 2021-10-12 NOTE — ED Notes (Signed)
Patient was able to ambulate to the bathroom for urine sample. Nephew is at bedside. ?

## 2021-10-12 NOTE — ED Notes (Signed)
Back from CT

## 2021-10-12 NOTE — ED Notes (Signed)
To CT
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
## 2021-10-12 NOTE — ED Notes (Signed)
Patient given water to drink. Patient still feels like she is having trouble recalling recent events.  ?
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
## 2021-10-23 ENCOUNTER — Encounter: Payer: Self-pay | Admitting: Neurology

## 2021-10-23 ENCOUNTER — Ambulatory Visit: Payer: Medicare Other | Admitting: Neurology

## 2021-10-23 VITALS — Ht 64.0 in | Wt 129.0 lb

## 2021-10-23 DIAGNOSIS — G454 Transient global amnesia: Secondary | ICD-10-CM

## 2021-10-23 DIAGNOSIS — F4321 Adjustment disorder with depressed mood: Secondary | ICD-10-CM

## 2021-10-23 NOTE — Patient Instructions (Signed)
Follow up with your PMD  ?Return if worse  ?
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
## 2021-10-24 ENCOUNTER — Ambulatory Visit (INDEPENDENT_AMBULATORY_CARE_PROVIDER_SITE_OTHER): Payer: Medicare Other

## 2021-10-24 VITALS — BP 110/62 | HR 70 | Temp 98.1°F | Ht 64.0 in | Wt 130.6 lb

## 2021-10-24 DIAGNOSIS — Z0000 Encounter for general adult medical examination without abnormal findings: Secondary | ICD-10-CM

## 2021-10-24 NOTE — Patient Instructions (Addendum)
?Kendra Hoffman , ?Thank you for taking time to come for your Medicare Wellness Visit. I appreciate your ongoing commitment to your health goals. Please review the following plan we discussed and let me know if I can assist you in the future.  ? ?These are the goals we discussed: ? Goals   ? ?   Increase physical activity (pt-stated)   ?   I would like to take time for myself, also increase exercise. ?  ? ?  ?  ?This is a list of the screening recommended for you and due dates:  ?Health Maintenance  ?Topic Date Due  ? Flu Shot  10/25/2021*  ? COVID-19 Vaccine (3 - Pfizer risk series) 11/09/2021*  ? Hepatitis C Screening: USPSTF Recommendation to screen - Ages 18-79 yo.  12/05/2021*  ? Zoster (Shingles) Vaccine (1 of 2) 01/24/2022*  ? Pneumonia Vaccine (1 - PCV) 10/25/2022*  ? Tetanus Vaccine  10/25/2022*  ? Colon Cancer Screening  01/18/2022  ? Mammogram  07/31/2023  ? DEXA scan (bone density measurement)  Completed  ? HPV Vaccine  Aged Out  ?*Topic was postponed. The date shown is not the original due date.  ?  ?Advanced directives: Yes ? ?Conditions/risks identified: None ? ?Next appointment: Follow up in one year for your annual wellness visit  ? ? ?Preventive Care 31 Years and Older, Female ?Preventive care refers to lifestyle choices and visits with your health care provider that can promote health and wellness. ?What does preventive care include? ?A yearly physical exam. This is also called an annual well check. ?Dental exams once or twice a year. ?Routine eye exams. Ask your health care provider how often you should have your eyes checked. ?Personal lifestyle choices, including: ?Daily care of your teeth and gums. ?Regular physical activity. ?Eating a healthy diet. ?Avoiding tobacco and drug use. ?Limiting alcohol use. ?Practicing safe sex. ?Taking low-dose aspirin every day. ?Taking vitamin and mineral supplements as recommended by your health care provider. ?What happens during an annual well check? ?The  services and screenings done by your health care provider during your annual well check will depend on your age, overall health, lifestyle risk factors, and family history of disease. ?Counseling  ?Your health care provider may ask you questions about your: ?Alcohol use. ?Tobacco use. ?Drug use. ?Emotional well-being. ?Home and relationship well-being. ?Sexual activity. ?Eating habits. ?History of falls. ?Memory and ability to understand (cognition). ?Work and work Statistician. ?Reproductive health. ?Screening  ?You may have the following tests or measurements: ?Height, weight, and BMI. ?Blood pressure. ?Lipid and cholesterol levels. These may be checked every 5 years, or more frequently if you are over 80 years old. ?Skin check. ?Lung cancer screening. You may have this screening every year starting at age 16 if you have a 30-pack-year history of smoking and currently smoke or have quit within the past 15 years. ?Fecal occult blood test (FOBT) of the stool. You may have this test every year starting at age 45. ?Flexible sigmoidoscopy or colonoscopy. You may have a sigmoidoscopy every 5 years or a colonoscopy every 10 years starting at age 83. ?Hepatitis C blood test. ?Hepatitis B blood test. ?Sexually transmitted disease (STD) testing. ?Diabetes screening. This is done by checking your blood sugar (glucose) after you have not eaten for a while (fasting). You may have this done every 1-3 years. ?Bone density scan. This is done to screen for osteoporosis. You may have this done starting at age 49. ?Mammogram. This may be done every 1-2  years. Talk to your health care provider about how often you should have regular mammograms. ?Talk with your health care provider about your test results, treatment options, and if necessary, the need for more tests. ?Vaccines  ?Your health care provider may recommend certain vaccines, such as: ?Influenza vaccine. This is recommended every year. ?Tetanus, diphtheria, and acellular  pertussis (Tdap, Td) vaccine. You may need a Td booster every 10 years. ?Zoster vaccine. You may need this after age 32. ?Pneumococcal 13-valent conjugate (PCV13) vaccine. One dose is recommended after age 74. ?Pneumococcal polysaccharide (PPSV23) vaccine. One dose is recommended after age 61. ?Talk to your health care provider about which screenings and vaccines you need and how often you need them. ?This information is not intended to replace advice given to you by your health care provider. Make sure you discuss any questions you have with your health care provider. ?Document Released: 08/10/2015 Document Revised: 04/02/2016 Document Reviewed: 05/15/2015 ?Elsevier Interactive Patient Education ? 2017 [place]. ? ?Fall Prevention in the Home ?Falls can cause injuries. They can happen to people of all ages. There are many things you can do to make your home safe and to help prevent falls. ?What can I do on the outside of my home? ?Regularly fix the edges of walkways and driveways and fix any cracks. ?Remove anything that might make you trip as you walk through a door, such as a raised step or threshold. ?Trim any bushes or trees on the path to your home. ?Use bright outdoor lighting. ?Clear any walking paths of anything that might make someone trip, such as rocks or tools. ?Regularly check to see if handrails are loose or broken. Make sure that both sides of any steps have handrails. ?Any raised decks and porches should have guardrails on the edges. ?Have any leaves, snow, or ice cleared regularly. ?Use sand or salt on walking paths during winter. ?Clean up any spills in your garage right away. This includes oil or grease spills. ?What can I do in the bathroom? ?Use night lights. ?Install grab bars by the toilet and in the tub and shower. Do not use towel bars as grab bars. ?Use non-skid mats or decals in the tub or shower. ?If you need to sit down in the shower, use a plastic, non-slip stool. ?Keep the floor  dry. Clean up any water that spills on the floor as soon as it happens. ?Remove soap buildup in the tub or shower regularly. ?Attach bath mats securely with double-sided non-slip rug tape. ?Do not have throw rugs and other things on the floor that can make you trip. ?What can I do in the bedroom? ?Use night lights. ?Make sure that you have a light by your bed that is easy to reach. ?Do not use any sheets or blankets that are too big for your bed. They should not hang down onto the floor. ?Have a firm chair that has side arms. You can use this for support while you get dressed. ?Do not have throw rugs and other things on the floor that can make you trip. ?What can I do in the kitchen? ?Clean up any spills right away. ?Avoid walking on wet floors. ?Keep items that you use a lot in easy-to-reach places. ?If you need to reach something above you, use a strong step stool that has a grab bar. ?Keep electrical cords out of the way. ?Do not use floor polish or wax that makes floors slippery. If you must use wax, use non-skid floor  wax. ?Do not have throw rugs and other things on the floor that can make you trip. ?What can I do with my stairs? ?Do not leave any items on the stairs. ?Make sure that there are handrails on both sides of the stairs and use them. Fix handrails that are broken or loose. Make sure that handrails are as long as the stairways. ?Check any carpeting to make sure that it is firmly attached to the stairs. Fix any carpet that is loose or worn. ?Avoid having throw rugs at the top or bottom of the stairs. If you do have throw rugs, attach them to the floor with carpet tape. ?Make sure that you have a light switch at the top of the stairs and the bottom of the stairs. If you do not have them, ask someone to add them for you. ?What else can I do to help prevent falls? ?Wear shoes that: ?Do not have high heels. ?Have rubber bottoms. ?Are comfortable and fit you well. ?Are closed at the toe. Do not wear  sandals. ?If you use a stepladder: ?Make sure that it is fully opened. Do not climb a closed stepladder. ?Make sure that both sides of the stepladder are locked into place. ?Ask someone to hold it for you, if possibl

## 2021-10-24 NOTE — Progress Notes (Signed)
? ?Subjective:  ? Kendra Hoffman is a 66 y.o. female who presents for Medicare Annual (Subsequent) preventive examination. ? ?Review of Systems    ? ?   ?Objective:  ?  ?Today's Vitals  ? 10/24/21 1322 10/24/21 1323  ?BP: 110/62   ?Pulse: 70   ?Temp: 98.1 ?F (36.7 ?C)   ?TempSrc: Oral   ?SpO2: 98%   ?Weight: 130 lb 9.6 oz (59.2 kg)   ?Height: '5\' 4"'$  (1.626 m)   ?PainSc:  0-No pain  ? ?Body mass index is 22.42 kg/m?. ? ? ?  10/24/2021  ?  1:43 PM 10/12/2021  ?  6:58 PM 04/10/2021  ? 12:02 PM 09/18/2016  ?  1:33 PM 08/24/2015  ?  4:16 PM  ?Advanced Directives  ?Does Patient Have a Medical Advance Directive? Yes No Yes Yes Yes  ?Type of Paramedic of [place];Living will  [place];Living will [place];Living will Healthcare Power of Attorney  ?Does patient want to make changes to medical advance directive? No - Patient declined   No - Patient declined No - Patient declined  ?Copy of [place] in Chart? No - copy requested  No - copy requested No - copy requested No - copy requested  ? ? ?Current Medications (verified) ?Outpatient Encounter Medications as of 10/24/2021  ?Medication Sig  ? aspirin EC 81 MG tablet Take 81 mg by mouth every evening.   ? calcium citrate-vitamin D (CITRACAL+D) 315-200 MG-UNIT tablet Take 1 tablet by mouth 2 (two) times daily.  ? cetirizine (ZYRTEC) 10 MG tablet Take by mouth.  ? Cholecalciferol 25 MCG (1000 UT) capsule Take 2,000 Units by mouth.  ? citalopram (CELEXA) 20 MG tablet TAKE ONE TABLET BY MOUTH DAILY  ? estradiol (CLIMARA) 0.025 mg/24hr patch Place 1 patch (0.025 mg total) onto the skin once a week.  ? Ferrous Sulfate (IRON PO) Take by mouth.  ? medroxyPROGESTERone (PROVERA) 2.5 MG tablet Take 1 tablet (2.5 mg total) by mouth daily.  ? ?No facility-administered encounter medications on file as of 10/24/2021.  ? ? ?Allergies (verified) ?Alpha-gal, Bactrim [sulfamethoxazole-trimethoprim], and  Sulfamethoxazole  ? ?History: ?Past Medical History:  ?Diagnosis Date  ? Amenorrhea   ? Anemia   ? history of at age 73  ? Arthritis   ? bilateral hands  ? Breast nodule 09/2006  ? no mass, patient denies being told about this issue 09/18/2016  ? Cancer [facility])   ? melanoma  ? Depression   ? Hx of melanoma of skin 2011  ? left foot   ? Mood disorder ([place])   ? PONV (postoperative nausea and vomiting)   ? ?Past Surgical History:  ?Procedure Laterality Date  ? BUNIONECTOMY    ? right  ? CERVICAL SPINE SURGERY    ? COLONOSCOPY    ? DILATATION & CURETTAGE/HYSTEROSCOPY WITH MYOSURE N/A 09/22/2016  ? Procedure: DILATATION & CURETTAGE/HYSTEROSCOPY WITH MYOSURE (possible Myosure);  Surgeon: Salvadore Dom, MD;  Location: [facility];  Service: Gynecology;  Laterality: N/A;  follow first case  ? EYE SURGERY    ? LAPAROSCOPIC BILATERAL SALPINGO OOPHERECTOMY Bilateral 09/04/2015  ? Procedure: LAPAROSCOPIC BILATERAL SALPINGO OOPHORECTOMY with pelvic washings;  Surgeon: Salvadore Dom, MD;  Location: [facility];  Service: Gynecology;  Laterality: Bilateral;  ? MELANOMA EXCISION  07/28/2010  ? left foot   ? OVARIAN CYST REMOVAL    ? ?Family History  ?Problem Relation Age of Onset  ? Macular degeneration Mother   ?  Hypertension Mother   ? Dementia Mother   ? Atrial fibrillation Mother   ? Hyperlipidemia Mother   ? Arthritis Mother   ? Depression Mother   ? Hearing loss Mother   ? Heart disease Mother   ? Miscarriages / [place] Mother   ? Diabetes Father   ? Heart attack Father   ?     x 2  ? Heart disease Father   ? Arthritis Father   ?     mostly in back; shoulder  ? Hyperlipidemia Father   ? Hypertension Father   ? Kidney disease Father   ? Depression Father   ? Hearing loss Father   ? Renal Disease Father   ? Alcohol abuse Sister   ? Arthritis Sister   ? Depression Sister   ? Drug abuse Sister   ? Hearing loss Sister   ? Hypertension Sister   ? Learning disabilities Sister   ? Other Sister   ?     blood disorder - not  hemochromotosis but similar requiring regular phlebotomy  ? Alcohol abuse Brother   ? Deep vein thrombosis Brother   ?     disable and immoble also nephew  ? Arthritis Brother   ? Drug abuse Brother   ? Early death Brother   ? Hypertension Brother   ? Ovarian cancer Sister 39  ? Cancer Sister   ? Early death Sister   ? Lung cancer Other   ? Colon cancer Other   ? Colon cancer Maternal Grandfather 51  ? Cancer Maternal Grandfather   ? Early death Maternal Grandfather   ? Diabetes Paternal Grandmother   ? Heart attack Paternal Grandmother 68  ? Early death Paternal Grandmother   ? Hearing loss Paternal Grandmother   ? Heart disease Paternal Grandmother   ? Hypertension Paternal Grandmother   ? Throat cancer Paternal Grandfather   ?     smoker  ? Cancer Paternal Grandfather   ? Depression Paternal Grandfather   ? Early death Paternal Grandfather   ? Colon cancer Paternal Grandfather   ? Heart attack Maternal Grandmother   ? Early death Maternal Grandmother   ? ?Social History  ? ?Socioeconomic History  ? Marital status: Single  ?  Spouse name: Not on file  ? Number of children: Not on file  ? Years of education: Not on file  ? Highest education level: Not on file  ?Occupational History  ? Not on file  ?Tobacco Use  ? Smoking status: Never  ? Smokeless tobacco: Never  ?Vaping Use  ? Vaping Use: Never used  ?Substance and Sexual Activity  ? Alcohol use: Yes  ?  Alcohol/week: 5.0 standard drinks  ?  Types: 5 Glasses of wine per week  ? Drug use: No  ? Sexual activity: Not Currently  ?  Partners: Male  ?  Birth control/protection: Post-menopausal  ?  Comment: BSO  ?Other Topics Concern  ? Not on file  ?Social History Narrative  ? Single  ? HH of 1  ? Pets 1 Cat  ? Neg ets firearms wears seat belts has smoke alarm.   ? G1  ?  2 jobs   ?   ? Had smoked a few time per year  And stopped totally for 3 years.   ? Sister d  ovarian cancer/ parents ailing declining they live in [place] she is to travel back and forth. Sr.  alcoholic  ? Occasional social alcohol  ? Down to  40 hours   ?   ?   ?   ? ?Social Determinants of Health  ? ?Financial Resource Strain: Low Risk   ? Difficulty of Paying Living Expenses: Not hard at all  ?Food Insecurity: No Food Insecurity  ? Worried About Charity fundraiser in the Last Year: Never true  ? Ran Out of Food in the Last Year: Never true  ?Transportation Needs: Unknown  ? Lack of Transportation (Medical): Not on file  ? Lack of Transportation (Non-Medical): No  ?Physical Activity: Inactive  ? Days of Exercise per Week: 0 days  ? Minutes of Exercise per Session: 0 min  ?Stress: Unknown  ? Feeling of Stress : Patient refused  ?Social Connections: Socially Isolated  ? Frequency of Communication with Friends and Family: More than three times a week  ? Frequency of Social Gatherings with Friends and Family: More than three times a week  ? Attends Religious Services: Never  ? Active Member of Clubs or Organizations: No  ? Attends Archivist Meetings: Never  ? Marital Status: Never married  ? ? ? ?Clinical Intake: ? ? ?Diabetic?  No ? ?Activities of Daily Living ? ?  10/24/2021  ?  1:39 PM  ?In your present state of health, do you have any difficulty performing the following activities:  ?Hearing? 0  ?Vision? 0  ?Difficulty concentrating or making decisions? 0  ?Walking or climbing stairs? 0  ?Dressing or bathing? 0  ?Doing errands, shopping? 0  ?Preparing Food and eating ? N  ?Using the Toilet? N  ?In the past six months, have you accidently leaked urine? N  ?Do you have problems with loss of bowel control? N  ?Managing your Medications? N  ?Managing your Finances? N  ?Housekeeping or managing your Housekeeping? N  ? ? ?Patient Care Team: ?Caren Macadam, MD as PCP - General (Family Medicine) ?Salvadore Dom, MD as Consulting Physician (Obstetrics and Gynecology) ?Ulla Gallo, MD as Consulting Physician (Dermatology) ? ?Indicate any recent Medical Services you may have received  from other than [facility] providers in the past year (date may be approximate). ? ?   ?Assessment:  ? This is a routine wellness examination for [place]. ? ?Hearing/Vision screen ?Hearing Screening - Comments:: No difficulty hearin

## 2021-11-04 ENCOUNTER — Other Ambulatory Visit: Payer: Self-pay | Admitting: Obstetrics and Gynecology

## 2021-11-04 DIAGNOSIS — Z79890 Hormone replacement therapy (postmenopausal): Secondary | ICD-10-CM

## 2021-11-04 NOTE — Telephone Encounter (Signed)
Last AEX 12/12/20--scheduled 12/17/21. ?Last mammo 07/24/21.  ?

## 2021-12-03 ENCOUNTER — Ambulatory Visit (INDEPENDENT_AMBULATORY_CARE_PROVIDER_SITE_OTHER): Payer: Medicare Other | Admitting: Nurse Practitioner

## 2021-12-03 ENCOUNTER — Encounter (HOSPITAL_BASED_OUTPATIENT_CLINIC_OR_DEPARTMENT_OTHER): Payer: Self-pay | Admitting: Nurse Practitioner

## 2021-12-03 VITALS — BP 128/82 | HR 73 | Ht 65.0 in | Wt 130.8 lb

## 2021-12-03 DIAGNOSIS — M79641 Pain in right hand: Secondary | ICD-10-CM

## 2021-12-03 DIAGNOSIS — M79642 Pain in left hand: Secondary | ICD-10-CM | POA: Diagnosis not present

## 2021-12-03 DIAGNOSIS — E785 Hyperlipidemia, unspecified: Secondary | ICD-10-CM | POA: Diagnosis not present

## 2021-12-03 DIAGNOSIS — Z0000 Encounter for general adult medical examination without abnormal findings: Secondary | ICD-10-CM

## 2021-12-03 DIAGNOSIS — Z8639 Personal history of other endocrine, nutritional and metabolic disease: Secondary | ICD-10-CM

## 2021-12-03 NOTE — Assessment & Plan Note (Signed)
Chronic.  Arthritic in nature.  Not currently on any daily medications at this time.  Recommend use of topical Voltaren gel daily to help with symptoms.  If symptoms worsen please notify for further evaluation. ?

## 2021-12-03 NOTE — Patient Instructions (Addendum)
Thank you for choosing [facility] for your Primary Care needs. I am excited for the opportunity to partner with you to meet your health care goals. It was a pleasure meeting you today! ? ?Recommendations from today's visit: ? ?If you should have any concerns with an allergy response in the future, please let Kendra Hoffman know. Until you can be seen you may take the following to help with symptoms: ?Pepcid $RemoveBeforeDEI'20mg'RrlhUrsrQJblUpmC$ '40mg'$  twice a day ?Benadryl 25-$RemoveBeforeDE'50mg'FmZzOvpzMmAbnid$  every 4-6 hours ? ?We will get some labs today and make sure everything looks ok.  ? ? ?Information on diet, exercise, and health maintenance recommendations are listed below. This is information to help you be sure you are on track for optimal health and monitoring.  ? ?Please look over this and let Kendra Hoffman know if you have any questions or if you have completed any of the health maintenance outside of [facility] so that we can be sure your records are up to date.  ?___________________________________________________________ ?About Me: ?I am an Adult-Geriatric Nurse Practitioner with a background in caring for patients for more than 20 years with a strong intensive care background. I provide primary care and sports medicine services to patients age 77 and older within this office. My education had a strong focus on caring for the older adult population, which I am passionate about. I am also the director of the APP Fellowship with [facility].  ? ?My desire is to provide you with the best service through preventive medicine and supportive care. I consider you a part of the medical team and value your input. I work diligently to ensure that you are heard and your needs are met in a safe and effective manner. I want you to feel comfortable with me as your provider and want you to know that your health concerns are important to me. ? ?For your information, our office hours are: ?Monday, Tuesday, and Thursday 8:00 AM - 5:00 PM ?Wednesday and Friday 8:00 AM  - 12:00 PM.  ? ?In my time away from the office I am teaching new APP's within the system and am unavailable, but my partner, Dr. Burnard Bunting is in the office for emergent needs.  ? ?If you have questions or concerns, please call our office at [phone] or send Kendra Hoffman a MyChart message and we will respond as quickly as possible.  ?____________________________________________________________ ?MyChart:  ?For all urgent or time sensitive needs we ask that you please call the office to avoid delays. Our number is [phone]. ?MyChart is not constantly monitored and due to the large volume of messages a day, replies may take up to 72 business hours. ? ?MyChart Policy: ?MyChart allows for you to see your visit notes, after visit summary, provider recommendations, lab and tests results, make an appointment, request refills, and contact your provider or the office for non-urgent questions or concerns. Providers are seeing patients during normal business hours and do not have built in time to review MyChart messages.  ?We ask that you allow a minimum of 3 business days for responses to [employer]. For this reason, please do not send urgent requests through [place]. Please call the office at [phone]. ?New and ongoing conditions may require a visit. We have virtual and in person visit available for your convenience.  ?Complex MyChart concerns may require a visit. Your provider may request you schedule a virtual or in person visit to ensure we are providing the best care possible. ?MyChart messages sent after  11:00 AM on Friday will not be received by the provider until Monday morning.  ?  ?Lab and Test Results: ?You will receive your lab and test results on MyChart as soon as they are completed and results have been sent by the lab or testing facility. Due to this service, you will receive your results BEFORE your provider.  ?I review lab and tests results each morning prior to seeing patients. Some results require  collaboration with other providers to ensure you are receiving the most appropriate care. For this reason, we ask that you please allow a minimum of 3-5 business days from the time the ALL results have been received for your provider to receive and review lab and test results and contact you about these.  ?Most lab and test result comments from the provider will be sent through [facility]. Your provider may recommend changes to the plan of care, follow-up visits, repeat testing, ask questions, or request an office visit to discuss these results. You may reply directly to this message or call the office at [phone] to provide information for the provider or set up an appointment. ?In some instances, you will be called with test results and recommendations. Please let Kendra Hoffman know if this is preferred and we will make note of this in your chart to provide this for you.    ?If you have not heard a response to your lab or test results in 5 business days from all results returning to [place], please call the office to let Kendra Hoffman know. We ask that you please avoid calling prior to this time unless there is an emergent concern. Due to high call volumes, this can delay the resulting process. ? ?After Hours: ?For all non-emergency after hours needs, please call the office at [phone] and select the option to reach the on-call provider service. On-call services are shared between multiple [facility] offices and therefore it will not be possible to speak directly with your provider. On-call providers may provide medical advice and recommendations, but are unable to provide refills for maintenance medications.  ?For all emergency or urgent medical needs after normal business hours, we recommend that you seek care at the closest Urgent Care or Emergency Department to ensure appropriate treatment in a timely manner.  ?[facility] has a 24 hour emergency room located on the ground floor for your convenience.   ? ?Urgent Concerns During the Business Day ?Providers are seeing patients from 8AM to [place] with a busy schedule and are most often not able to respond to non-urgent calls until the end of the day or the next business day. ?If you should have URGENT concerns during the day, please call and speak to the nurse or schedule a same day appointment so that we can address your concern without delay.  ? ?Thank you, again, for choosing me as your health care partner. I appreciate your trust and look forward to learning more about you.  ? ?Worthy Keeler, DNP, AGNP-c ?___________________________________________________________ ? ?Health Maintenance Recommendations ?Screening Testing ?Mammogram ?Every 1 -2 years based on history and risk factors ?Starting at age 74 ?Pap Smear ?Ages 21-39 every 3 years ?Ages 70-65 every 5 years with HPV testing ?More frequent testing may be required based on results and history ?Colon Cancer Screening ?Every 1-10 years based on test performed, risk factors, and history ?Starting at age 45 ?Bone Density Screening ?Every 2-10 years based on history ?Starting at age 64 for women ?Recommendations for men differ based on  medication usage, history, and risk factors ?AAA Screening ?One time ultrasound ?Men 14-62 years old who have every smoked ?Lung Cancer Screening ?Low Dose Lung CT every 12 months ?Age 97-80 years with a 30 pack-year smoking history who still smoke or who have quit within the last 15 years ? ?Screening Labs ?Routine  Labs: Complete Blood Count (CBC), Complete Metabolic Panel (CMP), Cholesterol (Lipid Panel) ?Every 6-12 months based on history and medications ?May be recommended more frequently based on current conditions or previous results ?Hemoglobin A1c Lab ?Every 3-12 months based on history and previous results ?Starting at age 74 or earlier with diagnosis of diabetes, high cholesterol, BMI >26, and/or risk factors ?Frequent monitoring for patients with diabetes to ensure blood  sugar control ?Thyroid Panel (TSH w/ T3 & T4) ?Every 6 months based on history, symptoms, and risk factors ?May be repeated more often if on medication ?HIV ?One time testing for all patients 13 and olde

## 2021-12-03 NOTE — Assessment & Plan Note (Signed)
Historical.  Previously on iron supplementation.  Not currently taking this.  We will review labs today and make recommendations to changes in the plan of care based on findings as appropriate.  No alarm symptoms present today. ?

## 2021-12-03 NOTE — Assessment & Plan Note (Signed)
Historical based on chart review.  We will obtain labs today for evaluation and make recommendations to changes in the plan of care based on laboratory findings.  In the setting of significant cardiovascular family history strongly recommend strict control of lipid levels to help reduce risk factors. ?

## 2021-12-03 NOTE — Progress Notes (Signed)
?Orma Render, DNP, AGNP-c ?Primary Care & Sports Medicine ?[place], [place] [address] ?[phone] ? ?New patient visit ? ? ?Patient: Kendra Hoffman   DOB: Jun 02, 1956   66 y.o. Female  MRN: [id number] ?Visit Date: 12/03/2021 ? ?Patient Care Team: ?Kemaya Dorner, Coralee Pesa, NP as PCP - General (Nurse Practitioner) ?Salvadore Dom, MD as Consulting Physician (Obstetrics and Gynecology) ?Ulla Gallo, MD as Consulting Physician (Dermatology) ? ?Today's Vitals  ? 12/03/21 1502  ?BP: 128/82  ?Pulse: 73  ?SpO2: 99%  ?Weight: 130 lb 12.8 oz (59.3 kg)  ?Height: '5\' 5"'$  (1.651 m)  ? ?Body mass index is 21.77 kg/m?.  ? ?Today's healthcare provider: Orma Render, NP  ? ?Chief Complaint  ?Patient presents with  ? Establish Care  ? ?Subjective  ?  ?Atleigh Gruen Hoffman is a 66 y.o. female who presents today as a new patient to establish care.  ?  ?Patient endorses the following concerns presently: ?Alpha gal allergy ?-Beth would like to know if there are any new recommendations for treatments to help with alpha gal allergy ?-She endorses significant reaction with exposure to meat including intense pruritus and hives as well as GI distress ?-She tells me that she avoids red meats consistently however occasionally does experience outbreaks when she has been advertently consumed foods containing red meat ?-She endorses reading recently that acupuncture was a treatment modality recommended and would like my opinion on this ? ?Arthritis ?-She endorses a history of osteoarthritis in her hands bilaterally for several years ?-Recently she has also started to experience knee pain in the left knee feels this may be arthritic in nature ?-She is not currently on any daily medications for this ? ?Cardiovascular work-up ?-She reports a complete cardiovascular work-up in 2022 with all negative findings ?-She endorses a strong family history of cardiovascular disease and reports that she  requested this work-up as a precautionary measure ?-She denies any current concerning symptoms of cardiovascular abnormality. ? ?She also endorses a history of cervical fusion of 1 level and hemangioma growth removal from behind the eye ? ?History reviewed and reveals the following: ?Past Medical History:  ?Diagnosis Date  ? Amenorrhea   ? Anemia   ? history of at age 58  ? Arthritis   ? bilateral hands  ? Breast nodule 09/2006  ? no mass, patient denies being told about this issue 09/18/2016  ? Cancer [facility])   ? melanoma  ? Depression   ? Hx of melanoma of skin 2011  ? left foot   ? Mood disorder ([place])   ? PONV (postoperative nausea and vomiting)   ? ?Past Surgical History:  ?Procedure Laterality Date  ? BUNIONECTOMY    ? right  ? CERVICAL SPINE SURGERY    ? COLONOSCOPY    ? DILATATION & CURETTAGE/HYSTEROSCOPY WITH MYOSURE N/A 09/22/2016  ? Procedure: DILATATION & CURETTAGE/HYSTEROSCOPY WITH MYOSURE (possible Myosure);  Surgeon: Salvadore Dom, MD;  Location: [facility];  Service: Gynecology;  Laterality: N/A;  follow first case  ? EYE SURGERY    ? LAPAROSCOPIC BILATERAL SALPINGO OOPHERECTOMY Bilateral 09/04/2015  ? Procedure: LAPAROSCOPIC BILATERAL SALPINGO OOPHORECTOMY with pelvic washings;  Surgeon: Salvadore Dom, MD;  Location: [facility];  Service: Gynecology;  Laterality: Bilateral;  ? MELANOMA EXCISION  07/28/2010  ? left foot   ? OVARIAN CYST REMOVAL    ? ?Family Status  ?Relation Name Status  ? Mother  Deceased  ? Father  Alive  ?  Sister  Alive  ? Brother  Deceased  ? Sister  Deceased  ? Other  (Not Specified)  ? Other  (Not Specified)  ? MGF  Deceased  ? PGM  Deceased  ? PGF  Deceased  ? MGM  Alive  ? ?Family History  ?Problem Relation Age of Onset  ? Macular degeneration Mother   ? Hypertension Mother   ? Dementia Mother   ? Atrial fibrillation Mother   ? Hyperlipidemia Mother   ? Arthritis Mother   ? Depression Mother   ? Hearing loss Mother   ? Heart disease Mother   ? Miscarriages / [place]  Mother   ? Diabetes Father   ? Heart attack Father   ?     x 2  ? Heart disease Father   ? Arthritis Father   ?     mostly in back; shoulder  ? Hyperlipidemia Father   ? Hypertension Father   ? Kidney disease Father   ? Depression Father   ? Hearing loss Father   ? Renal Disease Father   ? Alcohol abuse Sister   ? Arthritis Sister   ? Depression Sister   ? Drug abuse Sister   ? Hearing loss Sister   ? Hypertension Sister   ? Learning disabilities Sister   ? Other Sister   ?     blood disorder - not hemochromotosis but similar requiring regular phlebotomy  ? Alcohol abuse Brother   ? Deep vein thrombosis Brother   ?     disable and immoble also nephew  ? Arthritis Brother   ? Drug abuse Brother   ? Vanda Waskey death Brother   ? Hypertension Brother   ? Ovarian cancer Sister 74  ? Cancer Sister   ? Ivin Rosenbloom death Sister   ? Lung cancer Other   ? Colon cancer Other   ? Colon cancer Maternal Grandfather 29  ? Cancer Maternal Grandfather   ? Jahkari Maclin death Maternal Grandfather   ? Diabetes Paternal Grandmother   ? Heart attack Paternal Grandmother 11  ? Marise Knapper death Paternal Grandmother   ? Hearing loss Paternal Grandmother   ? Heart disease Paternal Grandmother   ? Hypertension Paternal Grandmother   ? Throat cancer Paternal Grandfather   ?     smoker  ? Cancer Paternal Grandfather   ? Depression Paternal Grandfather   ? Caelyn Route death Paternal Grandfather   ? Colon cancer Paternal Grandfather   ? Heart attack Maternal Grandmother   ? Parul Porcelli death Maternal Grandmother   ? ?Social History  ? ?Socioeconomic History  ? Marital status: Single  ?  Spouse name: Not on file  ? Number of children: Not on file  ? Years of education: Not on file  ? Highest education level: Not on file  ?Occupational History  ? Not on file  ?Tobacco Use  ? Smoking status: Never  ? Smokeless tobacco: Never  ?Vaping Use  ? Vaping Use: Never used  ?Substance and Sexual Activity  ? Alcohol use: Yes  ?  Alcohol/week: 5.0 standard drinks  ?  Types: 5 Glasses of wine per  week  ? Drug use: No  ? Sexual activity: Not Currently  ?  Partners: Male  ?  Birth control/protection: Post-menopausal  ?  Comment: BSO  ?Other Topics Concern  ? Not on file  ?Social History Narrative  ? Single  ? HH of 1  ? Pets 1 Cat  ? Neg ets firearms wears seat belts has smoke  alarm.   ? G1  ?  2 jobs   ?   ? Had smoked a few time per year  And stopped totally for 3 years.   ? Sister d  ovarian cancer/ parents ailing declining they live in [place] she is to travel back and forth. Sr. alcoholic  ? Occasional social alcohol  ? Down to 40 hours   ?   ?   ?   ? ?Social Determinants of Health  ? ?Financial Resource Strain: Low Risk   ? Difficulty of Paying Living Expenses: Not hard at all  ?Food Insecurity: No Food Insecurity  ? Worried About Charity fundraiser in the Last Year: Never true  ? Ran Out of Food in the Last Year: Never true  ?Transportation Needs: Unknown  ? Lack of Transportation (Medical): Not on file  ? Lack of Transportation (Non-Medical): No  ?Physical Activity: Inactive  ? Days of Exercise per Week: 0 days  ? Minutes of Exercise per Session: 0 min  ?Stress: Unknown  ? Feeling of Stress : Patient refused  ?Social Connections: Socially Isolated  ? Frequency of Communication with Friends and Family: More than three times a week  ? Frequency of Social Gatherings with Friends and Family: More than three times a week  ? Attends Religious Services: Never  ? Active Member of Clubs or Organizations: No  ? Attends Archivist Meetings: Never  ? Marital Status: Never married  ? ?Outpatient Medications Prior to Visit  ?Medication Sig  ? aspirin EC 81 MG tablet Take 81 mg by mouth every evening.   ? calcium citrate-vitamin D (CITRACAL+D) 315-200 MG-UNIT tablet Take 1 tablet by mouth 2 (two) times daily.  ? cetirizine (ZYRTEC) 10 MG tablet Take by mouth.  ? Cholecalciferol 25 MCG (1000 UT) capsule Take 2,000 Units by mouth.  ? citalopram (CELEXA) 20 MG tablet TAKE ONE TABLET BY MOUTH DAILY  ?  estradiol (CLIMARA - DOSED IN MG/24 HR) 0.025 mg/24hr patch PLACE 1 PATCH ONTO THE SKIN ONCE A WEEK AS DIRECTED  ? medroxyPROGESTERone (PROVERA) 2.5 MG tablet Take 1 tablet (2.5 mg total) by mouth daily.  ? [DISCONTIN

## 2021-12-04 LAB — CBC WITH DIFFERENTIAL/PLATELET
Basophils Absolute: 0 10*3/uL (ref 0.0–0.2)
Basos: 0 %
EOS (ABSOLUTE): 0.2 10*3/uL (ref 0.0–0.4)
Eos: 4 %
Hematocrit: 43.5 % (ref 34.0–46.6)
Hemoglobin: 14.6 g/dL (ref 11.1–15.9)
Immature Grans (Abs): 0 10*3/uL (ref 0.0–0.1)
Immature Granulocytes: 0 %
Lymphocytes Absolute: 2.5 10*3/uL (ref 0.7–3.1)
Lymphs: 43 %
MCH: 31.9 pg (ref 26.6–33.0)
MCHC: 33.6 g/dL (ref 31.5–35.7)
MCV: 95 fL (ref 79–97)
Monocytes Absolute: 0.7 10*3/uL (ref 0.1–0.9)
Monocytes: 11 %
Neutrophils Absolute: 2.4 10*3/uL (ref 1.4–7.0)
Neutrophils: 42 %
Platelets: 166 10*3/uL (ref 150–450)
RBC: 4.57 x10E6/uL (ref 3.77–5.28)
RDW: 12.2 % (ref 11.7–15.4)
WBC: 5.8 10*3/uL (ref 3.4–10.8)

## 2021-12-04 LAB — COMPREHENSIVE METABOLIC PANEL
ALT: 14 IU/L (ref 0–32)
AST: 23 IU/L (ref 0–40)
Albumin/Globulin Ratio: 1.9 (ref 1.2–2.2)
Albumin: 4.6 g/dL (ref 3.8–4.8)
Alkaline Phosphatase: 89 IU/L (ref 44–121)
BUN/Creatinine Ratio: 12 (ref 12–28)
BUN: 11 mg/dL (ref 8–27)
Bilirubin Total: 0.6 mg/dL (ref 0.0–1.2)
CO2: 22 mmol/L (ref 20–29)
Calcium: 10.3 mg/dL (ref 8.7–10.3)
Chloride: 100 mmol/L (ref 96–106)
Creatinine, Ser: 0.9 mg/dL (ref 0.57–1.00)
Globulin, Total: 2.4 g/dL (ref 1.5–4.5)
Glucose: 82 mg/dL (ref 70–99)
Potassium: 4.1 mmol/L (ref 3.5–5.2)
Sodium: 139 mmol/L (ref 134–144)
Total Protein: 7 g/dL (ref 6.0–8.5)
eGFR: 71 mL/min/{1.73_m2} (ref >59)

## 2021-12-04 LAB — LIPID PANEL
Chol/HDL Ratio: 3.3 ratio (ref 0.0–4.4)
Cholesterol, Total: 219 mg/dL — ABNORMAL HIGH (ref 100–199)
HDL: 67 mg/dL (ref >39)
LDL Chol Calc (NIH): 137 mg/dL — ABNORMAL HIGH (ref 0–99)
Triglycerides: 85 mg/dL (ref 0–149)
VLDL Cholesterol Cal: 15 mg/dL (ref 5–40)

## 2021-12-04 LAB — B12 AND FOLATE PANEL
Folate: >20 ng/mL (ref >3.0)
Vitamin B-12: 584 pg/mL (ref 232–1245)

## 2021-12-04 LAB — IRON,TIBC AND FERRITIN PANEL
Ferritin: 60 ng/mL (ref 15–150)
Iron Saturation: 31 % (ref 15–55)
Iron: 98 ug/dL (ref 27–139)
Total Iron Binding Capacity: 313 ug/dL (ref 250–450)
UIBC: 215 ug/dL (ref 118–369)

## 2021-12-04 LAB — TSH: TSH: 2.1 u[IU]/mL (ref 0.450–4.500)

## 2021-12-04 LAB — HEMOGLOBIN A1C
Est. average glucose Bld gHb Est-mCnc: 120 mg/dL
Hgb A1c MFr Bld: 5.8 % — ABNORMAL HIGH (ref 4.8–5.6)

## 2021-12-04 LAB — VITAMIN D 25 HYDROXY (VIT D DEFICIENCY, FRACTURES): Vit D, 25-Hydroxy: 64.9 ng/mL (ref 30.0–100.0)

## 2021-12-04 LAB — T4, FREE: Free T4: 1.05 ng/dL (ref 0.82–1.77)

## 2021-12-14 ENCOUNTER — Other Ambulatory Visit: Payer: Self-pay | Admitting: Obstetrics and Gynecology

## 2021-12-14 DIAGNOSIS — Z79890 Hormone replacement therapy (postmenopausal): Secondary | ICD-10-CM

## 2021-12-16 ENCOUNTER — Other Ambulatory Visit: Payer: Self-pay

## 2021-12-16 NOTE — Telephone Encounter (Signed)
Error. Duplicate RX request.  Already ordered 12/12/21.

## 2021-12-16 NOTE — Telephone Encounter (Signed)
Last annual exam was 11/2020 Annual Scheduled on 12/27/21 Mammogram 1/23

## 2021-12-17 ENCOUNTER — Ambulatory Visit: Payer: Medicare Other | Admitting: Obstetrics and Gynecology

## 2021-12-24 NOTE — Progress Notes (Deleted)
66 y.o. G1P0010 Single White or Caucasian Not Hispanic or Latino female here for annual exam.      Patient's last menstrual period was 07/28/2008.          Sexually active: {yes no:314532}  The current method of family planning is {contraception:315051}.    Exercising: {yes no:314532}  {types:19826} Smoker:  {YES [id number]  Health Maintenance: Pap:  12/07/19 Normal. HPV Neg. History of abnormal Pap:  {YES NO:22349} MMG:  07/30/21 Bi-rads 1 neg  BMD:   *** Colonoscopy: *** TDaP:  *** Gardasil: ***   reports that she has never smoked. She has never used smokeless tobacco. She reports current alcohol use of about 5.0 standard drinks per week. She reports that she does not use drugs.  Past Medical History:  Diagnosis Date   Amenorrhea    Anemia    history of at age 32   Arthritis    bilateral hands   Breast nodule 09/2006   no mass, patient denies being told about this issue 09/18/2016   Cancer ([place])    melanoma   Depression    Hx of melanoma of skin 2011   left foot    Mood disorder ([facility])    PONV (postoperative nausea and vomiting)     Past Surgical History:  Procedure Laterality Date   BUNIONECTOMY     right   CERVICAL SPINE SURGERY     COLONOSCOPY     DILATATION & CURETTAGE/HYSTEROSCOPY WITH MYOSURE N/A 09/22/2016   Procedure: DILATATION & CURETTAGE/HYSTEROSCOPY WITH MYOSURE (possible Myosure);  Surgeon: Salvadore Dom, MD;  Location: [facility];  Service: Gynecology;  Laterality: N/A;  follow first case   EYE SURGERY     LAPAROSCOPIC BILATERAL SALPINGO OOPHERECTOMY Bilateral 09/04/2015   Procedure: LAPAROSCOPIC BILATERAL SALPINGO OOPHORECTOMY with pelvic washings;  Surgeon: Salvadore Dom, MD;  Location: [facility];  Service: Gynecology;  Laterality: Bilateral;   MELANOMA EXCISION  07/28/2010   left foot    OVARIAN CYST REMOVAL      Current Outpatient Medications  Medication Sig Dispense Refill   aspirin EC 81 MG tablet Take 81 mg by mouth every evening.       calcium citrate-vitamin D (CITRACAL+D) 315-200 MG-UNIT tablet Take 1 tablet by mouth 2 (two) times daily.     cetirizine (ZYRTEC) 10 MG tablet Take by mouth.     Cholecalciferol 25 MCG (1000 UT) capsule Take 2,000 Units by mouth.     citalopram (CELEXA) 20 MG tablet TAKE ONE TABLET BY MOUTH DAILY 90 tablet 3   estradiol (CLIMARA - DOSED IN MG/24 HR) 0.025 mg/24hr patch PLACE 1 PATCH ONTO THE SKIN ONCE A WEEK AS DIRECTED 12 patch 0   medroxyPROGESTERone (PROVERA) 2.5 MG tablet TAKE ONE TABLET BY MOUTH DAILY 30 tablet 0   No current facility-administered medications for this visit.    Family History  Problem Relation Age of Onset   Macular degeneration Mother    Hypertension Mother    Dementia Mother    Atrial fibrillation Mother    Hyperlipidemia Mother    Arthritis Mother    Depression Mother    Hearing loss Mother    Heart disease Mother    Miscarriages / [place] Mother    Diabetes Father    Heart attack Father        x 2   Heart disease Father    Arthritis Father        mostly in back; shoulder   Hyperlipidemia Father    Hypertension  Father    Kidney disease Father    Depression Father    Hearing loss Father    Renal Disease Father    Alcohol abuse Sister    Arthritis Sister    Depression Sister    Drug abuse Sister    Hearing loss Sister    Hypertension Sister    Learning disabilities Sister    Other Sister        blood disorder - not hemochromotosis but similar requiring regular phlebotomy   Alcohol abuse Brother    Deep vein thrombosis Brother        disable and immoble also nephew   Arthritis Brother    Drug abuse Brother    Early death Brother    Hypertension Brother    Ovarian cancer Sister 31   Cancer Sister    Early death Sister    Lung cancer Other    Colon cancer Other    Colon cancer Maternal Grandfather 57   Cancer Maternal Grandfather    Early death Maternal Grandfather    Diabetes Paternal Grandmother    Heart attack Paternal Grandmother  48   Early death Paternal Grandmother    Hearing loss Paternal Grandmother    Heart disease Paternal Grandmother    Hypertension Paternal Grandmother    Throat cancer Paternal Grandfather        smoker   Cancer Paternal Grandfather    Depression Paternal Grandfather    Early death Paternal Grandfather    Colon cancer Paternal Grandfather    Heart attack Maternal Grandmother    Early death Maternal Grandmother     Review of Systems  Exam:   LMP 07/28/2008   Weight change: '@WEIGHTCHANGE'$ @ Height:      Ht Readings from Last 3 Encounters:  12/03/21 '5\' 5"'$  (1.651 m)  10/24/21 '5\' 4"'$  (1.626 m)  10/23/21 '5\' 4"'$  (1.626 m)    General appearance: alert, cooperative and appears stated age Head: Normocephalic, without obvious abnormality, atraumatic Neck: no adenopathy, supple, symmetrical, trachea midline and thyroid {CHL AMB PHY EX THYROID NORM DEFAULT:[phone]::"normal to inspection and palpation"} Lungs: clear to auscultation bilaterally Cardiovascular: regular rate and rhythm Breasts: {Exam; breast:13139::"normal appearance, no masses or tenderness"} Abdomen: soft, non-tender; non distended,  no masses,  no organomegaly Extremities: extremities normal, atraumatic, no cyanosis or edema Skin: Skin color, texture, turgor normal. No rashes or lesions Lymph nodes: Cervical, supraclavicular, and axillary nodes normal. No abnormal inguinal nodes palpated Neurologic: Grossly normal   Pelvic: External genitalia:  no lesions              Urethra:  normal appearing urethra with no masses, tenderness or lesions              Bartholins and Skenes: normal                 Vagina: normal appearing vagina with normal color and discharge, no lesions              Cervix: {CHL AMB PHY EX CERVIX NORM DEFAULT:[phone]::"no lesions"}               Bimanual Exam:  Uterus:  {CHL AMB PHY EX UTERUS NORM DEFAULT:[phone]::"normal size, contour, position, consistency, mobility, non-tender"}               Adnexa: {CHL AMB PHY EX ADNEXA NO MASS [phone]::"no mass, fullness, tenderness"}               Rectovaginal: Confirms  Anus:  normal sphincter tone, no lesions  *** chaperoned for the exam.  A:  Well Woman with normal exam  P:

## 2021-12-27 ENCOUNTER — Ambulatory Visit: Payer: Medicare Other | Admitting: Obstetrics and Gynecology

## 2021-12-27 ENCOUNTER — Encounter: Payer: Self-pay | Admitting: Obstetrics and Gynecology

## 2021-12-27 ENCOUNTER — Ambulatory Visit (INDEPENDENT_AMBULATORY_CARE_PROVIDER_SITE_OTHER): Payer: Medicare Other | Admitting: Obstetrics and Gynecology

## 2021-12-27 ENCOUNTER — Other Ambulatory Visit (HOSPITAL_COMMUNITY)
Admission: RE | Admit: 2021-12-27 | Discharge: 2021-12-27 | Disposition: A | Discharge status: Home / Self Care | Payer: Medicare Other | Source: Ambulatory Visit | Attending: Obstetrics and Gynecology | Admitting: Obstetrics and Gynecology

## 2021-12-27 VITALS — BP 128/72 | HR 77 | Ht 64.5 in | Wt 132.0 lb

## 2021-12-27 DIAGNOSIS — Z79890 Hormone replacement therapy (postmenopausal): Secondary | ICD-10-CM | POA: Diagnosis not present

## 2021-12-27 DIAGNOSIS — Z9189 Other specified personal risk factors, not elsewhere classified: Secondary | ICD-10-CM | POA: Diagnosis not present

## 2021-12-27 DIAGNOSIS — Z124 Encounter for screening for malignant neoplasm of cervix: Secondary | ICD-10-CM | POA: Insufficient documentation

## 2021-12-27 DIAGNOSIS — B009 Herpesviral infection, unspecified: Secondary | ICD-10-CM | POA: Diagnosis not present

## 2021-12-27 DIAGNOSIS — Z8659 Personal history of other mental and behavioral disorders: Secondary | ICD-10-CM

## 2021-12-27 DIAGNOSIS — Z01419 Encounter for gynecological examination (general) (routine) without abnormal findings: Secondary | ICD-10-CM

## 2021-12-27 MED ORDER — ESTRADIOL 0.025 MG/24HR TD PTWK: MEDICATED_PATCH | TRANSDERMAL | 3 refills | Status: DC

## 2021-12-27 MED ORDER — CITALOPRAM HYDROBROMIDE 20 MG PO TABS: ORAL_TABLET | ORAL | 3 refills | Status: DC

## 2021-12-27 MED ORDER — MEDROXYPROGESTERONE ACETATE 2.5 MG PO TABS: 2.5000 mg | ORAL_TABLET | Freq: Every day | ORAL | 3 refills | Status: DC

## 2021-12-27 NOTE — Patient Instructions (Addendum)
Check on your last colonoscopy. Let me know if you need me to place a referral.  EXERCISE   We recommended that you start or continue a regular exercise program for good health. Physical activity is anything that gets your body moving, some is better than none. The CDC recommends 150 minutes per week of Moderate-Intensity Aerobic Activity and 2 or more days of Muscle Strengthening Activity.  Benefits of exercise are limitless: helps weight loss/weight maintenance, improves mood and energy, helps with depression and anxiety, improves sleep, tones and strengthens muscles, improves balance, improves bone density, protects from chronic conditions such as heart disease, high blood pressure and diabetes and so much more. To learn more visit: WhyNotPoker.uy  DIET: Good nutrition starts with a healthy diet of fruits, vegetables, whole grains, and lean protein sources. Drink plenty of water for hydration. Minimize empty calories, sodium, sweets. For more information about dietary recommendations visit: GeekRegister.com.ee and http://schaefer-mitchell.com/  ALCOHOL:  Women should limit their alcohol intake to no more than 7 drinks/beers/glasses of wine (combined, not each!) per week. Moderation of alcohol intake to this level decreases your risk of breast cancer and liver damage.  If you are concerned that you may have a problem, or your friends have told you they are concerned about your drinking, there are many resources to help. A well-known program that is free, effective, and available to all people all over the nation is Alcoholics Anonymous.  Check out this site to learn more: BlockTaxes.se   CALCIUM AND VITAMIN D:  Adequate intake of calcium and Vitamin D are recommended for bone health.  You should be getting between 1000-1200 mg of calcium and 800 units of Vitamin D daily between diet and supplements   MAMMOGRAMS:   All women over 82 years old should have a routine mammogram.   COLON CANCER SCREENING: Now recommend starting at age 50. At this time colonoscopy is not covered for routine screening until 50. There are take home tests that can be done between 45-49.   COLONOSCOPY:  Colonoscopy to screen for colon cancer is recommended for all women at age 1.  We know, you hate the idea of the prep.  We agree, BUT, having colon cancer and not knowing it is worse!!  Colon cancer so often starts as a polyp that can be seen and removed at colonscopy, which can quite literally save your life!  And if your first colonoscopy is normal and you have no family history of colon cancer, most women don't have to have it again for 10 years.  Once every ten years, you can do something that may end up saving your life, right?  We will be happy to help you get it scheduled when you are ready.  Be sure to check your insurance coverage so you understand how much it will cost.  It may be covered as a preventative service at no cost, but you should check your particular policy.      Breast Self-Awareness Breast self-awareness means being familiar with how your breasts look and feel. It involves checking your breasts regularly and reporting any changes to your health care provider. Practicing breast self-awareness is important. A change in your breasts can be a sign of a serious medical problem. Being familiar with how your breasts look and feel allows you to find any problems early, when treatment is more likely to be successful. All women should practice breast self-awareness, including women who have had breast implants. How to do a breast self-exam  One way to learn what is normal for your breasts and whether your breasts are changing is to do a breast self-exam. To do a breast self-exam: Look for Changes  Remove all the clothing above your waist. Stand in front of a mirror in a room with good lighting. Put your hands on your  hips. Push your hands firmly downward. Compare your breasts in the mirror. Look for differences between them (asymmetry), such as: Differences in shape. Differences in size. Puckers, dips, and bumps in one breast and not the other. Look at each breast for changes in your skin, such as: Redness. Scaly areas. Look for changes in your nipples, such as: Discharge. Bleeding. Dimpling. Redness. A change in position. Feel for Changes Carefully feel your breasts for lumps and changes. It is best to do this while lying on your back on the floor and again while sitting or standing in the shower or tub with soapy water on your skin. Feel each breast in the following way: Place the arm on the side of the breast you are examining above your head. Feel your breast with the other hand. Start in the nipple area and make  inch (2 cm) overlapping circles to feel your breast. Use the pads of your three middle fingers to do this. Apply light pressure, then medium pressure, then firm pressure. The light pressure will allow you to feel the tissue closest to the skin. The medium pressure will allow you to feel the tissue that is a little deeper. The firm pressure will allow you to feel the tissue close to the ribs. Continue the overlapping circles, moving downward over the breast until you feel your ribs below your breast. Move one finger-width toward the center of the body. Continue to use the  inch (2 cm) overlapping circles to feel your breast as you move slowly up toward your collarbone. Continue the up and down exam using all three pressures until you reach your armpit.  Write Down What You Find  Write down what is normal for each breast and any changes that you find. Keep a written record with breast changes or normal findings for each breast. By writing this information down, you do not need to depend only on memory for size, tenderness, or location. Write down where you are in your menstrual cycle, if  you are still menstruating. If you are having trouble noticing differences in your breasts, do not get discouraged. With time you will become more familiar with the variations in your breasts and more comfortable with the exam. How often should I examine my breasts? Examine your breasts every month. If you are breastfeeding, the best time to examine your breasts is after a feeding or after using a breast pump. If you menstruate, the best time to examine your breasts is 5-7 days after your period is over. During your period, your breasts are lumpier, and it may be more difficult to notice changes. When should I see my health care provider? See your health care provider if you notice: A change in shape or size of your breasts or nipples. A change in the skin of your breast or nipples, such as a reddened or scaly area. Unusual discharge from your nipples. A lump or thick area that was not there before. Pain in your breasts. Anything that concerns you.

## 2021-12-27 NOTE — Progress Notes (Signed)
66 y.o. G1P0010 Single White or Caucasian Not Hispanic or Latino female here for annual exam.  H/O BSO. On low dose HRT.    Her Dad died in Nov 17, 2022. They were really close.   In last year she had a herniated disc removed and removal of a benign orbital mass.   Patient's last menstrual period was 07/28/2008.          Sexually active: No.  The current method of family planning is post menopausal status.    Exercising: No.  The patient does not participate in regular exercise at present. Smoker:  no  Health Maintenance: Pap:  12/07/19 Normal. HPV Neg. 08/06/17 WNL HR HPV Neg  History of abnormal Pap:  Yes  h/o cryosurgery in 20s/ colposcopy 09-04-16  MMG:  07/30/21 Bi-rads 1 neg  BMD:   01/01/21  normal f/u 10 years Colonoscopy:01/19/2012 Normal repeat in 10 years. She feels she has had one since that colonoscopy. Will check her records.   TDaP:  2012 Gardasil: n/a   reports that she has never smoked. She has never used smokeless tobacco. She reports current alcohol use of about 5.0 standard drinks per week. She reports that she does not use drugs. Retired Scientist, physiological.   Past Medical History:  Diagnosis Date   Amenorrhea    Anemia    history of at age 43   Arthritis    bilateral hands   Breast nodule 09/2006   no mass, patient denies being told about this issue 09/18/2016   Cancer ([facility])    melanoma   Depression    Hx of melanoma of skin 2011   left foot    Mood disorder ([facility])    PONV (postoperative nausea and vomiting)     Past Surgical History:  Procedure Laterality Date   BUNIONECTOMY     right   CERVICAL SPINE SURGERY     COLONOSCOPY     DILATATION & CURETTAGE/HYSTEROSCOPY WITH MYOSURE N/A 09/22/2016   Procedure: DILATATION & CURETTAGE/HYSTEROSCOPY WITH MYOSURE (possible Myosure);  Surgeon: Salvadore Dom, MD;  Location: [facility];  Service: Gynecology;  Laterality: N/A;  follow first case   EYE SURGERY     LAPAROSCOPIC BILATERAL SALPINGO OOPHERECTOMY Bilateral 09/04/2015    Procedure: LAPAROSCOPIC BILATERAL SALPINGO OOPHORECTOMY with pelvic washings;  Surgeon: Salvadore Dom, MD;  Location: [facility];  Service: Gynecology;  Laterality: Bilateral;   MELANOMA EXCISION  07/28/2010   left foot    OVARIAN CYST REMOVAL     TUMOR REMOVAL      Current Outpatient Medications  Medication Sig Dispense Refill   aspirin EC 81 MG tablet Take 81 mg by mouth every evening.      calcium citrate-vitamin D (CITRACAL+D) 315-200 MG-UNIT tablet Take 1 tablet by mouth 2 (two) times daily.     cetirizine (ZYRTEC) 10 MG tablet Take by mouth.     Cholecalciferol 25 MCG (1000 UT) capsule Take 2,000 Units by mouth.     citalopram (CELEXA) 20 MG tablet TAKE ONE TABLET BY MOUTH DAILY 90 tablet 3   estradiol (CLIMARA - DOSED IN MG/24 HR) 0.025 mg/24hr patch PLACE 1 PATCH ONTO THE SKIN ONCE A WEEK AS DIRECTED 12 patch 0   medroxyPROGESTERone (PROVERA) 2.5 MG tablet TAKE ONE TABLET BY MOUTH DAILY 30 tablet 0   No current facility-administered medications for this visit.    Family History  Problem Relation Age of Onset   Macular degeneration Mother    Hypertension Mother    Dementia Mother  Atrial fibrillation Mother    Hyperlipidemia Mother    Arthritis Mother    Depression Mother    Hearing loss Mother    Heart disease Mother    Miscarriages / [place] Mother    Diabetes Father    Heart attack Father        x 2   Heart disease Father    Arthritis Father        mostly in back; shoulder   Hyperlipidemia Father    Hypertension Father    Kidney disease Father    Depression Father    Hearing loss Father    Renal Disease Father    Alcohol abuse Sister    Arthritis Sister    Depression Sister    Drug abuse Sister    Hearing loss Sister    Hypertension Sister    Learning disabilities Sister    Other Sister        blood disorder - not hemochromotosis but similar requiring regular phlebotomy   Alcohol abuse Brother    Deep vein thrombosis Brother        disable and  immoble also nephew   Arthritis Brother    Drug abuse Brother    Early death Brother    Hypertension Brother    Ovarian cancer Sister 48   Cancer Sister    Early death Sister    Lung cancer Other    Colon cancer Other    Colon cancer Maternal Grandfather 40   Cancer Maternal Grandfather    Early death Maternal Grandfather    Diabetes Paternal Grandmother    Heart attack Paternal Grandmother 44   Early death Paternal Grandmother    Hearing loss Paternal Grandmother    Heart disease Paternal Grandmother    Hypertension Paternal Grandmother    Throat cancer Paternal Grandfather        smoker   Cancer Paternal Grandfather    Depression Paternal Grandfather    Early death Paternal Grandfather    Colon cancer Paternal Grandfather    Heart attack Maternal Grandmother    Early death Maternal Grandmother     Review of Systems  All other systems reviewed and are negative.  Exam:   BP 128/72   Pulse 77   Ht 5' 4.5" (1.638 m)   Wt 132 lb (59.9 kg)   LMP 07/28/2008   SpO2 98%   BMI 22.31 kg/m   Weight change: '@WEIGHTCHANGE'$ @ Height:   Height: 5' 4.5" (163.8 cm)  Ht Readings from Last 3 Encounters:  12/27/21 5' 4.5" (1.638 m)  12/03/21 '5\' 5"'$  (1.651 m)  10/24/21 '5\' 4"'$  (1.626 m)    General appearance: alert, cooperative and appears stated age Head: Normocephalic, without obvious abnormality, atraumatic Neck: no adenopathy, supple, symmetrical, trachea midline and thyroid normal to inspection and palpation Lungs: clear to auscultation bilaterally Cardiovascular: regular rate and rhythm Breasts: normal appearance, no masses or tenderness Abdomen: soft, non-tender; non distended,  no masses,  no organomegaly Extremities: extremities normal, atraumatic, no cyanosis or edema Skin: Skin color, texture, turgor normal. No rashes or lesions Lymph nodes: Cervical, supraclavicular, and axillary nodes normal. No abnormal inguinal nodes palpated Neurologic: Grossly normal   Pelvic:  External genitalia:  no lesions              Urethra:  normal appearing urethra with no masses, tenderness or lesions              Bartholins and Skenes: normal  Vagina: normal appearing vagina with normal color and discharge, no lesions              Cervix: no lesions               Bimanual Exam:  Uterus:  normal size, contour, position, consistency, mobility, non-tender              Adnexa: no mass, fullness, tenderness               Rectovaginal: Confirms               Anus:  normal sphincter tone, no lesions  Gae Dry, CMA chaperoned for the exam.  1. Encounter for breast and pelvic examination Normal exam Mammogram UTD She thinks her colonoscopy is UTD, will check her records and let me know. I will place a referral if needed DEXA UTD and normal Discussed breast self exam Discussed calcium and vit D intake  2. Hormone replacement therapy (HRT) She wants to continue for now, may go off in the next year - estradiol (CLIMARA - DOSED IN MG/24 HR) 0.025 mg/24hr patch; PLACE 1 PATCH ONTO THE SKIN ONCE A WEEK AS DIRECTED  Dispense: 12 patch; Refill: 3 - medroxyPROGESTERone (PROVERA) 2.5 MG tablet; Take 1 tablet (2.5 mg total) by mouth daily.  Dispense: 90 tablet; Refill: 3 -She will call if she wants vaginal estrogen  3. History of depression Well controlled - citalopram (CELEXA) 20 MG tablet; TAKE ONE TABLET BY MOUTH DAILY  Dispense: 90 tablet; Refill: 3  4. Screening for cervical cancer Not due until next year, but can only have breast and pelvic every other year.  - Cytology - PAP

## 2021-12-30 LAB — CYTOLOGY - PAP
Adequacy: ABSENT
Diagnosis: NEGATIVE

## 2021-12-31 ENCOUNTER — Ambulatory Visit (HOSPITAL_BASED_OUTPATIENT_CLINIC_OR_DEPARTMENT_OTHER): Payer: Medicare Other | Admitting: Nurse Practitioner

## 2022-01-02 ENCOUNTER — Ambulatory Visit (INDEPENDENT_AMBULATORY_CARE_PROVIDER_SITE_OTHER): Payer: Medicare Other | Admitting: Nurse Practitioner

## 2022-01-02 ENCOUNTER — Encounter (HOSPITAL_BASED_OUTPATIENT_CLINIC_OR_DEPARTMENT_OTHER): Payer: Self-pay | Admitting: Nurse Practitioner

## 2022-01-02 ENCOUNTER — Ambulatory Visit (INDEPENDENT_AMBULATORY_CARE_PROVIDER_SITE_OTHER): Payer: Medicare Other

## 2022-01-02 VITALS — BP 122/82 | HR 96 | Ht 64.0 in | Wt 132.0 lb

## 2022-01-02 DIAGNOSIS — M5416 Radiculopathy, lumbar region: Secondary | ICD-10-CM

## 2022-01-02 MED ORDER — PREDNISONE 20 MG PO TABS: 40.0000 mg | ORAL_TABLET | Freq: Every day | ORAL | 0 refills | Status: DC

## 2022-01-02 NOTE — Patient Instructions (Addendum)
We will get an x-ray and make sure that everything is in alignment.  I have also sent in prednisone to help with inflammation and pain.  I will watch for the results and we will touch base when these comes in

## 2022-01-02 NOTE — Progress Notes (Signed)
Orma Render, DNP, AGNP-c [facility] [address]  [place], [place] [address] [phone]  Subjective:   Kendra Hoffman is a 67 y.o. female presents to day for back pain. Low back and sacral area Started 3 weeks ago Possibly from yard work, no heavy lifting or bending Started on right side Now both sides A little better but still very bad No radiation into legs. Occasionally feels like it blooms to a larger area When lifting the right leg up to make a motion like putting pants on will reach a point that she cannot move any further No saddle symptoms She does reports that shortly before the pain started she was working in the yard pulling weeds.  The entire time she was working she was seated however she does endorse twisting motions.  She did not notice any pain at that time. She does tell me that she may have had a few twinges of pain prior to the sudden, intense onset. She denies fever, chills, rash, muscle aches, paresthesias, dizziness, sensation changes.  PMH, Medications, and Allergies reviewed and updated in chart.   ROS negative except for what is listed in HPI. Objective:  BP 122/82   Pulse 96   Ht '5\' 4"'$  (1.626 m)   Wt 132 lb (59.9 kg)   LMP 07/28/2008   SpO2 92%   BMI 22.66 kg/m  Physical Exam Vitals and nursing note reviewed.  Constitutional:      Appearance: Normal appearance.  HENT:     Head: Normocephalic.  Eyes:     Extraocular Movements: Extraocular movements intact.     Conjunctiva/sclera: Conjunctivae normal.     Pupils: Pupils are equal, round, and reactive to light.  Cardiovascular:     Rate and Rhythm: Normal rate and regular rhythm.     Pulses: Normal pulses.     Heart sounds: Normal heart sounds.  Pulmonary:     Effort: Pulmonary effort is normal.     Breath sounds: Normal breath sounds.  Musculoskeletal:        General: Tenderness present.     Cervical back: Normal range of  motion. No rigidity or tenderness.     Thoracic back: Normal.     Lumbar back: Spasms and tenderness present. No swelling, edema or deformity. Decreased range of motion. Negative right straight leg raise test and negative left straight leg raise test.       Back:     Right lower leg: No edema.     Left lower leg: No edema.  Skin:    General: Skin is warm and dry.     Capillary Refill: Capillary refill takes less than 2 seconds.  Neurological:     General: No focal deficit present.     Mental Status: She is alert and oriented to person, place, and time.     Sensory: No sensory deficit.     Motor: No weakness.     Gait: Gait abnormal.  Psychiatric:        Mood and Affect: Mood normal.        Behavior: Behavior normal.        Thought Content: Thought content normal.        Judgment: Judgment normal.           Assessment & Plan:   Problem List Items Addressed This Visit     Lumbar radicular pain - Primary    Lumbar radicular pain with unknown etiology.  There is mild weakness and decreased range of motion present.  No specific spasming of the muscles noted. Given the patient's severity of symptoms and length of time symptoms have been present do feel that imaging is warranted at this point.  We will obtain x-rays of the lumbar spine to determine if there are any visible causes of her pain.  We will also start steroid burst to see if we can get the pain level under control. No alarm symptoms present at this time. We will determine follow-up based on imaging and patient's response to steroids.      Relevant Medications   predniSONE (DELTASONE) 20 MG tablet   Other Relevant Orders   DG Lumbar Spine 2-3 Views     Orma Render, DNP, AGNP-c 01/03/2022  7:56 AM

## 2022-01-03 DIAGNOSIS — M5416 Radiculopathy, lumbar region: Secondary | ICD-10-CM | POA: Insufficient documentation

## 2022-01-03 NOTE — Assessment & Plan Note (Signed)
Lumbar radicular pain with unknown etiology.  There is mild weakness and decreased range of motion present.  No specific spasming of the muscles noted. Given the patient's severity of symptoms and length of time symptoms have been present do feel that imaging is warranted at this point.  We will obtain x-rays of the lumbar spine to determine if there are any visible causes of her pain.  We will also start steroid burst to see if we can get the pain level under control. No alarm symptoms present at this time. We will determine follow-up based on imaging and patient's response to steroids.

## 2022-01-07 ENCOUNTER — Telehealth: Payer: Self-pay | Admitting: *Deleted

## 2022-01-07 DIAGNOSIS — H02112 Cicatricial ectropion of right lower eyelid: Secondary | ICD-10-CM | POA: Insufficient documentation

## 2022-01-07 DIAGNOSIS — Z1211 Encounter for screening for malignant neoplasm of colon: Secondary | ICD-10-CM

## 2022-01-07 DIAGNOSIS — H02831 Dermatochalasis of right upper eyelid: Secondary | ICD-10-CM | POA: Insufficient documentation

## 2022-01-07 NOTE — Telephone Encounter (Signed)
Patient left voicemail on triage nurse line stating she needs a referral for colonoscopy.   Routing to provider to review and advise.

## 2022-01-07 NOTE — Telephone Encounter (Signed)
Please ask where she would like the referral placed. If she doesn't have a preference, please set her up with Dr Carlean Purl at [facility].

## 2022-01-08 ENCOUNTER — Telehealth (HOSPITAL_BASED_OUTPATIENT_CLINIC_OR_DEPARTMENT_OTHER): Payer: Self-pay

## 2022-01-08 ENCOUNTER — Other Ambulatory Visit (HOSPITAL_BASED_OUTPATIENT_CLINIC_OR_DEPARTMENT_OTHER): Payer: Self-pay

## 2022-01-08 NOTE — Telephone Encounter (Signed)
Referral placed at [facility] GI number given to patient to call and schedule.

## 2022-01-08 NOTE — Telephone Encounter (Signed)
Patient called and wanted PT to be done on her back @ [facility], I faxed all her information to [facility] at [phone]. Patient is aware that referral was sent and they will call her to schedule.

## 2022-02-11 ENCOUNTER — Other Ambulatory Visit: Payer: Self-pay | Admitting: Obstetrics and Gynecology

## 2022-02-11 DIAGNOSIS — Z79890 Hormone replacement therapy (postmenopausal): Secondary | ICD-10-CM

## 2022-02-25 ENCOUNTER — Telehealth: Payer: Self-pay | Admitting: *Deleted

## 2022-02-25 NOTE — Telephone Encounter (Signed)
Patient informed. 

## 2022-02-25 NOTE — Telephone Encounter (Signed)
Patient called stated on climara 0.025 mg patch in June. She reports reading an article that mentioned HRT can cause memory lost? She wanted to know you your thoughts about this. Please advise

## 2022-02-25 NOTE — Telephone Encounter (Signed)
Please let the patient know that studies have been mixed. Some studies show some preservation of cognitive function, while others show cognitive decline on estrogen. Other studies have shown a benefit with estrogen early in menopause, while others have not.  All medications have risks and benefits. She has to determine if the benefits outweigh the risks for her. The biggest risks of estrogen are blood clot, stroke, heart attack and breast cancer.  I think more data is needed to have a better answer in regards to cognitive function. I certainly wouldn't recommend being on it to improve cognitive function, there isn't enough data to support that.

## 2022-04-04 ENCOUNTER — Encounter: Payer: Self-pay | Admitting: Gastroenterology

## 2022-04-25 ENCOUNTER — Ambulatory Visit (AMBULATORY_SURGERY_CENTER): Payer: Self-pay | Admitting: *Deleted

## 2022-04-25 VITALS — Ht 64.0 in | Wt 129.0 lb

## 2022-04-25 DIAGNOSIS — Z1211 Encounter for screening for malignant neoplasm of colon: Secondary | ICD-10-CM

## 2022-04-25 MED ORDER — PEG 3350-KCL-NA BICARB-NACL 420 G PO SOLR: 4000.0000 mL | Freq: Once | ORAL | 0 refills | Status: AC

## 2022-04-25 NOTE — Progress Notes (Signed)
No egg or soy allergy known to patient  Hx of PONV when younger, none since then Patient denies ever being told they had issues or difficulty with intubation  No FH of Malignant Hyperthermia Pt is not on diet pills Pt is not on  home 02  Pt is not on blood thinners  Pt denies issues with constipation

## 2022-05-09 ENCOUNTER — Encounter: Payer: Self-pay | Admitting: Gastroenterology

## 2022-05-19 ENCOUNTER — Ambulatory Visit (AMBULATORY_SURGERY_CENTER): Payer: Medicare Other | Admitting: Gastroenterology

## 2022-05-19 ENCOUNTER — Encounter: Payer: Self-pay | Admitting: Gastroenterology

## 2022-05-19 VITALS — BP 128/66 | HR 60 | Temp 96.0°F | Resp 9 | Ht 64.0 in | Wt 129.0 lb

## 2022-05-19 DIAGNOSIS — Z1211 Encounter for screening for malignant neoplasm of colon: Secondary | ICD-10-CM

## 2022-05-19 DIAGNOSIS — D128 Benign neoplasm of rectum: Secondary | ICD-10-CM

## 2022-05-19 DIAGNOSIS — K621 Rectal polyp: Secondary | ICD-10-CM

## 2022-05-19 MED ORDER — SODIUM CHLORIDE 0.9 % IV SOLN: 500.0000 mL | Freq: Once | INTRAVENOUS | Status: DC

## 2022-05-19 NOTE — Op Note (Addendum)
[facility] Patient Name: Kendra Hoffman Procedure Date: 05/19/2022 11:40 AM MRN: [id number] Endoscopist: Mauri Pole , MD Age: 66 Referring MD:  Date of Birth: Apr 13, 1956 Gender: Female Account #: [id number] Procedure:                Colonoscopy Indications:              Screening for colorectal malignant neoplasm Medicines:                Monitored Anesthesia Care Procedure:                Pre-Anesthesia Assessment:                           - Prior to the procedure, a History and Physical                            was performed, and patient medications and                            allergies were reviewed. The patient's tolerance of                            previous anesthesia was also reviewed. The risks                            and benefits of the procedure and the sedation                            options and risks were discussed with the patient.                            All questions were answered, and informed consent                            was obtained. Prior Anticoagulants: The patient has                            taken no previous anticoagulant or antiplatelet                            agents. ASA Grade Assessment: II - A patient with                            mild systemic disease. After reviewing the risks                            and benefits, the patient was deemed in                            satisfactory condition to undergo the procedure.                           After obtaining informed consent, the colonoscope  was passed under direct vision. Throughout the                            procedure, the patient's blood pressure, pulse, and                            oxygen saturations were monitored continuously. The                            Olympus PCF-H190DL [id number]) Colonoscope was                            introduced through the anus and advanced to the the                            cecum,  identified by appendiceal orifice and                            ileocecal valve. The colonoscopy was performed                            without difficulty. The patient tolerated the                            procedure well. The quality of the bowel                            preparation was good. The ileocecal valve,                            appendiceal orifice, and rectum were photographed. Scope In: 11:45:54 AM Scope Out: 12:01:44 PM Scope Withdrawal Time: 0 hours 10 minutes 45 seconds  Total Procedure Duration: 0 hours 15 minutes 50 seconds  Findings:                 The perianal and digital rectal examinations were                            normal.                           A 2 mm polyp was found in the rectum. The polyp was                            sessile. The polyp was removed with a cold snare.                            Resection and retrieval were complete.                           Scattered small and large-mouthed diverticula were                            found in the sigmoid colon.  Non-bleeding external and internal hemorrhoids were                            found during retroflexion. The hemorrhoids were                            medium-sized.                           The exam was otherwise without abnormality. Complications:            No immediate complications. Estimated Blood Loss:     Estimated blood loss was minimal. Impression:               - One 2 mm polyp in the rectum, removed with a cold                            snare. Resected and retrieved.                           - Diverticulosis in the sigmoid colon.                           - Non-bleeding external and internal hemorrhoids.                           - The examination was otherwise normal. Recommendation:           - Patient has a contact number available for                            emergencies. The signs and symptoms of potential                             delayed complications were discussed with the                            patient. Return to normal activities tomorrow.                            Written discharge instructions were provided to the                            patient.                           - Resume previous diet.                           - Continue present medications.                           - Await pathology results.                           - Repeat colonoscopy in 5-10 years for surveillance  based on pathology results. Mauri Pole, MD 05/19/2022 12:07:35 PM This report has been signed electronically.

## 2022-05-19 NOTE — Progress Notes (Signed)
Pt awake, alert and oriented. VSS. Airway intact. SBAR complete to RN. All questions answered.  

## 2022-05-19 NOTE — Progress Notes (Signed)
[facility] History and Physical   Primary Care Physician:  Donnetta Hutching Coralee Pesa, NP   Reason for Procedure:  Colorectal cancer screening  Plan:    Screening colonoscopy with possible interventions as needed     HPI: Kendra Hoffman is a very pleasant 66 y.o. female here for screening colonoscopy. Denies any nausea, vomiting, abdominal pain, melena or bright red blood per rectum  The risks and benefits as well as alternatives of endoscopic procedure(s) have been discussed and reviewed. All questions answered. The patient agrees to proceed.    Past Medical History:  Diagnosis Date   Amenorrhea    Anemia    history of at age 86   Arthritis    bilateral hands   Breast nodule 09/2006   no mass, patient denies being told about this issue 09/18/2016   [facility])    melanoma   Cataract    Depression    "situational, lost 3 sibling and parents in short period of time"   Hx of melanoma of skin 2011   left foot    Mood disorder ([place])    PONV (postoperative nausea and vomiting)     Past Surgical History:  Procedure Laterality Date   BUNIONECTOMY     right   CERVICAL SPINE SURGERY     COLONOSCOPY     DILATATION & CURETTAGE/HYSTEROSCOPY WITH MYOSURE N/A 09/22/2016   Procedure: DILATATION & CURETTAGE/HYSTEROSCOPY WITH MYOSURE (possible Myosure);  Surgeon: Salvadore Dom, MD;  Location: [facility];  Service: Gynecology;  Laterality: N/A;  follow first case   EYE SURGERY     LAPAROSCOPIC BILATERAL SALPINGO OOPHERECTOMY Bilateral 09/04/2015   Procedure: LAPAROSCOPIC BILATERAL SALPINGO OOPHORECTOMY with pelvic washings;  Surgeon: Salvadore Dom, MD;  Location: [facility];  Service: Gynecology;  Laterality: Bilateral;   MELANOMA EXCISION  07/28/2010   left foot    OVARIAN CYST REMOVAL     TUMOR REMOVAL      Prior to Admission medications   Medication Sig Start Date End Date Taking? Authorizing Provider  aspirin EC 81 MG tablet Take 81 mg by mouth every evening.     Yes [provider]  cetirizine (ZYRTEC) 10 MG tablet Take by mouth.   Yes [provider]  citalopram (CELEXA) 20 MG tablet TAKE ONE TABLET BY MOUTH DAILY 12/27/21  Yes Salvadore Dom, MD  estradiol (CLIMARA - DOSED IN MG/24 HR) 0.025 mg/24hr patch PLACE 1 PATCH ONTO THE SKIN ONCE A WEEK AS DIRECTED 12/27/21  Yes Salvadore Dom, MD  medroxyPROGESTERone (PROVERA) 2.5 MG tablet Take 1 tablet (2.5 mg total) by mouth daily. 12/27/21  Yes Salvadore Dom, MD  Multiple Vitamin (MULTI-VITAMIN) tablet Take 1 tablet by mouth daily.   Yes [provider]    Current Outpatient Medications  Medication Sig Dispense Refill   aspirin EC 81 MG tablet Take 81 mg by mouth every evening.      cetirizine (ZYRTEC) 10 MG tablet Take by mouth.     citalopram (CELEXA) 20 MG tablet TAKE ONE TABLET BY MOUTH DAILY 90 tablet 3   estradiol (CLIMARA - DOSED IN MG/24 HR) 0.025 mg/24hr patch PLACE 1 PATCH ONTO THE SKIN ONCE A WEEK AS DIRECTED 12 patch 3   medroxyPROGESTERone (PROVERA) 2.5 MG tablet Take 1 tablet (2.5 mg total) by mouth daily. 90 tablet 3   Multiple Vitamin (MULTI-VITAMIN) tablet Take 1 tablet by mouth daily.     Current Facility-Administered Medications  Medication Dose Route Frequency Provider Last Rate  Last Admin   0.9 %  sodium chloride infusion  500 mL Intravenous Once Mauri Pole, MD        Allergies as of 05/19/2022 - Review Complete 05/19/2022  Allergen Reaction Noted   Alpha-gal  06/06/2020   Bactrim [sulfamethoxazole-trimethoprim] Rash 05/10/2013   Sulfamethoxazole Rash 09/16/2006    Family History  Problem Relation Age of Onset   Macular degeneration Mother    Hypertension Mother    Dementia Mother    Atrial fibrillation Mother    Hyperlipidemia Mother    Arthritis Mother    Depression Mother    Hearing loss Mother    Heart disease Mother    96 / [place] Mother    Diabetes Father    Heart attack Father        x 2    Heart disease Father    Arthritis Father        mostly in back; shoulder   Hyperlipidemia Father    Hypertension Father    Kidney disease Father    Depression Father    Hearing loss Father    Renal Disease Father    Alcohol abuse Sister    Arthritis Sister    Depression Sister    Drug abuse Sister    Hearing loss Sister    Hypertension Sister    Learning disabilities Sister    Other Sister        blood disorder - not hemochromotosis but similar requiring regular phlebotomy   Ovarian cancer Sister 79   Cancer Sister        ovarian   Early death Sister    Alcohol abuse Brother    Deep vein thrombosis Brother        disable and immoble also nephew   Arthritis Brother    Drug abuse Brother    Early death Brother    Hypertension Brother    Heart attack Maternal Grandmother    Early death Maternal Grandmother    Colon cancer Maternal Grandfather 77   Cancer Maternal Grandfather    Early death Maternal Grandfather    Diabetes Paternal Grandmother    Heart attack Paternal Grandmother 55   Early death Paternal Grandmother    Hearing loss Paternal Grandmother    Heart disease Paternal Grandmother    Hypertension Paternal Grandmother    Throat cancer Paternal Grandfather        smoker   Cancer Paternal Grandfather    Depression Paternal Grandfather    Early death Paternal Grandfather    Lung cancer Other    Colon cancer Other    Esophageal cancer Neg Hx    Stomach cancer Neg Hx    Rectal cancer Neg Hx     Social History   Socioeconomic History   Marital status: Single    Spouse name: Not on file   Number of children: Not on file   Years of education: Not on file   Highest education level: Not on file  Occupational History   Not on file  Tobacco Use   Smoking status: Never   Smokeless tobacco: Never  Vaping Use   Vaping Use: Never used  Substance and Sexual Activity   Alcohol use: Yes    Alcohol/week: 5.0 standard drinks of alcohol    Types: 5 Glasses of wine  per week   Drug use: No   Sexual activity: Not Currently    Partners: Male    Birth control/protection: Post-menopausal    Comment: BSO  Other  Topics Concern   Not on file  Social History Narrative   Single   HH of 1   Pets 1 Cat   Neg ets firearms wears seat belts has smoke alarm.    G1    2 jobs       Had smoked a few time per year  And stopped totally for 3 years.    Sister d  ovarian cancer/ parents ailing declining they live in [place] she is to travel back and forth. Sr. alcoholic   Occasional social alcohol   Down to 40 hours             Social Determinants of Health   Financial Resource Strain: Low Risk  (10/24/2021)   Overall Financial Resource Strain (CARDIA)    Difficulty of Paying Living Expenses: Not hard at all  Food Insecurity: No Food Insecurity (10/24/2021)   Hunger Vital Sign    Worried About Running Out of Food in the Last Year: Never true    Ran Out of Food in the Last Year: Never true  Transportation Needs: Unknown (10/24/2021)   PRAPARE - Hydrologist (Medical): Not on file    Lack of Transportation (Non-Medical): No  Physical Activity: Inactive (10/24/2021)   Exercise Vital Sign    Days of Exercise per Week: 0 days    Minutes of Exercise per Session: 0 min  Stress: Unknown (10/24/2021)   [place]    Feeling of Stress : Patient refused  Social Connections: Socially Isolated (10/24/2021)   Social Connection and Isolation Panel [NHANES]    Frequency of Communication with Friends and Family: More than three times a week    Frequency of Social Gatherings with Friends and Family: More than three times a week    Attends Religious Services: Never    Marine scientist or Organizations: No    Attends Archivist Meetings: Never    Marital Status: Never married  Intimate Partner Violence: Not At Risk (10/24/2021)   Humiliation, Afraid, Rape, and Kick  questionnaire    Fear of Current or Ex-Partner: No    Emotionally Abused: No    Physically Abused: No    Sexually Abused: No    Review of Systems:  All other review of systems negative except as mentioned in the HPI.  Physical Exam: Vital signs in last 24 hours: Blood Pressure 122/77   Pulse 65   Temperature (Abnormal) 96 F (35.6 C) (Skin)   Height '5\' 4"'$  (1.626 m)   Weight 129 lb (58.5 kg)   Last Menstrual Period 07/28/2008   Oxygen Saturation 97%   Body Mass Index 22.14 kg/m  General:   Alert, NAD Lungs:  Clear .   Heart:  Regular rate and rhythm Abdomen:  Soft, nontender and nondistended. Neuro/Psych:  Alert and cooperative. Normal mood and affect. A and O x 3  Reviewed labs, radiology imaging, old records and pertinent past GI work up  Patient is appropriate for planned procedure(s) and anesthesia in an ambulatory setting   K. Denzil Magnuson , MD [phone]

## 2022-05-19 NOTE — Progress Notes (Signed)
Pt's states no medical or surgical changes since previsit or office visit. VS assessed by D.T 

## 2022-05-19 NOTE — Patient Instructions (Signed)
Handouts given for polyps and diverticulosis.  YOU HAD AN ENDOSCOPIC PROCEDURE TODAY AT THE [facility]:   Refer to the procedure report that was given to you for any specific questions about what was found during the examination.  If the procedure report does not answer your questions, please call your gastroenterologist to clarify.  If you requested that your care partner not be given the details of your procedure findings, then the procedure report has been included in a sealed envelope for you to review at your convenience later.  YOU SHOULD EXPECT: Some feelings of bloating in the abdomen. Passage of more gas than usual.  Walking can help get rid of the air that was put into your GI tract during the procedure and reduce the bloating. If you had a lower endoscopy (such as a colonoscopy or flexible sigmoidoscopy) you may notice spotting of blood in your stool or on the toilet paper. If you underwent a bowel prep for your procedure, you may not have a normal bowel movement for a few days.  Please Note:  You might notice some irritation and congestion in your nose or some drainage.  This is from the oxygen used during your procedure.  There is no need for concern and it should clear up in a day or so.  SYMPTOMS TO REPORT IMMEDIATELY:  Following lower endoscopy (colonoscopy):  Excessive amounts of blood in the stool  Significant tenderness or worsening of abdominal pains  Swelling of the abdomen that is new, acute  Fever of 100F or higher  For urgent or emergent issues, a gastroenterologist can be reached at any hour by calling [phone]. Do not use MyChart messaging for urgent concerns.    DIET:  We do recommend a small meal at first, but then you may proceed to your regular diet.  Drink plenty of fluids but you should avoid alcoholic beverages for 24 hours.  ACTIVITY:  You should plan to take it easy for the rest of today and you should NOT DRIVE or use heavy machinery  until tomorrow (because of the sedation medicines used during the test).    FOLLOW UP: Our staff will call the number listed on your records the next business day following your procedure.  We will call around 7:15- 8:00 am to check on you and address any questions or concerns that you may have regarding the information given to you following your procedure. If we do not reach you, we will leave a message.     If any biopsies were taken you will be contacted by phone or by letter within the next 1-3 weeks.  Please call us at [phone] if you have not heard about the biopsies in 3 weeks.    SIGNATURES/CONFIDENTIALITY: You and/or your care partner have signed paperwork which will be entered into your electronic medical record.  These signatures attest to the fact that that the information above on your After Visit Summary has been reviewed and is understood.  Full responsibility of the confidentiality of this discharge information lies with you and/or your care-partner. 

## 2022-05-19 NOTE — Progress Notes (Signed)
Called to room to assist during endoscopic procedure.  Patient ID and intended procedure confirmed with present staff. Received instructions for my participation in the procedure from the performing physician.Called to room to assist during endoscopic procedure.  Patient ID and intended procedure confirmed with present staff. Received instructions for my participation in the procedure from the performing physician. 

## 2022-05-20 ENCOUNTER — Telehealth: Payer: Self-pay

## 2022-05-20 NOTE — Telephone Encounter (Signed)
  Follow up Call-     05/19/2022   11:00 AM  Call back number  Post procedure Call Back phone  # [phone]  Permission to leave phone message Yes    Follow up call made.  NALM

## 2022-06-01 ENCOUNTER — Encounter: Payer: Self-pay | Admitting: Gastroenterology

## 2022-07-10 NOTE — Telephone Encounter (Signed)
Encounter closed. Colonoscopy done on 05-19-22.

## 2022-07-30 DIAGNOSIS — Z1231 Encounter for screening mammogram for malignant neoplasm of breast: Secondary | ICD-10-CM | POA: Diagnosis not present

## 2022-07-31 ENCOUNTER — Encounter: Payer: Self-pay | Admitting: Obstetrics and Gynecology

## 2022-08-13 DIAGNOSIS — M4802 Spinal stenosis, cervical region: Secondary | ICD-10-CM | POA: Diagnosis not present

## 2022-08-13 DIAGNOSIS — M4322 Fusion of spine, cervical region: Secondary | ICD-10-CM | POA: Diagnosis not present

## 2022-08-27 ENCOUNTER — Encounter: Payer: Self-pay | Admitting: Family Medicine

## 2022-08-27 ENCOUNTER — Ambulatory Visit (INDEPENDENT_AMBULATORY_CARE_PROVIDER_SITE_OTHER): Payer: Medicare HMO | Admitting: Family Medicine

## 2022-08-27 VITALS — BP 122/72 | HR 69 | Temp 97.2°F | Ht 64.0 in | Wt 135.2 lb

## 2022-08-27 DIAGNOSIS — Z1159 Encounter for screening for other viral diseases: Secondary | ICD-10-CM | POA: Diagnosis not present

## 2022-08-27 DIAGNOSIS — Z0000 Encounter for general adult medical examination without abnormal findings: Secondary | ICD-10-CM

## 2022-08-27 DIAGNOSIS — R7303 Prediabetes: Secondary | ICD-10-CM

## 2022-08-27 DIAGNOSIS — R0789 Other chest pain: Secondary | ICD-10-CM | POA: Diagnosis not present

## 2022-08-27 DIAGNOSIS — Z23 Encounter for immunization: Secondary | ICD-10-CM | POA: Diagnosis not present

## 2022-08-27 DIAGNOSIS — E7800 Pure hypercholesterolemia, unspecified: Secondary | ICD-10-CM | POA: Diagnosis not present

## 2022-08-27 NOTE — Patient Instructions (Addendum)
Welcome to [facility] [place]! It was a pleasure meeting you today. ° °As discussed, Please schedule a 12 month follow up visit today. ° °PLEASE NOTE: ° °If you had any LAB tests please let us know if you have not heard back within a few days. You may see your results on MyChart before we have a chance to review them but we will give you a call once they are reviewed by us. If we ordered any REFERRALS today, please let us know if you have not heard from their office within the next week.  °Let us know through MyChart if you are needing REFILLS, or have your pharmacy send us the request. You can also use MyChart to communicate with me or any office staff. ° °Please try these tips to maintain a healthy lifestyle: ° °Eat most of your calories during the day when you are active. Eliminate processed foods including packaged sweets (pies, cakes, cookies), reduce intake of potatoes, white bread, white pasta, and white rice. Look for whole grain options, oat flour or almond flour. ° °Each meal should contain half fruits/vegetables, one quarter protein, and one quarter carbs (no bigger than a computer mouse). ° °Cut down on sweet beverages. This includes juice, soda, and sweet tea. Also watch fruit intake, though this is a healthier sweet option, it still contains natural sugar! Limit to 3 servings daily. ° °Drink at least 1 glass of water with each meal and aim for at least 8 glasses per day ° °Exercise at least 150 minutes every week.   °

## 2022-08-27 NOTE — Progress Notes (Signed)
Phone [phone]   Subjective:   Patient is a 67 y.o. female presenting for annual physical.    Chief Complaint  Patient presents with   Annual Exam    Pt has history of heart diease    New pt annual Predm-A1C elevated and FH DM.  Not exercising regularly.  Not a lot of sugar.  Mediterranean diet.   Has Alpha gal.  See problem oriented charting- ROS- ROS: Gen: no fever, chills  Skin: no rash, itching ENT: chronic runny nose-allergies.  Eyes: no blurry vision, +double vision-since eye surgery-benign-seeing ophth. -cavernous hemangioma. Resp: no cough, wheeze,SOB CV: palpitations, LE edema, +CP 5 wks ago, walking in store.center of chest.  Not sure if sob. No rad. Lasted <46mn. Not dizzy. Poss ate 2 hrs prior.  FH CAD.  GI: no heartburn, n/v/d/c, abd pain GU: no dysuria, urgency, frequency, hematuria.  Some stress urinary incont MSK: OA hands. Neuro: no dizziness, headache, weakness, vertigo Psych: no depression, anxiety, insomnia, SI   The following were reviewed and entered/updated in epic: Past Medical History:  Diagnosis Date   Allergic reaction to alpha-gal    Amenorrhea    Anemia    history of at age 67  Arthritis    bilateral hands   Breast nodule 09/2006   no mass, patient denies being told about this issue 09/18/2016   Cancer ([facility]    melanoma   Cataract    Depression    "situational, lost 3 sibling and parents in short period of time"   Hx of melanoma of skin 2011   left foot    Mood disorder ([place]    PONV (postoperative nausea and vomiting)    Patient Active Problem List   Diagnosis Date Noted   Lumbar radicular pain 01/03/2022   Hyperlipidemia 12/03/2021   Preventative health care 12/03/2021   History of iron deficiency 12/03/2021   Hx of melanoma of skin 08/29/2016   Medication management 02/07/2013   Visit for preventive health examination 11/18/2010   PERIMENOPAUSAL SYNDROME 12/21/2009   HAND PAIN, BILATERAL 12/21/2009   HSV 10/13/2007    Insomnia 10/13/2007   BACK PAIN 04/26/2007   Past Surgical History:  Procedure Laterality Date   BUNIONECTOMY     right   CERVICAL SPINE SURGERY     COLONOSCOPY     DILATATION & CURETTAGE/HYSTEROSCOPY WITH MYOSURE N/A 09/22/2016   Procedure: DILATATION & CURETTAGE/HYSTEROSCOPY WITH MYOSURE (possible Myosure);  Surgeon: JSalvadore Dom MD;  [facility];  Service: Gynecology;  Laterality: N/A;  follow first case   EYE SURGERY     LAPAROSCOPIC BILATERAL SALPINGO OOPHERECTOMY Bilateral 09/04/2015   Procedure: LAPAROSCOPIC BILATERAL SALPINGO OOPHORECTOMY with pelvic washings;  Surgeon: JSalvadore Dom MD;  [facility];  Service: Gynecology;  Laterality: Bilateral;   MELANOMA EXCISION  07/28/2010   left foot    OVARIAN CYST REMOVAL     TUMOR REMOVAL      Family History  Problem Relation Age of Onset   Macular degeneration Mother    Hypertension Mother    Dementia Mother    Atrial fibrillation Mother    Hyperlipidemia Mother    Arthritis Mother    Depression Mother    Hearing loss Mother    Heart disease Mother    Miscarriages / [place]Mother    Diabetes Father    Heart attack Father        x 2   Heart disease Father    Arthritis Father  mostly in back; shoulder   Hyperlipidemia Father    Hypertension Father    Kidney disease Father    Depression Father    Hearing loss Father    Renal Disease Father    Alcohol abuse Sister    Arthritis Sister    Depression Sister    Drug abuse Sister    Hearing loss Sister    Hypertension Sister    Learning disabilities Sister    Other Sister        blood disorder - not hemochromotosis but similar requiring regular phlebotomy   Ovarian cancer Sister 5   Cancer Sister        ovarian   Early death Sister    Alcohol abuse Brother    Deep vein thrombosis Brother        disable and immoble also nephew   Arthritis Brother    Drug abuse Brother    Early death Brother    Hypertension Brother    Heart  attack Maternal Grandmother    Early death Maternal Grandmother    Colon cancer Maternal Grandfather 35   Cancer Maternal Grandfather    Early death Maternal Grandfather    Diabetes Paternal Grandmother    Heart attack Paternal Grandmother 29   Early death Paternal Grandmother    Hearing loss Paternal Grandmother    Heart disease Paternal Grandmother    Hypertension Paternal Grandmother    Throat cancer Paternal Grandfather        smoker   Cancer Paternal Grandfather    Depression Paternal Grandfather    Early death Paternal Grandfather    Lung cancer Other    Colon cancer Other    Esophageal cancer Neg Hx    Stomach cancer Neg Hx    Rectal cancer Neg Hx     Medications- reviewed and updated Current Outpatient Medications  Medication Sig Dispense Refill   aspirin EC 81 MG tablet Take 81 mg by mouth every evening.      cetirizine (ZYRTEC) 10 MG tablet Take by mouth.     citalopram (CELEXA) 20 MG tablet TAKE ONE TABLET BY MOUTH DAILY 90 tablet 3   estradiol (CLIMARA - DOSED IN MG/24 HR) 0.025 mg/24hr patch PLACE 1 PATCH ONTO THE SKIN ONCE A WEEK AS DIRECTED 12 patch 3   medroxyPROGESTERone (PROVERA) 2.5 MG tablet Take 1 tablet (2.5 mg total) by mouth daily. 90 tablet 3   Multiple Vitamin (MULTI-VITAMIN) tablet Take 1 tablet by mouth daily.     No current facility-administered medications for this visit.    Allergies-reviewed and updated Allergies  Allergen Reactions   Alpha-Gal    Bactrim [Sulfamethoxazole-Trimethoprim] Rash   Sulfamethoxazole Rash    Social History   Social History Narrative   SingleHH of 1Pets 1 CatNeg ets firearms wears seat belts has smoke alarm. G1 2 jobs Had smoked a few time per year  And stopped totally for 3 years. Sister d  ovarian cancer/ parents ailing declining they live in [place] she is to travel back and forth. Sr. alcoholicOccasional social alcoholDown to 40 hours       Retired-property mgmt.  Cat and new puppy   Objective   Objective:  BP 122/72 (BP Location: Left Arm, Patient Position: Sitting, Cuff Size: Normal)   Pulse 69   Temp (!) 97.2 F (36.2 C) (Temporal)   Ht '5\' 4"'$  (1.626 m)   Wt 135 lb 3.2 oz (61.3 kg)   LMP 07/28/2008   SpO2 98%   BMI 23.21  kg/m  Physical Exam  Gen: WDWN NAD WF HEENT: NCAT, conjunctiva not injected, sclera nonicteric TM WNL B, OP moist, no exudates  NECK:  supple, no thyromegaly, no nodes, no carotid bruits CARDIAC: RRR, S1S2+, no murmur. DP 2+B LUNGS: CTAB. No wheezes ABDOMEN:  BS+, soft, NTND, No HSM, no masses EXT:  no edema MSK: no gross abnormalities. MS 5/5 all 4 NEURO: A&O x3.  CN II-XII intact.  PSYCH: normal mood. Good eye contact   EKG:NSR no st changes   Assessment and Plan   Health Maintenance counseling: 1. Anticipatory guidance: Patient counseled regarding regular dental exams q6 months, eye exams,  avoiding smoking and second hand smoke, limiting alcohol to 1 beverage per day, no illicit drugs.   2. Risk factor reduction:  Advised patient of need for regular exercise and diet rich and fruits and vegetables to reduce risk of heart attack and stroke. Exercise- some.  increase.  Wt Readings from Last 3 Encounters:  08/27/22 135 lb 3.2 oz (61.3 kg)  05/19/22 129 lb (58.5 kg)  04/25/22 129 lb (58.5 kg)   3. Immunizations/screenings/ancillary studies Immunization History  Administered Date(s) Administered   Fluad Quad(high Dose 65+) 08/27/2022   Influenza Inj Mdck Quad With Preservative 06/29/2018   Influenza, High Dose Seasonal PF 05/04/2021   Influenza, Seasonal, Injecte, Preservative Fre 04/27/2016   Influenza,inj,Quad PF,6+ Mos 06/06/2020   Influenza-Unspecified 04/27/2016   PFIZER(Purple Top)SARS-COV-2 Vaccination 10/10/2019, 11/10/2019   Td 07/28/1994   Tdap 11/18/2010   Health Maintenance Due  Topic Date Due   Hepatitis C Screening  Never done   Zoster Vaccines- Shingrix (1 of 2) Never done   COVID-19 Vaccine (3 - Pfizer risk series)  12/08/2019   DTaP/Tdap/Td (3 - Td or Tdap) 11/17/2020   Medicare Annual Wellness (AWV)  10/25/2022    4. Cervical cancer screening- utd 5. Breast cancer screening-  mammogram utd 6. Colon cancer screening - utd 7. Skin cancer screening- advised regular sunscreen use. Denies worrisome, changing, or new skin lesions.  8. Birth control/STD check- n/a 9. Osteoporosis screening- 2022 10. Smoking associated screening - non smoker  Problem List Items Addressed This Visit       Other   Hyperlipidemia   Relevant Orders   Comprehensive metabolic panel   Lipid panel   TSH   Other Visit Diagnoses     Wellness examination    -  Primary   Need for immunization against influenza       Relevant Orders   Flu Vaccine QUAD High Dose(Fluad) (Completed)   Other chest pain       Relevant Orders   EKG 12-Lead (Completed)   Ambulatory referral to Cardiology   Comprehensive metabolic panel   Lipid panel   TSH   CBC with Differential/Platelet   Prediabetes       Relevant Orders   Comprehensive metabolic panel   Hemoglobin A1c   TSH   CBC with Differential/Platelet   Encounter for hepatitis C screening test for low risk patient       Relevant Orders   Hepatitis C antibody      Annual-antic guidance-she will check on Tdap/shingrix.   Chest pain-new, atypical.  +FH CAD-refer card.  Check cbc,cmp,lipids PreDM-work on exercise-good diet.  Check A1C,cmp,tsh HLD-FH CAD-working on diet-get more exercise.  Check lipids/cmp/tsh  Recommended follow up: annualReturn in about 1 year (around 08/28/2023) for annual me.   AWV April w/Tina. Future Appointments  Date Time Provider [place]  10/28/2022  1:00 PM  [place] [facility]  12/31/2022  2:00 PM Salvadore Dom, MD [facility] None  08/31/2023  3:30 PM Tawnya Crook, MD [facility]    Lab/Order associations:non fasting   ICD-10-CM   1. Wellness examination  Z00.00     2. Need for immunization against influenza  Z23  Flu Vaccine QUAD High Dose(Fluad)    3. Other chest pain  R07.89 EKG 12-Lead    Ambulatory referral to Cardiology    Comprehensive metabolic panel    Lipid panel    TSH    CBC with Differential/Platelet    4. Prediabetes  R73.03 Comprehensive metabolic panel    Hemoglobin A1c    TSH    CBC with Differential/Platelet    5. Pure hypercholesterolemia  E78.00 Comprehensive metabolic panel    Lipid panel    TSH    6. Encounter for hepatitis C screening test for low risk patient  Z11.59 Hepatitis C antibody      No orders of the defined types were placed in this encounter.   Wellington Hampshire, MD

## 2022-08-28 LAB — CBC WITH DIFFERENTIAL/PLATELET
Basophils Absolute: 0 10*3/uL (ref 0.0–0.1)
Basophils Relative: 0.7 % (ref 0.0–3.0)
Eosinophils Absolute: 0.2 10*3/uL (ref 0.0–0.7)
Eosinophils Relative: 4.5 % (ref 0.0–5.0)
HCT: 42.2 % (ref 36.0–46.0)
Hemoglobin: 14.3 g/dL (ref 12.0–15.0)
Lymphocytes Relative: 37.4 % (ref 12.0–46.0)
Lymphs Abs: 2 10*3/uL (ref 0.7–4.0)
MCHC: 33.9 g/dL (ref 30.0–36.0)
MCV: 97.4 fl (ref 78.0–100.0)
Monocytes Absolute: 0.6 10*3/uL (ref 0.1–1.0)
Monocytes Relative: 12.1 % — ABNORMAL HIGH (ref 3.0–12.0)
Neutro Abs: 2.4 10*3/uL (ref 1.4–7.7)
Neutrophils Relative %: 45.3 % (ref 43.0–77.0)
Platelets: 239 10*3/uL (ref 150.0–400.0)
RBC: 4.33 Mil/uL (ref 3.87–5.11)
RDW: 12.9 % (ref 11.5–15.5)
WBC: 5.3 10*3/uL (ref 4.0–10.5)

## 2022-08-28 LAB — COMPREHENSIVE METABOLIC PANEL
ALT: 9 U/L (ref 0–35)
AST: 19 U/L (ref 0–37)
Albumin: 4.7 g/dL (ref 3.5–5.2)
Alkaline Phosphatase: 63 U/L (ref 39–117)
BUN: 12 mg/dL (ref 6–23)
CO2: 26 mEq/L (ref 19–32)
Calcium: 9.7 mg/dL (ref 8.4–10.5)
Chloride: 105 mEq/L (ref 96–112)
Creatinine, Ser: 0.81 mg/dL (ref 0.40–1.20)
GFR: 75.33 mL/min (ref >60.00)
Glucose, Bld: 87 mg/dL (ref 70–99)
Potassium: 4.2 mEq/L (ref 3.5–5.1)
Sodium: 140 mEq/L (ref 135–145)
Total Bilirubin: 0.5 mg/dL (ref 0.2–1.2)
Total Protein: 7 g/dL (ref 6.0–8.3)

## 2022-08-28 LAB — LIPID PANEL
Cholesterol: 218 mg/dL — ABNORMAL HIGH (ref 0–200)
HDL: 71 mg/dL (ref >39.00)
LDL Cholesterol: 123 mg/dL — ABNORMAL HIGH (ref 0–99)
NonHDL: 146.86
Total CHOL/HDL Ratio: 3
Triglycerides: 121 mg/dL (ref 0.0–149.0)
VLDL: 24.2 mg/dL (ref 0.0–40.0)

## 2022-08-28 LAB — HEMOGLOBIN A1C: Hgb A1c MFr Bld: 5.8 % (ref 4.6–6.5)

## 2022-08-28 LAB — HEPATITIS C ANTIBODY: Hepatitis C Ab: NONREACTIVE

## 2022-08-28 LAB — TSH: TSH: 1.83 u[IU]/mL (ref 0.35–5.50)

## 2022-08-28 NOTE — Progress Notes (Signed)
Labs ok except: 1.  A1C(3 month average of sugars) is elevated.  This is considered PreDiabetes.  Work on diet-decrease sugars and starches and aim for 30 minutes of exercise 5 days/week to prevent progression to diabetes  2.  Cholesterol ok.  Work on getting more exercise.  Await cardiology work up.they may or may not suggest meds

## 2022-08-29 ENCOUNTER — Telehealth: Payer: Self-pay | Admitting: Family Medicine

## 2022-08-29 NOTE — Telephone Encounter (Signed)
Patient returned call

## 2022-09-22 ENCOUNTER — Encounter (HOSPITAL_BASED_OUTPATIENT_CLINIC_OR_DEPARTMENT_OTHER): Payer: Self-pay | Admitting: Cardiology

## 2022-09-22 ENCOUNTER — Ambulatory Visit (HOSPITAL_BASED_OUTPATIENT_CLINIC_OR_DEPARTMENT_OTHER): Payer: Medicare HMO | Admitting: Cardiology

## 2022-09-22 VITALS — BP 118/60 | HR 72 | Ht 64.0 in | Wt 138.4 lb

## 2022-09-22 DIAGNOSIS — Z7189 Other specified counseling: Secondary | ICD-10-CM | POA: Diagnosis not present

## 2022-09-22 DIAGNOSIS — R079 Chest pain, unspecified: Secondary | ICD-10-CM

## 2022-09-22 DIAGNOSIS — Z8249 Family history of ischemic heart disease and other diseases of the circulatory system: Secondary | ICD-10-CM | POA: Diagnosis not present

## 2022-09-22 MED ORDER — METOPROLOL TARTRATE 25 MG PO TABS: ORAL_TABLET | ORAL | 0 refills | Status: DC

## 2022-09-22 NOTE — Progress Notes (Signed)
Cardiology Office Note:    Date:  09/22/2022   ID:  [place], DOB 1955-12-12, MRN [id number]  PCP:  Tawnya Crook, MD  Cardiologist:  Buford Dresser, MD  Referring MD: Tawnya Crook, MD   CC:  Chest pain  History of Present Illness:    Kendra Hoffman is a 67 y.o. female with a hx of hyperlipidemia, prediabetes, alpha-gal allergy, anemia, arthritis, melanoma, and depression, who is seen as a new consult at the request of Tawnya Crook, MD for the evaluation and management of chest pain.  She was seen by Dr. Cherlynn Kaiser 08/27/2022 and reported new, atypical chest pain. Noted to have a family history of CAD. She was referred to cardiology for further evaluation.  Chest pain: -Initial onset:  Six weeks ago, in the setting of walking around a store.  -Quality:  Mild, described as some pain but mostly a pressure.  -Frequency:  One isolated episode. Her chest pain had never occurred before this, and has not recurred. However she was concerned given her family history of heart disease. -Duration:  About 30 minutes. -Associated symptoms: Some shortness of breath. -Aggravating/alleviating factors: None -Prior cardiac history:  None -Prior ECG: 08/27/2022 shows NSR, no ST changes -Prior workup: Echocardiogram 12/2020, Carotid dopplers 11/2020 -Prior treatment:  None -Alcohol:  One glass a day with dinner. -Tobacco:  Never. -Comorbidities: Hyperlipidemia - Lipid panel 08/27/2022 showed LDL 123 (improved from 147), HDL 71, triglycerides 121. -Exercise level: "Lower than it should be." She walks her dog. Previously she walked more consistently. She denies feeling physically limited.  -Cardiac ROS:  She denies any palpitations, or peripheral edema. No headaches, syncope, orthopnea, or PND. -Family history:  CAD. Her father had 2 heart attacks, with CABG at 67 yo and a few years afterwards he had a stent placement, later had CHF. Her father had 5 brothers who all had  cardiovascular disease. Her mother developed Afib when she was 53 yo. -Diet: She does well with following a Mediterranean diet, chicken and fish. Doesn't eat meat due to alpha-gal allergy.  In the past she had one episode of transient global amnesia in the setting of recently attending her father's funeral. She was just starting to drive her car in the parking lot when it occurred.  She may become mildly lightheaded if she does not monitor her blood sugars.    Past Medical History:  Diagnosis Date   Allergic reaction to alpha-gal    Amenorrhea    Anemia    history of at age 54   Arthritis    bilateral hands   Breast nodule 09/2006   no mass, patient denies being told about this issue 09/18/2016   Cancer [facility])    melanoma   Cataract    Depression    "situational, lost 3 sibling and parents in short period of time"   Hx of melanoma of skin 2011   left foot    Mood disorder ([place])    PONV (postoperative nausea and vomiting)     Past Surgical History:  Procedure Laterality Date   BUNIONECTOMY     right   CERVICAL SPINE SURGERY     COLONOSCOPY     DILATATION & CURETTAGE/HYSTEROSCOPY WITH MYOSURE N/A 09/22/2016   Procedure: DILATATION & CURETTAGE/HYSTEROSCOPY WITH MYOSURE (possible Myosure);  Surgeon: Salvadore Dom, MD;  Location: [facility];  Service: Gynecology;  Laterality: N/A;  follow first case   EYE SURGERY     LAPAROSCOPIC BILATERAL SALPINGO OOPHERECTOMY Bilateral  09/04/2015   Procedure: LAPAROSCOPIC BILATERAL SALPINGO OOPHORECTOMY with pelvic washings;  Surgeon: Salvadore Dom, MD;  Location: [facility];  Service: Gynecology;  Laterality: Bilateral;   MELANOMA EXCISION  07/28/2010   left foot    OVARIAN CYST REMOVAL     TUMOR REMOVAL      Current Medications: Current Outpatient Medications on File Prior to Visit  Medication Sig   aspirin EC 81 MG tablet Take 81 mg by mouth every evening.    cetirizine (ZYRTEC) 10 MG tablet Take by mouth.   citalopram (CELEXA) 20  MG tablet TAKE ONE TABLET BY MOUTH DAILY   estradiol (CLIMARA - DOSED IN MG/24 HR) 0.025 mg/24hr patch PLACE 1 PATCH ONTO THE SKIN ONCE A WEEK AS DIRECTED   medroxyPROGESTERone (PROVERA) 2.5 MG tablet Take 1 tablet (2.5 mg total) by mouth daily.   Multiple Vitamin (MULTI-VITAMIN) tablet Take 1 tablet by mouth daily.   No current facility-administered medications on file prior to visit.     Allergies:   Alpha-gal, Bactrim [sulfamethoxazole-trimethoprim], and Sulfamethoxazole   Social History   Tobacco Use   Smoking status: Never   Smokeless tobacco: Never  Vaping Use   Vaping Use: Never used  Substance Use Topics   Alcohol use: Yes    Alcohol/week: 5.0 standard drinks of alcohol    Types: 5 Glasses of wine per week   Drug use: No    Family History: family history includes Alcohol abuse in her brother and sister; Arthritis in her brother, father, mother, and sister; Atrial fibrillation in her mother; Cancer in her maternal grandfather, paternal grandfather, and sister; Colon cancer in an other family member; Colon cancer (age of onset: 33) in her maternal grandfather; Deep vein thrombosis in her brother; Dementia in her mother; Depression in her father, mother, paternal grandfather, and sister; Diabetes in her father and paternal grandmother; Drug abuse in her brother and sister; Early death in her brother, maternal grandfather, maternal grandmother, paternal grandfather, paternal grandmother, and sister; Hearing loss in her father, mother, paternal grandmother, and sister; Heart attack in her father and maternal grandmother; Heart attack (age of onset: 71) in her paternal grandmother; Heart disease in her father, mother, and paternal grandmother; Hyperlipidemia in her father and mother; Hypertension in her brother, father, mother, paternal grandmother, and sister; Kidney disease in her father; Learning disabilities in her sister; Lung cancer in an other family member; Macular degeneration in  her mother; Miscarriages / Stillbirths in her mother; Other in her sister; Ovarian cancer (age of onset: 60) in her sister; Renal Disease in her father; Throat cancer in her paternal grandfather. There is no history of Esophageal cancer, Stomach cancer, or Rectal cancer.  ROS:   Please see the history of present illness.  Additional pertinent ROS: Constitutional: Negative for chills, fever, night sweats, unintentional weight loss  HENT: Negative for ear pain and hearing loss.   Eyes: Negative for loss of vision and eye pain.  Respiratory: Negative for cough, sputum, wheezing.   Cardiovascular: See HPI. Gastrointestinal: Negative for abdominal pain, melena, and hematochezia.  Genitourinary: Negative for dysuria and hematuria.  Musculoskeletal: Negative for falls and myalgias.  Skin: Negative for itching and rash.  Neurological: Negative for focal weakness, focal sensory changes and loss of consciousness.  Endo/Heme/Allergies: Does not bruise/bleed easily.     EKGs/Labs/Other Studies Reviewed:    The following studies were reviewed today:  CTA Head/Neck  10/12/2021: IMPRESSION: No emergent large vessel occlusion or high-grade stenosis of the intracranial or cervical  arteries.  Echo  01/02/2021:  1. Left ventricular ejection fraction, by estimation, is 60 to 65%. Left  ventricular ejection fraction by 3D volume is 63 %. The left ventricle has  normal function. The left ventricle has no regional wall motion  abnormalities. There is mild asymmetric  left ventricular hypertrophy of the basal-septal segment. Left ventricular  diastolic parameters were normal.   2. Right ventricular systolic function is normal. The right ventricular  size is normal. There is normal pulmonary artery systolic pressure. The  estimated right ventricular systolic pressure is [id number] mmHg.   3. The mitral valve is normal in structure. Trivial mitral valve  regurgitation.   4. The aortic valve is tricuspid. There is  mild thickening of the aortic  valve. Aortic valve regurgitation is not visualized. Mild aortic valve  sclerosis is present, with no evidence of aortic valve stenosis.   5. The inferior vena cava is normal in size with greater than 50%  respiratory variability, suggesting right atrial pressure of 3 mmHg.   Comparison(s): No prior Echocardiogram.   Bilateral Carotid Doppler  12/13/2020: Summary:  Right Carotid: Velocities in the right ICA are consistent with a 1-39%  stenosis.   Left Carotid: Velocities in the left ICA are consistent with a 1-39%  stenosis.   Vertebrals: Bilateral vertebral arteries demonstrate antegrade flow.    EKG:  EKG is personally reviewed. 09/22/2022:  EKG was not ordered. 08/27/2022 (Dr. Cherlynn Kaiser):  NSR no st changes.  Recent Labs: 08/27/2022: ALT 9; BUN 12; Creatinine, Ser 0.81; Hemoglobin 14.3; Platelets 239.0; Potassium 4.2; Sodium 140; TSH 1.83   Recent Lipid Panel    Component Value Date/Time   CHOL 218 (H) 08/27/2022 1610   CHOL 219 (H) 12/03/2021 1554   TRIG 121.0 08/27/2022 1610   HDL 71.00 08/27/2022 1610   HDL 67 12/03/2021 1554   CHOLHDL 3 08/27/2022 1610   VLDL 24.2 08/27/2022 1610   LDLCALC 123 (H) 08/27/2022 1610   LDLCALC 137 (H) 12/03/2021 1554   LDLDIRECT 104.0 02/04/2012 1048    Physical Exam:    VS:  BP 118/60 (BP Location: Left Arm, Patient Position: Sitting, Cuff Size: Normal)   Pulse 72   Ht '5\' 4"'$  (1.626 m)   Wt 138 lb 6.4 oz (62.8 kg)   LMP 07/28/2008   SpO2 95%   BMI 23.76 kg/m     Wt Readings from Last 3 Encounters:  09/22/22 138 lb 6.4 oz (62.8 kg)  08/27/22 135 lb 3.2 oz (61.3 kg)  05/19/22 129 lb (58.5 kg)    GEN: Well nourished, well developed in no acute distress HEENT: Normal, moist mucous membranes NECK: No JVD CARDIAC: regular rhythm, normal S1 and S2, no rubs or gallops. No murmur. VASCULAR: Radial and DP pulses 2+ bilaterally. No carotid bruits RESPIRATORY:  Clear to auscultation without rales, wheezing  or rhonchi  ABDOMEN: Soft, non-tender, non-distended MUSCULOSKELETAL:  Ambulates independently SKIN: Warm and dry, no edema NEUROLOGIC:  Alert and oriented x 3. No focal neuro deficits noted. PSYCHIATRIC:  Normal affect    ASSESSMENT:    1. Chest pain of uncertain etiology   2. Family history of heart disease   3. Cardiac risk counseling   4. Counseling on health promotion and disease prevention    PLAN:    Chest pain Family history of heart disease -discussed treadmill stress, nuclear stress/lexiscan, and CT coronary angiography. Discussed pros and cons of each, including but not limited to false positive/false negative risk, radiation risk, and risk of  IV contrast dye. Based on shared decision making, decision was made to pursue CT coronary angiography. -will give one time dose of metoprolol 2 hours prior to scheduled test -had normal BMET 08/27/22 -counseled on use of sublingual nitroglycerin and its importance to a good test -reviewed red flag warning signs that need immediate medical attention   Cardiac risk counseling and prevention recommendations: -recommend heart healthy/Mediterranean diet, with whole grains, fruits, vegetable, fish, lean meats, nuts, and olive oil. Limit salt. -recommend moderate walking, 3-5 times/week for 30-50 minutes each session. Aim for at least 150 minutes.week. Goal should be pace of 3 miles/hours, or walking 1.5 miles in 30 minutes -recommend avoidance of tobacco products. Avoid excess alcohol. -ASCVD risk score: The 10-year ASCVD risk score (Arnett DK, et al., 2019) is: 5.6%   Values used to calculate the score:     Age: 33 years     Sex: Female     Is Non-Hispanic African American: No     Diabetic: No     Tobacco smoker: No     Systolic Blood Pressure: 123456 mmHg     Is BP treated: No     HDL Cholesterol: 71 mg/dL     Total Cholesterol: 218 mg/dL    Plan for follow up: 6 weeks or sooner as needed, to review results of CT  Buford Dresser, MD, PhD, [facility]    Medication Adjustments/Labs and Tests Ordered: Current medicines are reviewed at length with the patient today.  Concerns regarding medicines are outlined above.   Orders Placed This Encounter  Procedures   CT CORONARY MORPH W/CTA COR W/SCORE W/CA W/CM &/OR WO/CM   Meds ordered this encounter  Medications   metoprolol tartrate (LOPRESSOR) 25 MG tablet    Sig: Take 1 tablet 2 hours prior to CT    Dispense:  1 tablet    Refill:  0   Patient Instructions  Medication Instructions:  TAKE METOPROLOL 25 MG 1 TABLET 2 HOURS PRIOR TO CT  *If you need a refill on your cardiac medications before your next appointment, please call your pharmacy*  Lab Work: NONE  Testing/Procedures: Your physician has requested that you have cardiac CT. Cardiac computed tomography (CT) is a painless test that uses an x-ray machine to take clear, detailed pictures of your heart. For further information please visit [email or web address]. Please follow instruction sheet as given.  Follow-Up: At [facility], you and your health needs are our priority.  As part of our continuing mission to provide you with exceptional heart care, we have created designated Provider Care Teams.  These Care Teams include your primary Cardiologist (physician) and Advanced Practice Providers (APPs -  Physician Assistants and Nurse Practitioners) who all work together to provide you with the care you need, when you need it.  We recommend signing up for the patient portal called "MyChart".  Sign up information is provided on this After Visit Summary.  MyChart is used to connect with patients for Virtual Visits (Telemedicine).  Patients are able to view lab/test results, encounter notes, upcoming appointments, etc.  Non-urgent messages can be sent to your provider as well.   To learn more about what you can do with MyChart, go to [email or web address].    Your next  appointment:   6 week(s)  Provider:   Buford Dresser, MD    Other Instructions   Your cardiac CT will be scheduled at one of the below locations:   [facility] [address], [place] [address] [phone]  [facility] [address] [place], [place] [address] [phone]  [facility] [place], [place] [address] [phone]  If scheduled at [facility], please arrive at the [facility] and Children's Entrance (Entrance C2) of [facility] 30 minutes prior to test start time. You can use the FREE valet parking offered at entrance C (encouraged to control the heart rate for the test)  Proceed to the [facility] Radiology Department (first floor) to check-in and test prep.  All radiology patients and guests should use entrance C2 at [facility], accessed from [facility], even though the hospital's physical address listed is [address].    If scheduled at [facility] or [facility], please arrive 15 mins early for check-in and test prep.   Please follow these instructions carefully (unless otherwise directed):  Hold all erectile dysfunction medications at least 3 days (72 hrs) prior to test. (Ie viagra, cialis, sildenafil, tadalafil, etc) We will administer nitroglycerin during this exam.   On the Night Before the Test: Be sure to Drink plenty of water. Do not consume any caffeinated/decaffeinated beverages or chocolate 12 hours prior to your test. Do not take any antihistamines 12 hours prior to your test. If the patient has contrast allergy: Patient will need a prescription for Prednisone and very clear instructions (as follows): Prednisone 50 mg - take 13 hours prior to test Take another Prednisone 50 mg 7 hours prior to test Take another Prednisone 50  mg 1 hour prior to test Take Benadryl 50 mg 1 hour prior to test Patient must complete all four doses of above prophylactic medications. Patient will need a ride after test due to Benadryl.  On the Day of the Test: Drink plenty of water until 1 hour prior to the test. Do not eat any food 1 hour prior to test. You may take your regular medications prior to the test.  Take metoprolol (Lopressor) two hours prior to test. If you take Furosemide/Hydrochlorothiazide/Spironolactone, please HOLD on the morning of the test. FEMALES- please wear underwire-free bra if available, avoid dresses & tight clothing  After the Test: Drink plenty of water. After receiving IV contrast, you may experience a mild flushed feeling. This is normal. On occasion, you may experience a mild rash up to 24 hours after the test. This is not dangerous. If this occurs, you can take Benadryl 25 mg and increase your fluid intake. If you experience trouble breathing, this can be serious. If it is severe call 911 IMMEDIATELY. If it is mild, please call our office. If you take any of these medications: Glipizide/Metformin, Avandament, Glucavance, please do not take 48 hours after completing test unless otherwise instructed.  We will call to schedule your test 2-4 weeks out understanding that some insurance companies will need an authorization prior to the service being performed.   For non-scheduling related questions, please contact the cardiac imaging nurse navigator should you have any questions/concerns: Marchia Bond, Cardiac Imaging Nurse Navigator Gordy Clement, Cardiac Imaging Nurse Navigator [facility] Direct Office Dial: [phone]   For scheduling needs, including cancellations and rescheduling, please call [place], [phone].  Cardiac CT Angiogram A cardiac CT angiogram is a procedure to look at the heart and the area around the heart. It may be done to help find the  cause of  chest pains or other symptoms of heart disease. During this procedure, a substance called contrast dye is injected into a vein in the arm. The contrast highlights the blood vessels in the area to be checked. A large X-ray machine (CT scanner), then takes detailed pictures of the heart and the surrounding area. The procedure is also sometimes called a coronary CT angiogram, coronary artery scanning, or CTA. A cardiac CT angiogram allows the health care provider to see how well blood is flowing to and from the heart. The provider will be able to see if there are any problems, such as: Blockage or narrowing of the arteries in the heart. Fluid around the heart. Signs of weakness or disease in the muscles, valves, and tissues of the heart. Tell a health care provider about: Any allergies you have. This is especially important if you have had a previous allergic reaction to medicines, contrast dye, or iodine. All medicines you are taking, including vitamins, herbs, eye drops, creams, and over-the-counter medicines. Any bleeding problems you have. Any surgeries you have had. Any medical conditions you have, including kidney problems or kidney failure. Whether you are pregnant or may be pregnant. Any anxiety disorders, chronic pain, or other conditions you have. These may increase your stress or prevent you from lying still. Any history of abnormal heart rhythms or heart procedures. What are the risks? Your provider will talk with you about risks. These may include: Bleeding. Infection. Allergic reactions to medicines or dyes. Damage to other structures or organs. Kidney damage from the contrast dye. Increased risk of cancer from radiation exposure. This risk is low. Talk with your provider about: The risks and benefits of testing. How you can receive the lowest dose of radiation. What happens before the procedure? Wear comfortable clothing and remove any jewelry, glasses, dentures, and hearing  aids. Follow instructions from your provider about eating and drinking. These may include: 12 hours before the procedure Avoid caffeine. This includes tea, coffee, soda, energy drinks, and diet pills. Drink plenty of water or other fluids that do not have caffeine in them. Being well hydrated can prevent complications. 4-6 hours before the procedure Stop eating and drinking. This will reduce the risk of nausea from the contrast dye. Ask your provider about changing or stopping your regular medicines. These include: Diabetes medicines. Medicines to treat problems with erections (erectile dysfunction). If you have kidney problems, you may need to receive IV hydration before and after the test. What happens during the procedure?  Hair on your chest may need to be removed so that small sticky patches called electrodes can be placed on your chest. These will transmit information that helps to monitor your heart during the procedure. An IV will be inserted into one of your veins. You might be given a medicine to control your heart rate during the procedure. This will help to ensure that good images are obtained. You will be asked to lie on an exam table. This table will slide in and out of the CT machine during the procedure. Contrast dye will be injected into the IV. You might feel warm, or you may get a metallic taste in your mouth. You may be given medicines to relax or dilate the arteries in your heart. If you are allergic to contrast dyes or iodine you may be given medicine before the test to reduce the risk of an allergic reaction. The table that you are lying on will move into the CT machine tunnel  for the scan. The person running the machine will give you instructions while the scans are being done. You may be asked to: Keep your arms above your head. Hold your breath for short periods. Stay very still, even if the table is moving. The procedure may vary among providers and  hospitals. What can I expect after the procedure? After your procedure, it is common to have: A metallic taste in your mouth from the contrast dye. A feeling of warmth. A headache from the heart medicine. Follow these instructions at home: Take over-the-counter and prescription medicines only as told by your provider. If you are told, drink enough fluid to keep your pee pale yellow. This will help to flush the contrast dye out of your body. Most people can return to their normal activities right after the procedure. Ask your provider what activities are safe for you. It is up to you to get the results of your procedure. Ask your provider, or the department that is doing the procedure, when your results will be ready. Contact a health care provider if: You have any symptoms of allergy to the contrast dye. These include: Shortness of breath. Rash or hives. A racing heartbeat. You notice a change in your peeing (urination). This information is not intended to replace advice given to you by your health care provider. Make sure you discuss any questions you have with your health care provider. Document Revised: 02/14/2022 Document Reviewed: 02/14/2022 Elsevier Patient Education  [place].     [employer] Stumpf,acting as a Education administrator for [employer], MD.,have documented all relevant documentation on the behalf of Buford Dresser, MD,as directed by  Buford Dresser, MD while in the presence of Buford Dresser, MD.  I, Buford Dresser, MD, have reviewed all documentation for this visit. The documentation on 09/22/22 for the exam, diagnosis, procedures, and orders are all accurate and complete.   Signed, Buford Dresser, MD PhD 09/22/2022 5:38 PM    [place]

## 2022-09-22 NOTE — Patient Instructions (Addendum)
Medication Instructions:  TAKE METOPROLOL 25 MG 1 TABLET 2 HOURS PRIOR TO CT  *If you need a refill on your cardiac medications before your next appointment, please call your pharmacy*  Lab Work: NONE  Testing/Procedures: Your physician has requested that you have cardiac CT. Cardiac computed tomography (CT) is a painless test that uses an x-ray machine to take clear, detailed pictures of your heart. For further information please visit [email or web address]. Please follow instruction sheet as given.  Follow-Up: At [facility], you and your health needs are our priority.  As part of our continuing mission to provide you with exceptional heart care, we have created designated Provider Care Teams.  These Care Teams include your primary Cardiologist (physician) and Advanced Practice Providers (APPs -  Physician Assistants and Nurse Practitioners) who all work together to provide you with the care you need, when you need it.  We recommend signing up for the patient portal called "MyChart".  Sign up information is provided on this After Visit Summary.  MyChart is used to connect with patients for Virtual Visits (Telemedicine).  Patients are able to view lab/test results, encounter notes, upcoming appointments, etc.  Non-urgent messages can be sent to your provider as well.   To learn more about what you can do with MyChart, go to [email or web address].    Your next appointment:   6 week(s)  Provider:   Buford Dresser, MD    Other Instructions   Your cardiac CT will be scheduled at one of the below locations:   [facility] [address] [place], [place] [address] [phone]  [place] [address] [place], [place] [address] [phone]  [facility] [place], [place] [address] [phone]  If scheduled at [facility], please arrive at the  [facility] Entrance (Entrance C2) of [facility] 30 minutes prior to test start time. You can use the FREE valet parking offered at entrance C (encouraged to control the heart rate for the test)  [facility]) to check-in and test prep.  All radiology patients and guests should use entrance C2 at [facility], accessed from [facility], even though the hospital's physical address listed is [address]    If scheduled at [facility] or [facility], please arrive 15 mins early for check-in and test prep.   Please follow these instructions carefully (unless otherwise directed):  Hold all erectile dysfunction medications at least 3 days (72 hrs) prior to test. (Ie viagra, cialis, sildenafil, tadalafil, etc) We will administer nitroglycerin during this exam.   On the Night Before the Test: Be sure to Drink plenty of water. Do not consume any caffeinated/decaffeinated beverages or chocolate 12 hours prior to your test. Do not take any antihistamines 12 hours prior to your test. If the patient has contrast allergy: Patient will need a prescription for Prednisone and very clear instructions (as follows): Prednisone 50 mg - take 13 hours prior to test Take another Prednisone 50 mg 7 hours prior to test Take another Prednisone 50 mg 1 hour prior to test Take Benadryl 50 mg 1 hour prior to test Patient must complete all four doses of above prophylactic medications. Patient will need a ride after test due to Benadryl.  On the Day of the Test: Drink plenty of water until 1 hour  prior to the test. Do not eat any food 1 hour prior to test. You may take your regular medications prior to the test.  Take metoprolol (Lopressor) two hours prior to test. If you take Furosemide/Hydrochlorothiazide/Spironolactone, please HOLD on the morning of the test. FEMALES-  please wear underwire-free bra if available, avoid dresses & tight clothing  After the Test: Drink plenty of water. After receiving IV contrast, you may experience a mild flushed feeling. This is normal. On occasion, you may experience a mild rash up to 24 hours after the test. This is not dangerous. If this occurs, you can take Benadryl 25 mg and increase your fluid intake. If you experience trouble breathing, this can be serious. If it is severe call 911 IMMEDIATELY. If it is mild, please call our office. If you take any of these medications: Glipizide/Metformin, Avandament, Glucavance, please do not take 48 hours after completing test unless otherwise instructed.  We will call to schedule your test 2-4 weeks out understanding that some insurance companies will need an authorization prior to the service being performed.   For non-scheduling related questions, please contact the cardiac imaging nurse navigator should you have any questions/concerns: Marchia Bond, Cardiac Imaging Nurse Navigator Gordy Clement, Cardiac Imaging Nurse Navigator  Heart and Vascular Services Direct Office Dial: [phone]   For scheduling needs, including cancellations and rescheduling, please call [place], [phone].  Cardiac CT Angiogram A cardiac CT angiogram is a procedure to look at the heart and the area around the heart. It may be done to help find the cause of chest pains or other symptoms of heart disease. During this procedure, a substance called contrast dye is injected into a vein in the arm. The contrast highlights the blood vessels in the area to be checked. A large X-ray machine (CT scanner), then takes detailed pictures of the heart and the surrounding area. The procedure is also sometimes called a coronary CT angiogram, coronary artery scanning, or CTA. A cardiac CT angiogram allows the health care provider to see how well blood is flowing to and from the heart. The provider will be  able to see if there are any problems, such as: Blockage or narrowing of the arteries in the heart. Fluid around the heart. Signs of weakness or disease in the muscles, valves, and tissues of the heart. Tell a health care provider about: Any allergies you have. This is especially important if you have had a previous allergic reaction to medicines, contrast dye, or iodine. All medicines you are taking, including vitamins, herbs, eye drops, creams, and over-the-counter medicines. Any bleeding problems you have. Any surgeries you have had. Any medical conditions you have, including kidney problems or kidney failure. Whether you are pregnant or may be pregnant. Any anxiety disorders, chronic pain, or other conditions you have. These may increase your stress or prevent you from lying still. Any history of abnormal heart rhythms or heart procedures. What are the risks? Your provider will talk with you about risks. These may include: Bleeding. Infection. Allergic reactions to medicines or dyes. Damage to other structures or organs. Kidney damage from the contrast dye. Increased risk of cancer from radiation exposure. This risk is low. Talk with your provider about: The risks and benefits of testing. How you can receive the lowest dose of radiation. What happens before the procedure? Wear comfortable clothing and remove any jewelry, glasses, dentures, and hearing aids. Follow instructions from your provider about eating and drinking. These may include: 12 hours before  the procedure Avoid caffeine. This includes tea, coffee, soda, energy drinks, and diet pills. Drink plenty of water or other fluids that do not have caffeine in them. Being well hydrated can prevent complications. 4-6 hours before the procedure Stop eating and drinking. This will reduce the risk of nausea from the contrast dye. Ask your provider about changing or stopping your regular medicines. These include: Diabetes  medicines. Medicines to treat problems with erections (erectile dysfunction). If you have kidney problems, you may need to receive IV hydration before and after the test. What happens during the procedure?  Hair on your chest may need to be removed so that small sticky patches called electrodes can be placed on your chest. These will transmit information that helps to monitor your heart during the procedure. An IV will be inserted into one of your veins. You might be given a medicine to control your heart rate during the procedure. This will help to ensure that good images are obtained. You will be asked to lie on an exam table. This table will slide in and out of the CT machine during the procedure. Contrast dye will be injected into the IV. You might feel warm, or you may get a metallic taste in your mouth. You may be given medicines to relax or dilate the arteries in your heart. If you are allergic to contrast dyes or iodine you may be given medicine before the test to reduce the risk of an allergic reaction. The table that you are lying on will move into the CT machine tunnel for the scan. The person running the machine will give you instructions while the scans are being done. You may be asked to: Keep your arms above your head. Hold your breath for short periods. Stay very still, even if the table is moving. The procedure may vary among providers and hospitals. What can I expect after the procedure? After your procedure, it is common to have: A metallic taste in your mouth from the contrast dye. A feeling of warmth. A headache from the heart medicine. Follow these instructions at home: Take over-the-counter and prescription medicines only as told by your provider. If you are told, drink enough fluid to keep your pee pale yellow. This will help to flush the contrast dye out of your body. Most people can return to their normal activities right after the procedure. Ask your provider what  activities are safe for you. It is up to you to get the results of your procedure. Ask your provider, or the department that is doing the procedure, when your results will be ready. Contact a health care provider if: You have any symptoms of allergy to the contrast dye. These include: Shortness of breath. Rash or hives. A racing heartbeat. You notice a change in your peeing (urination). This information is not intended to replace advice given to you by your health care provider. Make sure you discuss any questions you have with your health care provider. Document Revised: 02/14/2022 Document Reviewed: 02/14/2022 Elsevier Patient Education  [place].

## 2022-09-23 ENCOUNTER — Telehealth (HOSPITAL_BASED_OUTPATIENT_CLINIC_OR_DEPARTMENT_OTHER): Payer: Self-pay | Admitting: Cardiology

## 2022-09-23 NOTE — Telephone Encounter (Signed)
Received note from Dr. Salem Caster needs to be seen in 6 weeks not 6 months---left message for patient to call and schedule

## 2022-09-30 ENCOUNTER — Telehealth (HOSPITAL_COMMUNITY): Payer: Self-pay | Admitting: *Deleted

## 2022-09-30 NOTE — Telephone Encounter (Signed)
Attempted to call patient regarding upcoming cardiac CT appointment. °Left message on voicemail with name and callback number ° °Rayen Palen RN Navigator Cardiac Imaging °[facility] °[phone] Office °[phone] Cell ° °

## 2022-09-30 NOTE — Telephone Encounter (Signed)
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 Patient states she is able to tolerate cheese and diary without issues and feels comfortable taking NTG for test. She is to take '25mg'$  metoprolol tartrate two hours prior her cardiac CT scan and arrive at at 3pm.

## 2022-10-01 ENCOUNTER — Ambulatory Visit (HOSPITAL_COMMUNITY)
Admission: RE | Admit: 2022-10-01 | Discharge: 2022-10-01 | Disposition: A | Discharge status: Home / Self Care | Payer: Medicare HMO | Source: Ambulatory Visit | Attending: Cardiology | Admitting: Cardiology

## 2022-10-01 DIAGNOSIS — R7303 Prediabetes: Secondary | ICD-10-CM | POA: Diagnosis not present

## 2022-10-01 DIAGNOSIS — R072 Precordial pain: Secondary | ICD-10-CM | POA: Diagnosis not present

## 2022-10-01 DIAGNOSIS — R079 Chest pain, unspecified: Secondary | ICD-10-CM | POA: Diagnosis not present

## 2022-10-01 DIAGNOSIS — E785 Hyperlipidemia, unspecified: Secondary | ICD-10-CM | POA: Diagnosis not present

## 2022-10-01 MED ORDER — IOHEXOL 350 MG/ML SOLN
100.0000 mL | Freq: Once | INTRAVENOUS | Status: AC | PRN
  Administered 2022-10-01: 100 mL via INTRAVENOUS

## 2022-10-01 MED ORDER — NITROGLYCERIN 0.4 MG SL SUBL
0.8000 mg | SUBLINGUAL_TABLET | SUBLINGUAL | Status: DC | PRN
  Administered 2022-10-01: 0.8 mg via SUBLINGUAL

## 2022-10-14 ENCOUNTER — Telehealth (HOSPITAL_BASED_OUTPATIENT_CLINIC_OR_DEPARTMENT_OTHER): Payer: Self-pay | Admitting: *Deleted

## 2022-10-14 NOTE — Telephone Encounter (Signed)
-----   Message from Loel Dubonnet, NP sent at 10/14/2022  8:59 AM EDT ----- Coronary CTA with calcium score of 0. No evidence of heart disease. Good result!

## 2022-10-14 NOTE — Telephone Encounter (Signed)
Patient had originally wanted to follow up to review results but is ok with not following up if Dr Harrell Gave doesn't think she needs to be seen. Reached out to Dr Harrell Gave and patient can f/u prn but please call if anything needed Left message for patient and cancelled follow up

## 2022-10-27 DIAGNOSIS — H5212 Myopia, left eye: Secondary | ICD-10-CM | POA: Diagnosis not present

## 2022-10-27 DIAGNOSIS — H524 Presbyopia: Secondary | ICD-10-CM | POA: Diagnosis not present

## 2022-10-27 DIAGNOSIS — H25013 Cortical age-related cataract, bilateral: Secondary | ICD-10-CM | POA: Diagnosis not present

## 2022-10-27 DIAGNOSIS — H2513 Age-related nuclear cataract, bilateral: Secondary | ICD-10-CM | POA: Diagnosis not present

## 2022-11-03 ENCOUNTER — Telehealth: Payer: Self-pay | Admitting: Family Medicine

## 2022-11-03 NOTE — Telephone Encounter (Signed)
Contacted Kendra Hoffman to schedule their annual wellness visit. Appointment made for 11/10/2022.  Gabriel Cirri [facility] AWV TEAM Direct Dial [phone]

## 2022-11-10 ENCOUNTER — Ambulatory Visit (INDEPENDENT_AMBULATORY_CARE_PROVIDER_SITE_OTHER): Payer: Medicare HMO

## 2022-11-10 VITALS — Wt 138.0 lb

## 2022-11-10 DIAGNOSIS — Z0000 Encounter for general adult medical examination without abnormal findings: Secondary | ICD-10-CM

## 2022-11-10 NOTE — Patient Instructions (Addendum)
Kendra Hoffman , Thank you for taking time to come for your Medicare Wellness Visit. I appreciate your ongoing commitment to your health goals. Please review the following plan we discussed and let me know if I can assist you in the future.   These are the goals we discussed:  Goals       Increase physical activity (pt-stated)      I would like to take time for myself, also increase exercise.      Patient Stated      Get A1c down         This is a list of the screening recommended for you and due dates:  Health Maintenance  Topic Date Due   Zoster (Shingles) Vaccine (1 of 2) Never done   COVID-19 Vaccine (3 - Pfizer risk series) 12/08/2019   Pneumonia Vaccine (1 of 1 - PCV) Never done   DTaP/Tdap/Td vaccine (3 - Td or Tdap) 11/17/2020   Flu Shot  02/26/2023   Medicare Annual Wellness Visit  11/10/2023   Mammogram  07/30/2024   Colon Cancer Screening  05/19/2032   DEXA scan (bone density measurement)  Completed   Hepatitis C Screening: USPSTF Recommendation to screen - Ages 51-79 yo.  Completed   HPV Vaccine  Aged Out    Advanced directives: copies in chart   Conditions/risks identified: Get A1c down   Next appointment: Follow up in one year for your annual wellness visit    Preventive Care 65 Years and Older, Female Preventive care refers to lifestyle choices and visits with your health care provider that can promote health and wellness. What does preventive care include? A yearly physical exam. This is also called an annual well check. Dental exams once or twice a year. Routine eye exams. Ask your health care provider how often you should have your eyes checked. Personal lifestyle choices, including: Daily care of your teeth and gums. Regular physical activity. Eating a healthy diet. Avoiding tobacco and drug use. Limiting alcohol use. Practicing safe sex. Taking low-dose aspirin every day. Taking vitamin and mineral supplements as recommended by your health care  provider. What happens during an annual well check? The services and screenings done by your health care provider during your annual well check will depend on your age, overall health, lifestyle risk factors, and family history of disease. Counseling  Your health care provider may ask you questions about your: Alcohol use. Tobacco use. Drug use. Emotional well-being. Home and relationship well-being. Sexual activity. Eating habits. History of falls. Memory and ability to understand (cognition). Work and work Astronomer. Reproductive health. Screening  You may have the following tests or measurements: Height, weight, and BMI. Blood pressure. Lipid and cholesterol levels. These may be checked every 5 years, or more frequently if you are over 47 years old. Skin check. Lung cancer screening. You may have this screening every year starting at age 53 if you have a 30-pack-year history of smoking and currently smoke or have quit within the past 15 years. Fecal occult blood test (FOBT) of the stool. You may have this test every year starting at age 37. Flexible sigmoidoscopy or colonoscopy. You may have a sigmoidoscopy every 5 years or a colonoscopy every 10 years starting at age 55. Hepatitis C blood test. Hepatitis B blood test. Sexually transmitted disease (STD) testing. Diabetes screening. This is done by checking your blood sugar (glucose) after you have not eaten for a while (fasting). You may have this done every 1-3 years. Bone  density scan. This is done to screen for osteoporosis. You may have this done starting at age 75. Mammogram. This may be done every 1-2 years. Talk to your health care provider about how often you should have regular mammograms. Talk with your health care provider about your test results, treatment options, and if necessary, the need for more tests. Vaccines  Your health care provider may recommend certain vaccines, such as: Influenza vaccine. This is  recommended every year. Tetanus, diphtheria, and acellular pertussis (Tdap, Td) vaccine. You may need a Td booster every 10 years. Zoster vaccine. You may need this after age 15. Pneumococcal 13-valent conjugate (PCV13) vaccine. One dose is recommended after age 80. Pneumococcal polysaccharide (PPSV23) vaccine. One dose is recommended after age 63. Talk to your health care provider about which screenings and vaccines you need and how often you need them. This information is not intended to replace advice given to you by your health care provider. Make sure you discuss any questions you have with your health care provider. Document Released: 08/10/2015 Document Revised: 04/02/2016 Document Reviewed: 05/15/2015 Elsevier Interactive Patient Education  2017 [employer] Prevention in the Home Falls can cause injuries. They can happen to people of all ages. There are many things you can do to make your home safe and to help prevent falls. What can I do on the outside of my home? Regularly fix the edges of walkways and driveways and fix any cracks. Remove anything that might make you trip as you walk through a door, such as a raised step or threshold. Trim any bushes or trees on the path to your home. Use bright outdoor lighting. Clear any walking paths of anything that might make someone trip, such as rocks or tools. Regularly check to see if handrails are loose or broken. Make sure that both sides of any steps have handrails. Any raised decks and porches should have guardrails on the edges. Have any leaves, snow, or ice cleared regularly. Use sand or salt on walking paths during winter. Clean up any spills in your garage right away. This includes oil or grease spills. What can I do in the bathroom? Use night lights. Install grab bars by the toilet and in the tub and shower. Do not use towel bars as grab bars. Use non-skid mats or decals in the tub or shower. If you need to sit down in  the shower, use a plastic, non-slip stool. Keep the floor dry. Clean up any water that spills on the floor as soon as it happens. Remove soap buildup in the tub or shower regularly. Attach bath mats securely with double-sided non-slip rug tape. Do not have throw rugs and other things on the floor that can make you trip. What can I do in the bedroom? Use night lights. Make sure that you have a light by your bed that is easy to reach. Do not use any sheets or blankets that are too big for your bed. They should not hang down onto the floor. Have a firm chair that has side arms. You can use this for support while you get dressed. Do not have throw rugs and other things on the floor that can make you trip. What can I do in the kitchen? Clean up any spills right away. Avoid walking on wet floors. Keep items that you use a lot in easy-to-reach places. If you need to reach something above you, use a strong step stool that has a grab bar. Keep  electrical cords out of the way. Do not use floor polish or wax that makes floors slippery. If you must use wax, use non-skid floor wax. Do not have throw rugs and other things on the floor that can make you trip. What can I do with my stairs? Do not leave any items on the stairs. Make sure that there are handrails on both sides of the stairs and use them. Fix handrails that are broken or loose. Make sure that handrails are as long as the stairways. Check any carpeting to make sure that it is firmly attached to the stairs. Fix any carpet that is loose or worn. Avoid having throw rugs at the top or bottom of the stairs. If you do have throw rugs, attach them to the floor with carpet tape. Make sure that you have a light switch at the top of the stairs and the bottom of the stairs. If you do not have them, ask someone to add them for you. What else can I do to help prevent falls? Wear shoes that: Do not have high heels. Have rubber bottoms. Are comfortable  and fit you well. Are closed at the toe. Do not wear sandals. If you use a stepladder: Make sure that it is fully opened. Do not climb a closed stepladder. Make sure that both sides of the stepladder are locked into place. Ask someone to hold it for you, if possible. Clearly mark and make sure that you can see: Any grab bars or handrails. First and last steps. Where the edge of each step is. Use tools that help you move around (mobility aids) if they are needed. These include: Canes. Walkers. Scooters. Crutches. Turn on the lights when you go into a dark area. Replace any light bulbs as soon as they burn out. Set up your furniture so you have a clear path. Avoid moving your furniture around. If any of your floors are uneven, fix them. If there are any pets around you, be aware of where they are. Review your medicines with your doctor. Some medicines can make you feel dizzy. This can increase your chance of falling. Ask your doctor what other things that you can do to help prevent falls. This information is not intended to replace advice given to you by your health care provider. Make sure you discuss any questions you have with your health care provider. Document Released: 05/10/2009 Document Revised: 12/20/2015 Document Reviewed: 08/18/2014 Elsevier Interactive Patient Education  2017 [employer].

## 2022-11-10 NOTE — Progress Notes (Signed)
I connected with  Kendra Hoffman on 11/10/22 by a audio enabled telemedicine application and verified that I am speaking with the correct person using two identifiers.  Patient Location: Home  Provider Location: Office/Clinic  I discussed the limitations of evaluation and management by telemedicine. The patient expressed understanding and agreed to proceed.   Subjective:   Kendra Hoffman is a 67 y.o. female who presents for Medicare Annual (Subsequent) preventive examination.  Review of Systems     Cardiac Risk Factors include: advanced age (>61men, >68 women)     Objective:    Today's Vitals   11/10/22 1612  Weight: 138 lb (62.6 kg)   Body mass index is 23.69 kg/m.     11/10/2022    4:17 PM 10/24/2021    1:43 PM 10/12/2021    6:58 PM 04/10/2021   12:02 PM 09/18/2016    1:33 PM 08/24/2015    4:16 PM  Advanced Directives  Does Patient Have a Medical Advance Directive? Yes Yes No Yes Yes Yes  Type of Estate agent of [place];Living will Healthcare Power of [place];Living will  Healthcare Power of [place];Living will Healthcare Power of [place];Living will Healthcare Power of Attorney  Does patient want to make changes to medical advance directive? No - Patient declined No - Patient declined   No - Patient declined No - Patient declined  Copy of Healthcare Power of Attorney in Chart? Yes - validated most recent copy scanned in chart (See row information) No - copy requested  No - copy requested No - copy requested No - copy requested    Current Medications (verified) Outpatient Encounter Medications as of 11/10/2022  Medication Sig   aspirin EC 81 MG tablet Take 81 mg by mouth every evening.    cetirizine (ZYRTEC) 10 MG tablet Take by mouth.   citalopram (CELEXA) 20 MG tablet TAKE ONE TABLET BY MOUTH DAILY   estradiol (CLIMARA - DOSED IN MG/24 HR) 0.025 mg/24hr patch PLACE 1 PATCH ONTO THE SKIN ONCE A WEEK AS DIRECTED   medroxyPROGESTERone  (PROVERA) 2.5 MG tablet Take 1 tablet (2.5 mg total) by mouth daily.   Multiple Vitamin (MULTI-VITAMIN) tablet Take 1 tablet by mouth daily.   [DISCONTINUED] metoprolol tartrate (LOPRESSOR) 25 MG tablet Take 1 tablet 2 hours prior to CT   No facility-administered encounter medications on file as of 11/10/2022.    Allergies (verified) Alpha-gal, Bactrim [sulfamethoxazole-trimethoprim], and Sulfamethoxazole   History: Past Medical History:  Diagnosis Date   Allergic reaction to alpha-gal    Amenorrhea    Anemia    history of at age 89   Arthritis    bilateral hands   Breast nodule 09/2006   no mass, patient denies being told about this issue 09/18/2016   Cancer    melanoma   Cataract    Depression    "situational, lost 3 sibling and parents in short period of time"   Hx of melanoma of skin 2011   left foot    Mood disorder    PONV (postoperative nausea and vomiting)    Past Surgical History:  Procedure Laterality Date   BUNIONECTOMY     right   CERVICAL SPINE SURGERY     COLONOSCOPY     DILATATION & CURETTAGE/HYSTEROSCOPY WITH MYOSURE N/A 09/22/2016   Procedure: DILATATION & CURETTAGE/HYSTEROSCOPY WITH MYOSURE (possible Myosure);  Surgeon: Romualdo Bolk, MD;  Location: [facility];  Service: Gynecology;  Laterality: N/A;  follow first case   EYE SURGERY  LAPAROSCOPIC BILATERAL SALPINGO OOPHERECTOMY Bilateral 09/04/2015   Procedure: LAPAROSCOPIC BILATERAL SALPINGO OOPHORECTOMY with pelvic washings;  Surgeon: Romualdo Bolk, MD;  Location: [facility];  Service: Gynecology;  Laterality: Bilateral;   MELANOMA EXCISION  07/28/2010   left foot    OVARIAN CYST REMOVAL     TUMOR REMOVAL     Family History  Problem Relation Age of Onset   Macular degeneration Mother    Hypertension Mother    Dementia Mother    Atrial fibrillation Mother    Hyperlipidemia Mother    Arthritis Mother    Depression Mother    Hearing loss Mother    Heart disease Mother    Miscarriages /  [place] Mother    Diabetes Father    Heart attack Father        x 2   Heart disease Father    Arthritis Father        mostly in back; shoulder   Hyperlipidemia Father    Hypertension Father    Kidney disease Father    Depression Father    Hearing loss Father    Renal Disease Father    Alcohol abuse Sister    Arthritis Sister    Depression Sister    Drug abuse Sister    Hearing loss Sister    Hypertension Sister    Learning disabilities Sister    Other Sister        blood disorder - not hemochromotosis but similar requiring regular phlebotomy   Ovarian cancer Sister 31   Cancer Sister        ovarian   Early death Sister    Alcohol abuse Brother    Deep vein thrombosis Brother        disable and immoble also nephew   Arthritis Brother    Drug abuse Brother    Early death Brother    Hypertension Brother    Heart attack Maternal Grandmother    Early death Maternal Grandmother    Colon cancer Maternal Grandfather 60   Cancer Maternal Grandfather    Early death Maternal Grandfather    Diabetes Paternal Grandmother    Heart attack Paternal Grandmother 43   Early death Paternal Grandmother    Hearing loss Paternal Grandmother    Heart disease Paternal Grandmother    Hypertension Paternal Grandmother    Throat cancer Paternal Grandfather        smoker   Cancer Paternal Grandfather    Depression Paternal Grandfather    Early death Paternal Grandfather    Lung cancer Other    Colon cancer Other    Esophageal cancer Neg Hx    Stomach cancer Neg Hx    Rectal cancer Neg Hx    Social History   Socioeconomic History   Marital status: Single    Spouse name: Not on file   Number of children: 0   Years of education: Not on file   Highest education level: Not on file  Occupational History   Not on file  Tobacco Use   Smoking status: Never   Smokeless tobacco: Never  Vaping Use   Vaping Use: Never used  Substance and Sexual Activity   Alcohol use: Yes     Alcohol/week: 5.0 standard drinks of alcohol    Types: 5 Glasses of wine per week   Drug use: No   Sexual activity: Not Currently    Partners: Male    Birth control/protection: Post-menopausal    Comment: BSO  Other Topics Concern  Not on file  Social History Narrative   SingleHH of 1Pets 1 CatNeg ets firearms wears seat belts has smoke alarm. G1 2 jobs Had smoked a few time per year  And stopped totally for 3 years. Sister d  ovarian cancer/ parents ailing declining they live in [place] she is to travel back and forth. Sr. alcoholicOccasional social alcoholDown to 40 hours       Retired-property mgmt.  Cat and new puppy   Social Determinants of Health   Financial Resource Strain: Low Risk  (11/10/2022)   Overall Financial Resource Strain (CARDIA)    Difficulty of Paying Living Expenses: Not hard at all  Food Insecurity: No Food Insecurity (11/10/2022)   Hunger Vital Sign    Worried About Running Out of Food in the Last Year: Never true    Ran Out of Food in the Last Year: Never true  Transportation Needs: No Transportation Needs (11/10/2022)   PRAPARE - Administrator, Civil Service (Medical): No    Lack of Transportation (Non-Medical): No  Physical Activity: Insufficiently Active (11/10/2022)   Exercise Vital Sign    Days of Exercise per Week: 3 days    Minutes of Exercise per Session: 30 min  Stress: No Stress Concern Present (11/10/2022)   [employer] of Occupational Health - Occupational Stress Questionnaire    Feeling of Stress : Not at all  Social Connections: Socially Isolated (11/10/2022)   Social Connection and Isolation Panel [NHANES]    Frequency of Communication with Friends and Family: More than three times a week    Frequency of Social Gatherings with Friends and Family: More than three times a week    Attends Religious Services: Never    Database administrator or Organizations: No    Attends Engineer, structural: Never    Marital Status:  Never married    Tobacco Counseling Counseling given: Not Answered   Clinical Intake:  Pre-visit preparation completed: Yes  Pain : No/denies pain     BMI - recorded: 23.69 Nutritional Status: BMI of 19-24  Normal Diabetes: No  How often do you need to have someone help you when you read instructions, pamphlets, or other written materials from your doctor or pharmacy?: 1 - Never  Diabetic?no  Interpreter Needed?: No  Information entered by :: Lanier Ensign, LPN   Activities of Daily Living    11/10/2022    4:18 PM 01/02/2022    3:51 PM  In your present state of health, do you have any difficulty performing the following activities:  Hearing? 0 0  Vision? 0 0  Difficulty concentrating or making decisions? 0 0  Walking or climbing stairs? 0 0  Dressing or bathing? 0 0  Doing errands, shopping? 0 0  Preparing Food and eating ? N   Using the Toilet? N   In the past six months, have you accidently leaked urine? N   Do you have problems with loss of bowel control? N   Managing your Medications? N   Managing your Finances? N   Housekeeping or managing your Housekeeping? N     Patient Care Team: Jeani Sow, MD as PCP - General (Family Medicine) Jodelle Red, MD as PCP - Cardiology (Cardiology) Romualdo Bolk, MD as Consulting Physician (Obstetrics and Gynecology) Bettey Costa, MD as Consulting Physician (Dermatology)  Indicate any recent Medical Services you may have received from other than [facility] providers in the past year (date may be approximate).  Assessment:   This is a routine wellness examination for Damar.  Hearing/Vision screen Hearing Screening - Comments:: Pt stated slight declined  Vision Screening - Comments:: Pt follows up with Dr Randon Goldsmith for annual eye exams   Dietary issues and exercise activities discussed:     Goals Addressed             This Visit's Progress    Patient Stated       Get A1c down         Depression Screen    11/10/2022    4:16 PM 08/27/2022    3:00 PM 01/02/2022    3:51 PM 12/03/2021    3:00 PM 10/24/2021    1:34 PM 06/06/2020    2:27 PM 02/07/2014    4:01 PM  PHQ 2/9 Scores  PHQ - 2 Score 0 0 0 0 2 0 0  PHQ- 9 Score 0 1   2    Exception Documentation   Medical reason Medical reason       Fall Risk    11/10/2022    4:18 PM 08/27/2022    3:00 PM 01/02/2022    3:51 PM 12/03/2021    2:59 PM 10/24/2021    1:41 PM  Fall Risk   Falls in the past year? 0 0 0 0 0  Number falls in past yr: 0 0 0 0 0  Injury with Fall? 0 0 0 0 0  Risk for fall due to : Impaired vision;Impaired balance/gait No Fall Risks No Fall Risks No Fall Risks No Fall Risks  Follow up Falls prevention discussed Falls evaluation completed Falls evaluation completed;Education provided Falls evaluation completed;Education provided     FALL RISK PREVENTION PERTAINING TO THE HOME:  Any stairs in or around the home? Yes  If so, are there any without handrails? Yes  Home free of loose throw rugs in walkways, pet beds, electrical cords, etc? Yes  Adequate lighting in your home to reduce risk of falls? Yes   ASSISTIVE DEVICES UTILIZED TO PREVENT FALLS:  Life alert? No  Use of a cane, walker or w/c? No  Grab bars in the bathroom? No  Shower chair or bench in shower? No  Elevated toilet seat or a handicapped toilet? No   TIMED UP AND GO:  Was the test performed? No .   Cognitive Function:      10/23/2021    9:58 AM  Montreal Cognitive Assessment   Visuospatial/ Executive (0/5) 5  Naming (0/3) 3  Attention: Read list of digits (0/2) 2  Attention: Read list of letters (0/1) 1  Attention: Serial 7 subtraction starting at 100 (0/3) 1  Language: Repeat phrase (0/2) 2  Language : Fluency (0/1) 1  Abstraction (0/2) 2  Delayed Recall (0/5) 2  Orientation (0/6) 6  Total 25      10/24/2021    1:43 PM  6CIT Screen  What Year? 0 points  What month? 0 points  What time? 0 points  Count back from 20 0  points  Months in reverse 0 points  Repeat phrase 0 points  Total Score 0 points    Immunizations Immunization History  Administered Date(s) Administered   Fluad Quad(high Dose 65+) 08/27/2022   Influenza Inj Mdck Quad With Preservative 06/29/2018   Influenza, High Dose Seasonal PF 05/04/2021   Influenza, Seasonal, Injecte, Preservative Fre 04/27/2016   Influenza,inj,Quad PF,6+ Mos 06/06/2020   Influenza-Unspecified 04/27/2016   PFIZER(Purple Top)SARS-COV-2 Vaccination 10/10/2019, 11/10/2019   Td 07/28/1994  Tdap 11/18/2010    TDAP status: Due, Education has been provided regarding the importance of this vaccine. Advised may receive this vaccine at local pharmacy or Health Dept. Aware to provide a copy of the vaccination record if obtained from local pharmacy or Health Dept. Verbalized acceptance and understanding.  Flu Vaccine status: Up to date  Pneumococcal vaccine status: Due, Education has been provided regarding the importance of this vaccine. Advised may receive this vaccine at local pharmacy or Health Dept. Aware to provide a copy of the vaccination record if obtained from local pharmacy or Health Dept. Verbalized acceptance and understanding.  Covid-19 vaccine status: Completed vaccines  Qualifies for Shingles Vaccine? Yes   Zostavax completed No   Shingrix Completed?: No.    Education has been provided regarding the importance of this vaccine. Patient has been advised to call insurance company to determine out of pocket expense if they have not yet received this vaccine. Advised may also receive vaccine at local pharmacy or Health Dept. Verbalized acceptance and understanding.  Screening Tests Health Maintenance  Topic Date Due   Zoster Vaccines- Shingrix (1 of 2) Never done   COVID-19 Vaccine (3 - Pfizer risk series) 12/08/2019   Pneumonia Vaccine 32+ Years old (1 of 1 - PCV) Never done   DTaP/Tdap/Td (3 - Td or Tdap) 11/17/2020   INFLUENZA VACCINE  02/26/2023    Medicare Annual Wellness (AWV)  11/10/2023   MAMMOGRAM  07/30/2024   COLONOSCOPY (Pts 45-46yrs Insurance coverage will need to be confirmed)  05/19/2032   DEXA SCAN  Completed   Hepatitis C Screening  Completed   HPV VACCINES  Aged Out    Health Maintenance  Health Maintenance Due  Topic Date Due   Zoster Vaccines- Shingrix (1 of 2) Never done   COVID-19 Vaccine (3 - Pfizer risk series) 12/08/2019   Pneumonia Vaccine 80+ Years old (1 of 1 - PCV) Never done   DTaP/Tdap/Td (3 - Td or Tdap) 11/17/2020    Colorectal cancer screening: Type of screening: Colonoscopy. Completed 05/19/22. Repeat every 10 years  Mammogram status: Completed 07/30/22. Repeat every year  Bone Density status: Completed 01/01/21. Results reflect: Bone density results: NORMAL. Repeat every 2 years.   Additional Screening:  Hepatitis C Screening:  Completed 08/27/22  Vision Screening: Recommended annual ophthalmology exams for early detection of glaucoma and other disorders of the eye. Is the patient up to date with their annual eye exam?  Yes  Who is the provider or what is the name of the office in which the patient attends annual eye exams? Dr Randon Goldsmith  If pt is not established with a provider, would they like to be referred to a provider to establish care? No .   Dental Screening: Recommended annual dental exams for proper oral hygiene  Community Resource Referral / Chronic Care Management: CRR required this visit?  No   CCM required this visit?  No      Plan:     I have personally reviewed and noted the following in the patient's chart:   Medical and social history Use of alcohol, tobacco or illicit drugs  Current medications and supplements including opioid prescriptions. Patient is not currently taking opioid prescriptions. Functional ability and status Nutritional status Physical activity Advanced directives List of other physicians Hospitalizations, surgeries, and ER visits in previous 12  months Vitals Screenings to include cognitive, depression, and falls Referrals and appointments  In addition, I have reviewed and discussed with patient certain preventive protocols, quality metrics,  and best practice recommendations. A written personalized care plan for preventive services as well as general preventive health recommendations were provided to patient.     Marzella Schlein, LPN   11/03/8117   Nurse Notes: pt declined cognition at this time pt was knowledgeable to all questions asked

## 2022-11-11 ENCOUNTER — Ambulatory Visit (HOSPITAL_BASED_OUTPATIENT_CLINIC_OR_DEPARTMENT_OTHER): Payer: Medicare HMO | Admitting: Cardiology

## 2022-11-17 DIAGNOSIS — Z85820 Personal history of malignant melanoma of skin: Secondary | ICD-10-CM | POA: Diagnosis not present

## 2022-11-17 DIAGNOSIS — L820 Inflamed seborrheic keratosis: Secondary | ICD-10-CM | POA: Diagnosis not present

## 2022-11-17 DIAGNOSIS — L905 Scar conditions and fibrosis of skin: Secondary | ICD-10-CM | POA: Diagnosis not present

## 2022-11-17 DIAGNOSIS — D2262 Melanocytic nevi of left upper limb, including shoulder: Secondary | ICD-10-CM | POA: Diagnosis not present

## 2022-11-17 DIAGNOSIS — L570 Actinic keratosis: Secondary | ICD-10-CM | POA: Diagnosis not present

## 2022-11-17 DIAGNOSIS — D225 Melanocytic nevi of trunk: Secondary | ICD-10-CM | POA: Diagnosis not present

## 2022-11-17 DIAGNOSIS — L821 Other seborrheic keratosis: Secondary | ICD-10-CM | POA: Diagnosis not present

## 2022-12-08 ENCOUNTER — Encounter (HOSPITAL_BASED_OUTPATIENT_CLINIC_OR_DEPARTMENT_OTHER): Payer: Medicare Other | Admitting: Nurse Practitioner

## 2022-12-20 ENCOUNTER — Other Ambulatory Visit: Payer: Self-pay | Admitting: Obstetrics and Gynecology

## 2022-12-20 DIAGNOSIS — Z79890 Hormone replacement therapy (postmenopausal): Secondary | ICD-10-CM

## 2022-12-24 NOTE — Telephone Encounter (Signed)
Med refill request: medroxyprogesterone 2.5mg  Last AEX: 12/27/21 Next AEX: 01/20/23 Last MMG (if hormonal med) 07/30/22 Refill authorized: Please Advise?

## 2022-12-31 ENCOUNTER — Ambulatory Visit: Payer: Medicare Other | Admitting: Obstetrics and Gynecology

## 2023-01-10 ENCOUNTER — Other Ambulatory Visit: Payer: Self-pay | Admitting: Obstetrics and Gynecology

## 2023-01-10 DIAGNOSIS — Z79890 Hormone replacement therapy (postmenopausal): Secondary | ICD-10-CM

## 2023-01-12 NOTE — Telephone Encounter (Signed)
Medication refill request: estradiol 0.25 patch  Last AEX:  12/27/21 with jj Next AEX: 01/20/23  Last MMG (if hormonal medication request): 07/30/22 normal  Refill authorized: #4 with 0 rf pended for today

## 2023-01-20 ENCOUNTER — Ambulatory Visit (INDEPENDENT_AMBULATORY_CARE_PROVIDER_SITE_OTHER): Payer: Medicare HMO | Admitting: Obstetrics and Gynecology

## 2023-01-20 ENCOUNTER — Encounter: Payer: Self-pay | Admitting: Obstetrics and Gynecology

## 2023-01-20 VITALS — BP 122/64 | HR 79 | Ht 64.5 in | Wt 131.0 lb

## 2023-01-20 DIAGNOSIS — Z79890 Hormone replacement therapy (postmenopausal): Secondary | ICD-10-CM

## 2023-01-20 DIAGNOSIS — Z8659 Personal history of other mental and behavioral disorders: Secondary | ICD-10-CM

## 2023-01-20 MED ORDER — PROGESTERONE MICRONIZED 100 MG PO CAPS
100.0000 mg | ORAL_CAPSULE | Freq: Every day | ORAL | 3 refills | Status: AC
Start: 2023-01-20 — End: ?

## 2023-01-20 MED ORDER — ESTRADIOL 0.025 MG/24HR TD PTWK: MEDICATED_PATCH | TRANSDERMAL | 3 refills | Status: DC

## 2023-01-20 MED ORDER — CITALOPRAM HYDROBROMIDE 20 MG PO TABS
ORAL_TABLET | ORAL | 3 refills | Status: AC
Start: 2023-01-20 — End: ?

## 2023-01-20 NOTE — Progress Notes (Signed)
67 y.o. G1P0010 Single White or Caucasian Not Hispanic or Latino female here for a medication check.  She is on low dose HRT, she has some vasomotor symptoms but thinks its from her alpha gal (only dietary related). She hasn't taken it late. She is willing to try and go off of HRT.  She is on Celexa for depression, which is working.      She saw Cardiology earlier this year. Her 10 year ASCVD risk score was 5.6%.   Patient's last menstrual period was 07/28/2008.          Sexually active: No.  The current method of family planning is post menopausal status.    Exercising: Yes.     Walks  Smoker:  no  Health Maintenance: Pap: 12/27/21 WNL; 12/07/19 Normal. HPV Neg.  History of abnormal Pap:  Yes  h/o cryosurgery in 20s/ colposcopy 09-04-16   MMG:  07/30/22 Bi-rads 1 neg  BMD:   01/01/21 normal  Colonoscopy: 05/19/22 f/u 10 years  TDaP:  11/18/10, thinks it is UTD with primary.  Gardasil: n/a   reports that she has never smoked. She has never used smokeless tobacco. She reports current alcohol use of about 5.0 standard drinks of alcohol per week. She reports that she does not use drugs. Retired Production designer, theatre/television/film.   Past Medical History:  Diagnosis Date   Allergic reaction to alpha-gal    Amenorrhea    Anemia    history of at age 37   Arthritis    bilateral hands   Breast nodule 09/2006   no mass, patient denies being told about this issue 09/18/2016   Cancer [facility])    melanoma   Cataract    Depression    "situational, lost 3 sibling and parents in short period of time"   Hx of melanoma of skin 2011   left foot    Mood disorder (HCC)    PONV (postoperative nausea and vomiting)     Past Surgical History:  Procedure Laterality Date   BUNIONECTOMY     right   CERVICAL SPINE SURGERY     COLONOSCOPY     DILATATION & CURETTAGE/HYSTEROSCOPY WITH MYOSURE N/A 09/22/2016   Procedure: DILATATION & CURETTAGE/HYSTEROSCOPY WITH MYOSURE (possible Myosure);  Surgeon: Romualdo Bolk, MD;   Location: [facility];  Service: Gynecology;  Laterality: N/A;  follow first case   EYE SURGERY     LAPAROSCOPIC BILATERAL SALPINGO OOPHERECTOMY Bilateral 09/04/2015   Procedure: LAPAROSCOPIC BILATERAL SALPINGO OOPHORECTOMY with pelvic washings;  Surgeon: Romualdo Bolk, MD;  Location: [facility];  Service: Gynecology;  Laterality: Bilateral;   MELANOMA EXCISION  07/28/2010   left foot    OVARIAN CYST REMOVAL     TUMOR REMOVAL      Current Outpatient Medications  Medication Sig Dispense Refill   aspirin EC 81 MG tablet Take 81 mg by mouth every evening.      cetirizine (ZYRTEC) 10 MG tablet Take by mouth.     citalopram (CELEXA) 20 MG tablet TAKE ONE TABLET BY MOUTH DAILY 90 tablet 3   estradiol (CLIMARA - DOSED IN MG/24 HR) 0.025 mg/24hr patch PLACE 1 PATCH ONTO THE SKIN ONCE WEEKLY AS DIRECTED 4 patch 0   medroxyPROGESTERone (PROVERA) 2.5 MG tablet TAKE 1 TABLET BY MOUTH DAILY 90 tablet 0   Multiple Vitamin (MULTI-VITAMIN) tablet Take 1 tablet by mouth daily.     No current facility-administered medications for this visit.    Family History  Problem Relation Age of Onset  Macular degeneration Mother    Hypertension Mother    Dementia Mother    Atrial fibrillation Mother    Hyperlipidemia Mother    Arthritis Mother    Depression Mother    Hearing loss Mother    Heart disease Mother    Miscarriages / [place] Mother    Diabetes Father    Heart attack Father        x 2   Heart disease Father    Arthritis Father        mostly in back; shoulder   Hyperlipidemia Father    Hypertension Father    Kidney disease Father    Depression Father    Hearing loss Father    Renal Disease Father    Alcohol abuse Sister    Arthritis Sister    Depression Sister    Drug abuse Sister    Hearing loss Sister    Hypertension Sister    Learning disabilities Sister    Other Sister        blood disorder - not hemochromotosis but similar requiring regular phlebotomy   Ovarian cancer Sister  43   Cancer Sister        ovarian   Early death Sister    Alcohol abuse Brother    Deep vein thrombosis Brother        disable and immoble also nephew   Arthritis Brother    Drug abuse Brother    Early death Brother    Hypertension Brother    Heart attack Maternal Grandmother    Early death Maternal Grandmother    Colon cancer Maternal Grandfather 60   Cancer Maternal Grandfather    Early death Maternal Grandfather    Diabetes Paternal Grandmother    Heart attack Paternal Grandmother 16   Early death Paternal Grandmother    Hearing loss Paternal Grandmother    Heart disease Paternal Grandmother    Hypertension Paternal Grandmother    Throat cancer Paternal Grandfather        smoker   Cancer Paternal Grandfather    Depression Paternal Grandfather    Early death Paternal Grandfather    Lung cancer Other    Colon cancer Other    Esophageal cancer Neg Hx    Stomach cancer Neg Hx    Rectal cancer Neg Hx     Review of Systems  All other systems reviewed and are negative.   Exam:   BP 122/64   Pulse 79   Ht 5' 4.5" (1.638 m)   Wt 131 lb (59.4 kg)   LMP 07/28/2008   SpO2 100%   BMI 22.14 kg/m   Weight change: @WEIGHTCHANGE @ Height:   Height: 5' 4.5" (163.8 cm)  Ht Readings from Last 3 Encounters:  01/20/23 5' 4.5" (1.638 m)  09/22/22 5\' 4"  (1.626 m)  08/27/22 5\' 4"  (1.626 m)    General appearance: alert, cooperative and appears stated age  67. Hormone replacement therapy (HRT) We discussed the risks, she is willing to try and go off. Will send in a script just in case she stays on it. Will change from provera to prometrium.  - estradiol (CLIMARA - DOSED IN MG/24 HR) 0.025 mg/24hr patch; PLACE 1 PATCH ONTO THE SKIN ONCE WEEKLY AS DIRECTED  Dispense: 24 patch; Refill: 3 - progesterone (PROMETRIUM) 100 MG capsule; Take 1 capsule (100 mg total) by mouth daily.  Dispense: 90 capsule; Refill: 3  2. History of depression Doing well, she may try and wean off.  -  citalopram (CELEXA) 20 MG tablet; TAKE ONE TABLET BY MOUTH DAILY  Dispense: 90 tablet; Refill: 3

## 2023-02-09 ENCOUNTER — Telehealth: Payer: Self-pay

## 2023-02-09 NOTE — Telephone Encounter (Signed)
Per ML: "Recommend leaving as is at this time.  Schedule with Dr Edward Jolly if patient would like to review all options. Dr Elbert Ewings"

## 2023-02-09 NOTE — Telephone Encounter (Signed)
Per ML: "So, this is a Progesterone, bio-identical, and 100 mg daily is the safe dosage to protect her Endometrium from the Estradiol.  Estradiol/Progestin patches could be an alternative if she would prefer. Dr Seymour Bars"  Pt reports she was on a combi-patch in the past. However, she recalls there being a conversation with her and Dr. Oscar La. Something about there not being a low enough dosage for her to take for the combination patch for her age. However, she states that she would love to go back on a patch if something changed and if prometrium wasn't available in a tablet form.

## 2023-02-09 NOTE — Telephone Encounter (Signed)
Pt notified and voiced understanding. Will close.

## 2023-02-09 NOTE — Telephone Encounter (Signed)
JJ pt calling to inquire about the switch from provera to prometrium at her recent visit w/ JJ on 01/20/2023.   Wants to inquire as to the difference btwn the two forms of progesterone? Is wondering if the prometrium 100mg  is the lowest dose and if this medication is only available in a gel capsule form since she has alpha-gal?  Pt denies any concerns so far with taking gel capsule. However, would just prefer a tablet form if possible but thinks she will be ok for one capsule once a day if necessary.   Please advise.

## 2023-04-01 ENCOUNTER — Other Ambulatory Visit: Payer: Self-pay

## 2023-04-01 ENCOUNTER — Ambulatory Visit (HOSPITAL_BASED_OUTPATIENT_CLINIC_OR_DEPARTMENT_OTHER): Payer: Medicare HMO | Admitting: Cardiology

## 2023-04-01 DIAGNOSIS — Z79890 Hormone replacement therapy (postmenopausal): Secondary | ICD-10-CM

## 2023-04-01 NOTE — Telephone Encounter (Signed)
Med refill request: Provera 2.5mg  Last AEX: 01/20/2023 w/ JJ Next AEX: recall placed for 12/2023 Last MMG (if hormonal med): 07/30/2022-neg birads 1; Cat C Refill authorized: refused.   Rx changed from provera to prometrium at visit w/ JJ in 12/2022. Rx request refused.  Routing to [facility] for final review.

## 2023-08-05 DIAGNOSIS — Z1231 Encounter for screening mammogram for malignant neoplasm of breast: Secondary | ICD-10-CM | POA: Diagnosis not present

## 2023-08-05 LAB — HM MAMMOGRAPHY

## 2023-08-06 ENCOUNTER — Encounter: Payer: Self-pay | Admitting: Family Medicine

## 2023-08-31 ENCOUNTER — Encounter: Payer: Self-pay | Admitting: Family Medicine

## 2023-08-31 ENCOUNTER — Ambulatory Visit: Payer: Medicare HMO | Admitting: Family Medicine

## 2023-08-31 VITALS — BP 120/76 | HR 67 | Temp 97.5°F | Resp 18 | Ht 64.5 in | Wt 129.2 lb

## 2023-08-31 DIAGNOSIS — R7303 Prediabetes: Secondary | ICD-10-CM | POA: Insufficient documentation

## 2023-08-31 DIAGNOSIS — R1319 Other dysphagia: Secondary | ICD-10-CM | POA: Diagnosis not present

## 2023-08-31 DIAGNOSIS — Z0000 Encounter for general adult medical examination without abnormal findings: Secondary | ICD-10-CM | POA: Diagnosis not present

## 2023-08-31 DIAGNOSIS — E7800 Pure hypercholesterolemia, unspecified: Secondary | ICD-10-CM | POA: Diagnosis not present

## 2023-08-31 DIAGNOSIS — Z23 Encounter for immunization: Secondary | ICD-10-CM

## 2023-08-31 DIAGNOSIS — M79671 Pain in right foot: Secondary | ICD-10-CM

## 2023-08-31 NOTE — Progress Notes (Signed)
Phone [phone]   Subjective:   Patient is a 68 y.o. female presenting for annual physical.    Chief Complaint  Patient presents with   Annual Exam    CPE Not fasting   Annual-late PreDM-has alpha gal.   HLD-FH CAD-calcium score 0.    Discussed the use of AI scribe software for clinical note transcription with the patient, who gave verbal consent to proceed.  History of Present Illness   The patient is a 68 year old female who presents with foot pain and gastrointestinal symptoms.  She experiences tenderness and popping in the right foot, particularly on the lateral side, which has persisted for several months. The pain can be severe enough to hinder walking but is not constant. It tends to worsen in the morning, possibly linked to her sleeping position. There is no recollection of any specific injury that might have caused this issue. She has a history of a bunionectomy on L, and she notes that the bunion is returning, although it does not cause constant pain.  She experiences slight nausea and possible heartburn, particularly in the evenings after eating. She avoids spicy or greasy foods and is unsure of the exact triggers. She recalls being diagnosed with the beginnings of a hiatal hernia about 15 years ago but has not had significant issues since then. She took famotidine last week, which she had been prescribed years ago for similar symptoms. No vomiting or diarrhea. Occasional dizziness resolves with deep breathing.  She has experienced swallowing issues, primarily with liquids, for several years. A swallowing test in 2019 showed normal results, but she continues to experience episodes of choking, particularly with liquids. This occurs infrequently, about once every six weeks. No chest pain or shortness of breath.  She has a history of alpha-gal syndrome, which affects her ability to consume certain meats. She can eat some pork and bacon but has not tried beef in five years. She  is aware that the severity of symptoms can vary among individuals with this condition. No frequent headaches, depression, or suicidal thoughts. Reports arthritis primarily in her hands.   preDM and HLD-working on diet and some exercise       See problem oriented charting- ROS- ROS: Gen: no fever, chills  Skin: no rash, itching ENT: no ear pain, ear drainage, nasal congestion, rhinorrhea, sinus pressure, sore throat Eyes: no blurry vision, double vision Resp: no cough, wheeze,SOB CV: no CP, palpitations, LE edema,  GI: no heartburn, n/v/d/c, abd pain GU: no dysuria, urgency, frequency, hematuria MSK: no joint pain, myalgias, back pain Neuro: no headache, weakness, vertigo.  Occ dizziness-deep breath and resolves Psych: no depression, anxiety, insomnia, SI   The following were reviewed and entered/updated in epic: Past Medical History:  Diagnosis Date   Allergic reaction to alpha-gal    Amenorrhea    Anemia    history of at age 89   Arthritis    bilateral hands   Breast nodule 09/2006   no mass, patient denies being told about this issue 09/18/2016   Cancer [facility])    melanoma   Cataract    Depression    "situational, lost 3 sibling and parents in short period of time"   Hx of melanoma of skin 2011   left foot    Mood disorder (HCC)    PONV (postoperative nausea and vomiting)    Patient Active Problem List   Diagnosis Date Noted   Prediabetes 08/31/2023   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  Dermatochalasis of both upper eyelids 01/07/2022   Lumbar radicular pain 01/03/2022   Hyperlipidemia 12/03/2021   Preventative health care 12/03/2021   History of iron deficiency 12/03/2021   Hx of melanoma of skin 08/29/2016   Medication management 02/07/2013   Visit for preventive health examination 11/18/2010   PERIMENOPAUSAL SYNDROME 12/21/2009   HAND PAIN, BILATERAL 12/21/2009   HSV 10/13/2007   Insomnia 10/13/2007   BACK PAIN 04/26/2007   Past  Surgical History:  Procedure Laterality Date   BLEPHAROPLASTY Right    BUNIONECTOMY     right   CERVICAL SPINE SURGERY     COLONOSCOPY     DILATATION & CURETTAGE/HYSTEROSCOPY WITH MYOSURE N/A 09/22/2016   Procedure: DILATATION & CURETTAGE/HYSTEROSCOPY WITH MYOSURE (possible Myosure);  Surgeon: Romualdo Bolk, MD;  Location: [facility];  Service: Gynecology;  Laterality: N/A;  follow first case   EYE SURGERY     LAPAROSCOPIC BILATERAL SALPINGO OOPHERECTOMY Bilateral 09/04/2015   Procedure: LAPAROSCOPIC BILATERAL SALPINGO OOPHORECTOMY with pelvic washings;  Surgeon: Romualdo Bolk, MD;  Location: [facility];  Service: Gynecology;  Laterality: Bilateral;   MELANOMA EXCISION  07/28/2010   left foot    OVARIAN CYST REMOVAL     TUMOR REMOVAL Right    eye    Family History  Problem Relation Age of Onset   Macular degeneration Mother    Hypertension Mother    Dementia Mother    Atrial fibrillation Mother    Hyperlipidemia Mother    Arthritis Mother    Depression Mother    Hearing loss Mother    Heart disease Mother    Miscarriages / [place] Mother    Diabetes Father    Heart attack Father        x 2   Heart disease Father    Arthritis Father        mostly in back; shoulder   Hyperlipidemia Father    Hypertension Father    Kidney disease Father    Depression Father    Hearing loss Father    Renal Disease Father    Alcohol abuse Sister    Arthritis Sister    Depression Sister    Drug abuse Sister    Hearing loss Sister    Hypertension Sister    Learning disabilities Sister    Other Sister        blood disorder - not hemochromotosis but similar requiring regular phlebotomy   Ovarian cancer Sister 63   Cancer Sister        ovarian   Early death Sister    Alcohol abuse Brother    Deep vein thrombosis Brother        disable and immoble also nephew   Arthritis Brother    Drug abuse Brother    Early death Brother    Hypertension Brother    Heart attack Maternal  Grandmother    Early death Maternal Grandmother    Colon cancer Maternal Grandfather 60   Cancer Maternal Grandfather    Early death Maternal Grandfather    Diabetes Paternal Grandmother    Heart attack Paternal Grandmother 30   Early death Paternal Grandmother    Hearing loss Paternal Grandmother    Heart disease Paternal Grandmother    Hypertension Paternal Grandmother    Throat cancer Paternal Grandfather        smoker   Cancer Paternal Grandfather    Depression Paternal Grandfather    Early death Paternal Grandfather    Lung cancer Other  Colon cancer Other    Esophageal cancer Neg Hx    Stomach cancer Neg Hx    Rectal cancer Neg Hx     Medications- reviewed and updated Current Outpatient Medications  Medication Sig Dispense Refill   aspirin EC 81 MG tablet Take 81 mg by mouth every evening.      cetirizine (ZYRTEC) 10 MG tablet Take by mouth.     citalopram (CELEXA) 20 MG tablet TAKE ONE TABLET BY MOUTH DAILY 90 tablet 3   estradiol (CLIMARA - DOSED IN MG/24 HR) 0.025 mg/24hr patch PLACE 1 PATCH ONTO THE SKIN ONCE WEEKLY AS DIRECTED 24 patch 3   Multiple Vitamin (MULTI-VITAMIN) tablet Take 1 tablet by mouth daily.     progesterone (PROMETRIUM) 100 MG capsule Take 1 capsule (100 mg total) by mouth daily. 90 capsule 3   metoprolol tartrate (LOPRESSOR) 25 MG tablet      No current facility-administered medications for this visit.    Allergies-reviewed and updated Allergies  Allergen Reactions   Alpha-Gal    Sulfa Antibiotics Other (See Comments)   Bactrim [Sulfamethoxazole-Trimethoprim] Rash   Sulfamethoxazole Rash    Social History   Social History Narrative   SingleHH of 1Pets 1 CatNeg ets firearms wears seat belts has smoke alarm. G1 2 jobs Had smoked a few time per year  And stopped totally for 3 years. Sister d  ovarian cancer/ parents ailing declining they live in [place] she is to travel back and forth. Sr. alcoholicOccasional social alcoholDown to 40  hours       Retired-property mgmt.  Cat and new puppy   Objective  Objective:  BP 120/76   Pulse 67   Temp (!) 97.5 F (36.4 C) (Temporal)   Resp 18   Ht 5' 4.5" (1.638 m)   Wt 129 lb 4 oz (58.6 kg)   LMP 07/28/2008   SpO2 97%   BMI 21.84 kg/m  Physical Exam  Gen: WDWN NAD HEENT: NCAT, conjunctiva not injected, sclera nonicteric TM WNL B, OP moist, no exudates  NECK:  supple, no thyromegaly, no nodes, no carotid bruits CARDIAC: RRR, S1S2+, no murmur. DP 2+B LUNGS: CTAB. No wheezes ABDOMEN:  BS+, soft, NTND, No HSM, no masses EXT:  no edema MSK: no gross abnormalities. MS 5/5 all 4.  No TTP R foot NEURO: A&O x3.  CN II-XII intact.  PSYCH: normal mood. Good eye contact     Assessment and Plan   Health Maintenance counseling: 1. Anticipatory guidance: Patient counseled regarding regular dental exams q6 months, eye exams,  avoiding smoking and second hand smoke, limiting alcohol to 1 beverage per day, no illicit drugs.   2. Risk factor reduction:  Advised patient of need for regular exercise and diet rich and fruits and vegetables to reduce risk of heart attack and stroke. Exercise- encouraged.  Wt Readings from Last 3 Encounters:  08/31/23 129 lb 4 oz (58.6 kg)  01/20/23 131 lb (59.4 kg)  11/10/22 138 lb (62.6 kg)   3. Immunizations/screenings/ancillary studies Immunization History  Administered Date(s) Administered   Fluad Quad(high Dose 65+) 08/27/2022   Influenza Inj Mdck Quad With Preservative 06/29/2018   Influenza, High Dose Seasonal PF 05/04/2021   Influenza, Seasonal, Injecte, Preservative Fre 04/27/2016, 08/31/2023   Influenza,inj,Quad PF,6+ Mos 04/27/2016, 06/06/2020   Influenza-Unspecified 04/27/2016   PFIZER Comirnaty(Gray Top)Covid-19 Tri-Sucrose Vaccine 12/04/2020   PFIZER(Purple Top)SARS-COV-2 Vaccination 10/10/2019, 11/10/2019, 07/13/2020   Td 07/28/1994   Tdap 11/18/2010   Health Maintenance Due  Topic Date Due  DTaP/Tdap/Td (3 - Td or Tdap)  11/17/2020    4. Cervical cancer screening- utd 5. Breast cancer screening-  mammogram utd 6. Colon cancer screening - utd 7. Skin cancer screening- advised regular sunscreen use. Denies worrisome, changing, or new skin lesions.  8. Birth control/STD check- n/a 9. Osteoporosis screening- gyn 10. Smoking associated screening - non smoker  Wellness examination  Pure hypercholesterolemia -     Comprehensive metabolic panel -     Lipid panel  Prediabetes -     Comprehensive metabolic panel -     Hemoglobin A1c  Other dysphagia -     CBC with Differential/Platelet -     Amylase -     Lipase  Right foot pain  Need for influenza vaccination -     Flu vaccine trivalent PF, 6mos and older(Flulaval,Afluria,Fluarix,Fluzone)  Assessment and Plan    Right Foot Pain Intermittent tenderness and a popping sensation on the lateral aspect of the right foot have persisted for several months, worsening in the morning. The differential includes tendonitis or ligament injury. Discussed the potential for these conditions and the benefits of physical therapy and sports medicine evaluation. A referral to sports medicine is made for further evaluation and possible physical therapy. X-rays will be considered if deemed necessary by sports medicine.  Gastroesophageal Reflux Disease (GERD) Slight nausea and heartburn occur several times a week, more noticeable in the afternoon or evening. There is a history of hiatal hernia, and symptoms may be related to diet, particularly bacon consumption. The differential includes GERD or exacerbation of the hiatal hernia. Famotidine is considered for 2-3 weeks for symptom relief. Monitoring dietary intake, especially bacon, is important to identify triggers. Blood work will be ordered to check pancreatic enzymes to rule out other causes.  Alpha-gal Syndrome There is improved tolerance to pork but continued avoidance of beef. Symptoms may include heartburn related to  pork consumption. It is important to continue avoiding beef and to monitor tolerance to pork. Documenting any correlation between pork consumption and gastrointestinal symptoms is crucial for managing the condition.  Swallowing Difficulties Intermittent choking episodes, primarily with liquids, have been ongoing for years. A previous swallow study in 2019 was normal. Although symptoms are infrequent, they are concerning. Monitoring and documenting episodes of choking and any associated triggers is important. A referral to GI will be considered if symptoms worsen or become more frequent.  General Health Maintenance Routine health maintenance was discussed, including vaccinations and lifestyle modifications. The importance of flu and pneumonia vaccinations was emphasized, and tetanus vaccination status was reviewed. Regular exercise, a healthy diet, and safety measures such as wearing a seatbelt were encouraged. A flu shot and pneumonia shot will be administered, and tetanus vaccination will be given if due.   HLD and PreDM-working on diet-encouraged consistency and increasing exercise.  check labs  Follow-up Follow up with sports medicine for right foot pain. Monitor and document gastrointestinal symptoms and dietary triggers. Return for further evaluation if swallowing difficulties worsen. A routine follow-up visit is scheduled in one year.        Recommended follow up: Return in about 1 year (around 08/30/2024) for annual physical.  Lab/Order associations:+ fasting  Angelena Sole, MD

## 2023-08-31 NOTE — Patient Instructions (Signed)
It was very nice to see you today!  Keep log of heartburn Consider famotidine for 2-3 wks.  [facility] at [facility]  [address] on the 1st floor Phone number [phone]    PLEASE NOTE:  If you had any lab tests please let us know if you have not heard back within a few days. You may see your results on MyChart before we have a chance to review them but we will give you a call once they are reviewed by [place]. If we ordered any referrals today, please let us know if you have not heard from their office within the next week.   Please try these tips to maintain a healthy lifestyle:  Eat most of your calories during the day when you are active. Eliminate processed foods including packaged sweets (pies, cakes, cookies), reduce intake of potatoes, white bread, white pasta, and white rice. Look for whole grain options, oat flour or almond flour.  Each meal should contain half fruits/vegetables, one quarter protein, and one quarter carbs (no bigger than a computer mouse).  Cut down on sweet beverages. This includes juice, soda, and sweet tea. Also watch fruit intake, though this is a healthier sweet option, it still contains natural sugar! Limit to 3 servings daily.  Drink at least 1 glass of water with each meal and aim for at least 8 glasses per day  Exercise at least 150 minutes every week.

## 2023-09-01 LAB — COMPREHENSIVE METABOLIC PANEL
ALT: 8 U/L (ref 0–35)
AST: 18 U/L (ref 0–37)
Albumin: 4.3 g/dL (ref 3.5–5.2)
Alkaline Phosphatase: 66 U/L (ref 39–117)
BUN: 15 mg/dL (ref 6–23)
CO2: 27 meq/L (ref 19–32)
Calcium: 9 mg/dL (ref 8.4–10.5)
Chloride: 102 meq/L (ref 96–112)
Creatinine, Ser: 0.92 mg/dL (ref 0.40–1.20)
GFR: 64.2 mL/min (ref >60.00)
Glucose, Bld: 80 mg/dL (ref 70–99)
Potassium: 3.9 meq/L (ref 3.5–5.1)
Sodium: 137 meq/L (ref 135–145)
Total Bilirubin: 0.5 mg/dL (ref 0.2–1.2)
Total Protein: 6.9 g/dL (ref 6.0–8.3)

## 2023-09-01 LAB — LIPID PANEL
Cholesterol: 204 mg/dL — ABNORMAL HIGH (ref 0–200)
HDL: 65.6 mg/dL (ref >39.00)
LDL Cholesterol: 124 mg/dL — ABNORMAL HIGH (ref 0–99)
NonHDL: 138.8
Total CHOL/HDL Ratio: 3
Triglycerides: 74 mg/dL (ref 0.0–149.0)
VLDL: 14.8 mg/dL (ref 0.0–40.0)

## 2023-09-01 LAB — CBC WITH DIFFERENTIAL/PLATELET
Basophils Absolute: 0.1 10*3/uL (ref 0.0–0.1)
Basophils Relative: 1.3 % (ref 0.0–3.0)
Eosinophils Absolute: 0.3 10*3/uL (ref 0.0–0.7)
Eosinophils Relative: 5.9 % — ABNORMAL HIGH (ref 0.0–5.0)
HCT: 42.3 % (ref 36.0–46.0)
Hemoglobin: 14.2 g/dL (ref 12.0–15.0)
Lymphocytes Relative: 36.9 % (ref 12.0–46.0)
Lymphs Abs: 2 10*3/uL (ref 0.7–4.0)
MCHC: 33.5 g/dL (ref 30.0–36.0)
MCV: 97.4 fL (ref 78.0–100.0)
Monocytes Absolute: 0.7 10*3/uL (ref 0.1–1.0)
Monocytes Relative: 13.3 % — ABNORMAL HIGH (ref 3.0–12.0)
Neutro Abs: 2.3 10*3/uL (ref 1.4–7.7)
Neutrophils Relative %: 42.6 % — ABNORMAL LOW (ref 43.0–77.0)
Platelets: 226 10*3/uL (ref 150.0–400.0)
RBC: 4.35 Mil/uL (ref 3.87–5.11)
RDW: 13.1 % (ref 11.5–15.5)
WBC: 5.4 10*3/uL (ref 4.0–10.5)

## 2023-09-01 LAB — HEMOGLOBIN A1C: Hgb A1c MFr Bld: 5.7 % (ref 4.6–6.5)

## 2023-09-01 LAB — LIPASE: Lipase: 6 U/L — ABNORMAL LOW (ref 11.0–59.0)

## 2023-09-01 LAB — AMYLASE: Amylase: 43 U/L (ref 27–131)

## 2023-09-01 NOTE — Progress Notes (Signed)
Labs stable.  Continue working on diet/exercise

## 2023-09-03 ENCOUNTER — Encounter: Payer: Self-pay | Admitting: *Deleted

## 2023-09-03 ENCOUNTER — Telehealth: Payer: Self-pay | Admitting: Family Medicine

## 2023-09-03 NOTE — Telephone Encounter (Signed)
 Labs printed and placed up front for pick-up. Patient notified.

## 2023-09-03 NOTE — Telephone Encounter (Unsigned)
 Copied from CRM [id number]. Topic: General - Other >> Sep 02, 2023  5:04 PM Kendra Hoffman wrote: Reason for CRM: pt called and asked if her lab results could be printed for her. Please call back when she can pick up her paperwork.

## 2023-10-07 DIAGNOSIS — M4802 Spinal stenosis, cervical region: Secondary | ICD-10-CM | POA: Diagnosis not present

## 2023-10-09 ENCOUNTER — Other Ambulatory Visit: Payer: Self-pay | Admitting: Neurological Surgery

## 2023-10-09 DIAGNOSIS — M4802 Spinal stenosis, cervical region: Secondary | ICD-10-CM

## 2023-10-28 DIAGNOSIS — H5213 Myopia, bilateral: Secondary | ICD-10-CM | POA: Diagnosis not present

## 2023-10-28 DIAGNOSIS — H25013 Cortical age-related cataract, bilateral: Secondary | ICD-10-CM | POA: Diagnosis not present

## 2023-10-28 DIAGNOSIS — H25043 Posterior subcapsular polar age-related cataract, bilateral: Secondary | ICD-10-CM | POA: Diagnosis not present

## 2023-10-28 DIAGNOSIS — H2513 Age-related nuclear cataract, bilateral: Secondary | ICD-10-CM | POA: Diagnosis not present

## 2023-11-11 ENCOUNTER — Ambulatory Visit
Admission: RE | Admit: 2023-11-11 | Discharge: 2023-11-11 | Disposition: A | Discharge status: Home / Self Care | Source: Ambulatory Visit | Attending: Neurological Surgery | Admitting: Neurological Surgery

## 2023-11-11 DIAGNOSIS — Z981 Arthrodesis status: Secondary | ICD-10-CM | POA: Diagnosis not present

## 2023-11-11 DIAGNOSIS — M4802 Spinal stenosis, cervical region: Secondary | ICD-10-CM

## 2023-11-11 DIAGNOSIS — M5412 Radiculopathy, cervical region: Secondary | ICD-10-CM | POA: Diagnosis not present

## 2023-11-24 DIAGNOSIS — L821 Other seborrheic keratosis: Secondary | ICD-10-CM | POA: Diagnosis not present

## 2023-11-24 DIAGNOSIS — L430 Hypertrophic lichen planus: Secondary | ICD-10-CM | POA: Diagnosis not present

## 2023-11-24 DIAGNOSIS — L723 Sebaceous cyst: Secondary | ICD-10-CM | POA: Diagnosis not present

## 2023-11-24 DIAGNOSIS — D485 Neoplasm of uncertain behavior of skin: Secondary | ICD-10-CM | POA: Diagnosis not present

## 2023-11-24 DIAGNOSIS — L814 Other melanin hyperpigmentation: Secondary | ICD-10-CM | POA: Diagnosis not present

## 2023-11-24 DIAGNOSIS — L239 Allergic contact dermatitis, unspecified cause: Secondary | ICD-10-CM | POA: Diagnosis not present

## 2023-11-24 DIAGNOSIS — D2239 Melanocytic nevi of other parts of face: Secondary | ICD-10-CM | POA: Diagnosis not present

## 2023-11-25 DIAGNOSIS — M4802 Spinal stenosis, cervical region: Secondary | ICD-10-CM | POA: Diagnosis not present

## 2023-11-26 ENCOUNTER — Telehealth: Payer: Self-pay | Admitting: Family Medicine

## 2023-11-26 NOTE — Telephone Encounter (Signed)
 Received Faxed document Surgical Clearance, to be filled out by provider. Patient requested to send it back via Fax . Document is located in providers tray at front office.Please advise

## 2023-11-27 NOTE — Telephone Encounter (Signed)
 Form faxed back.

## 2023-12-15 ENCOUNTER — Other Ambulatory Visit: Payer: Self-pay | Admitting: Neurological Surgery

## 2024-01-11 NOTE — Pre-Procedure Instructions (Signed)
 Surgical Instructions   Your procedure is scheduled on January 18, 2024. Report to [facility] at 12:20 P.M., then check in with the Admitting office. Any questions or running late day of surgery: call [phone]  Questions prior to your surgery date: call [phone], Monday-Friday, 8am-4pm. If you experience any cold or flu symptoms such as cough, fever, chills, shortness of breath, etc. between now and your scheduled surgery, please notify us  at the above number.     Remember:  Do not eat or drink after midnight the night before your surgery   Take these medicines the morning of surgery with A SIP OF WATER: cetirizine (ZYRTEC)  italopram (CELEXA )  progesterone  (PROMETRIUM )    Follow your surgeon's instructions on when to stop Aspirin.  If no instructions were given by your surgeon then you will need to call the office to get those instructions.     One week prior to surgery, STOP taking any Aleve, Naproxen, Ibuprofen , Motrin , Advil , Goody's, BC's, all herbal medications, fish oil, and non-prescription vitamins.                     Do NOT Smoke (Tobacco/Vaping) for 24 hours prior to your procedure.  If you use a CPAP at night, you may bring your mask/headgear for your overnight stay.   You will be asked to remove any contacts, glasses, piercing's, hearing aid's, dentures/partials prior to surgery. Please bring cases for these items if needed.    Patients discharged the day of surgery will not be allowed to drive home, and someone needs to stay with them for 24 hours.  SURGICAL WAITING ROOM VISITATION Patients may have no more than 2 support people in the waiting area - these visitors may rotate.   Pre-op nurse will coordinate an appropriate time for 1 ADULT support person, who may not rotate, to accompany patient in pre-op.  Children under the age of 23 must have an adult with them who is not the patient and must remain in the main waiting area with an  adult.  If the patient needs to stay at the hospital during part of their recovery, the visitor guidelines for inpatient rooms apply.  Please refer to the [facility] website for the visitor guidelines for any additional information.   If you received a COVID test during your pre-op visit  it is requested that you wear a mask when out in public, stay away from anyone that may not be feeling well and notify your surgeon if you develop symptoms. If you have been in contact with anyone that has tested positive in the last 10 days please notify you surgeon.      Pre-operative 5 CHG Bathing Instructions   You can play a key role in reducing the risk of infection after surgery. Your skin needs to be as free of germs as possible. You can reduce the number of germs on your skin by washing with CHG (chlorhexidine  gluconate) soap before surgery. CHG is an antiseptic soap that kills germs and continues to kill germs even after washing.   DO NOT use if you have an allergy to chlorhexidine /CHG or antibacterial soaps. If your skin becomes reddened or irritated, stop using the CHG and notify one of our RNs at [phone].   Please shower with the CHG soap starting 4 days before surgery using the following schedule:     Please keep in mind the following:  DO NOT shave, including legs and underarms, starting  the day of your first shower.   You may shave your face at any point before/day of surgery.  Place clean sheets on your bed the day you start using CHG soap. Use a clean washcloth (not used since being washed) for each shower. DO NOT sleep with pets once you start using the CHG.   CHG Shower Instructions:  Wash your face and private area with normal soap. If you choose to wash your hair, wash first with your normal shampoo.  After you use shampoo/soap, rinse your hair and body thoroughly to remove shampoo/soap residue.  Turn the water OFF and apply about 3 tablespoons (45 ml) of CHG soap to a  CLEAN washcloth.  Apply CHG soap ONLY FROM YOUR NECK DOWN TO YOUR TOES (washing for 3-5 minutes)  DO NOT use CHG soap on face, private areas, open wounds, or sores.  Pay special attention to the area where your surgery is being performed.  If you are having back surgery, having someone wash your back for you may be helpful. Wait 2 minutes after CHG soap is applied, then you may rinse off the CHG soap.  Pat dry with a clean towel  Put on clean clothes/pajamas   If you choose to wear lotion, please use ONLY the CHG-compatible lotions that are listed below.  Additional instructions for the day of surgery: DO NOT APPLY any lotions, deodorants, cologne, or perfumes.   Do not bring valuables to the hospital. [facility] is not responsible for any belongings/valuables. Do not wear nail polish, gel polish, artificial nails, or any other type of covering on natural nails (fingers and toes) Do not wear jewelry or makeup Put on clean/comfortable clothes.  Please brush your teeth.  Ask your nurse before applying any prescription medications to the skin.     CHG Compatible Lotions   Aveeno Moisturizing lotion  Cetaphil Moisturizing Cream  Cetaphil Moisturizing Lotion  Clairol Herbal Essence Moisturizing Lotion, Dry Skin  Clairol Herbal Essence Moisturizing Lotion, Extra Dry Skin  Clairol Herbal Essence Moisturizing Lotion, Normal Skin  Curel Age Defying Therapeutic Moisturizing Lotion with Alpha Hydroxy  Curel Extreme Care Body Lotion  Curel Soothing Hands Moisturizing Hand Lotion  Curel Therapeutic Moisturizing Cream, Fragrance-Free  Curel Therapeutic Moisturizing Lotion, Fragrance-Free  Curel Therapeutic Moisturizing Lotion, Original Formula  Eucerin Daily Replenishing Lotion  Eucerin Dry Skin Therapy Plus Alpha Hydroxy Crme  Eucerin Dry Skin Therapy Plus Alpha Hydroxy Lotion  Eucerin Original Crme  Eucerin Original Lotion  Eucerin Plus Crme Eucerin Plus Lotion  Eucerin TriLipid  Replenishing Lotion  Keri Anti-Bacterial Hand Lotion  Keri Deep Conditioning Original Lotion Dry Skin Formula Softly Scented  Keri Deep Conditioning Original Lotion, Fragrance Free Sensitive Skin Formula  Keri Lotion Fast Absorbing Fragrance Free Sensitive Skin Formula  Keri Lotion Fast Absorbing Softly Scented Dry Skin Formula  Keri Original Lotion  Keri Skin Renewal Lotion Keri Silky Smooth Lotion  Keri Silky Smooth Sensitive Skin Lotion  Nivea Body Creamy Conditioning Oil  Nivea Body Extra Enriched Lotion  Nivea Body Original Lotion  Nivea Body Sheer Moisturizing Lotion Nivea Crme  Nivea Skin Firming Lotion  NutraDerm 30 Skin Lotion  NutraDerm Skin Lotion  NutraDerm Therapeutic Skin Cream  NutraDerm Therapeutic Skin Lotion  ProShield Protective Hand Cream  Provon moisturizing lotion  Please read over the following fact sheets that you were given.

## 2024-01-12 ENCOUNTER — Other Ambulatory Visit: Payer: Self-pay

## 2024-01-12 ENCOUNTER — Encounter (HOSPITAL_COMMUNITY)
Admission: RE | Admit: 2024-01-12 | Discharge: 2024-01-12 | Disposition: A | Discharge status: Home / Self Care | Source: Ambulatory Visit | Attending: Neurological Surgery | Admitting: Neurological Surgery

## 2024-01-12 ENCOUNTER — Encounter (HOSPITAL_COMMUNITY): Payer: Self-pay

## 2024-01-12 VITALS — BP 119/72 | HR 72 | Temp 98.3°F | Resp 17 | Ht 64.0 in | Wt 128.5 lb

## 2024-01-12 DIAGNOSIS — Z01818 Encounter for other preprocedural examination: Secondary | ICD-10-CM | POA: Diagnosis present

## 2024-01-12 DIAGNOSIS — Z79890 Hormone replacement therapy (postmenopausal): Secondary | ICD-10-CM

## 2024-01-12 DIAGNOSIS — Z8659 Personal history of other mental and behavioral disorders: Secondary | ICD-10-CM

## 2024-01-12 HISTORY — DX: Prediabetes: R73.03

## 2024-01-12 LAB — TYPE AND SCREEN
ABO/RH(D): A POS
Antibody Screen: NEGATIVE

## 2024-01-12 LAB — CBC
HCT: 41.1 % (ref 36.0–46.0)
Hemoglobin: 13.9 g/dL (ref 12.0–15.0)
MCH: 32.2 pg (ref 26.0–34.0)
MCHC: 33.8 g/dL (ref 30.0–36.0)
MCV: 95.1 fL (ref 80.0–100.0)
Platelets: 228 10*3/uL (ref 150–400)
RBC: 4.32 MIL/uL (ref 3.87–5.11)
RDW: 12.8 % (ref 11.5–15.5)
WBC: 4.4 10*3/uL (ref 4.0–10.5)
nRBC: 0 % (ref 0.0–0.2)

## 2024-01-12 LAB — BASIC METABOLIC PANEL WITH GFR
Anion gap: 11 (ref 5–15)
BUN: 14 mg/dL (ref 8–23)
CO2: 21 mmol/L — ABNORMAL LOW (ref 22–32)
Calcium: 9.2 mg/dL (ref 8.9–10.3)
Chloride: 105 mmol/L (ref 98–111)
Creatinine, Ser: 0.86 mg/dL (ref 0.44–1.00)
GFR, Estimated: >60 mL/min (ref >60)
Glucose, Bld: 119 mg/dL — ABNORMAL HIGH (ref 70–99)
Potassium: 4.1 mmol/L (ref 3.5–5.1)
Sodium: 137 mmol/L (ref 135–145)

## 2024-01-12 LAB — SURGICAL PCR SCREEN
MRSA, PCR: NEGATIVE
Staphylococcus aureus: NEGATIVE

## 2024-01-12 MED ORDER — CITALOPRAM HYDROBROMIDE 20 MG PO TABS: ORAL_TABLET | ORAL | 0 refills | Status: DC

## 2024-01-12 MED ORDER — PROGESTERONE MICRONIZED 100 MG PO CAPS: 100.0000 mg | ORAL_CAPSULE | Freq: Every day | ORAL | 0 refills | Status: DC

## 2024-01-12 MED ORDER — ESTRADIOL 0.025 MG/24HR TD PTWK: 0.0250 mg | MEDICATED_PATCH | TRANSDERMAL | 0 refills | Status: AC

## 2024-01-12 NOTE — Telephone Encounter (Signed)
 Medication refill request: citalopram  20mg , estradiol  patch 0.025mg , prometrium  100mg  Last OV:  01-20-23 Last MMG (if hormonal medication request): 08-05-23 birads 1:neg Refill authorized: patient use to see Dr. Jertson. Patient will be seeing a new provider but can not get in yet to see them. Patient asking if she can get a supply to get her to her appt. Please approve or deny as appropriate

## 2024-01-12 NOTE — Progress Notes (Signed)
 PCP - Dr. Christel Cousins Cardiologist - Dr. Sheryle Donning - Last office visit 09/22/2022 with PRN follow-up. Pt was evaluated due to strong family hx of cardiac disease. Work-up normal  PPM/ICD - Denies Device Orders - n/a Rep Notified - n/a  Chest x-ray - n/a EKG - 08/27/2022 Stress Test - Denies ECHO - 01/02/2021 Cardiac Cath - Denies  Sleep Study - Denies CPAP - n/a  Pt is Pre-DM  Last dose of GLP1 agonist- n/a GLP1 instructions: n/a  Blood Thinner Instructions: n/a Aspirin Instructions: Pt has already stopped her ASA. Last dose was June 16th  NPO after midnight  COVID TEST- n/a   Anesthesia review: No   Patient denies shortness of breath, fever, cough and chest pain at PAT appointment. Pt denies any respiratory illness/infection in the last two months.   All instructions explained to the patient, with a verbal understanding of the material. Patient agrees to go over the instructions while at home for a better understanding. Patient also instructed to self quarantine after being tested for COVID-19. The opportunity to ask questions was provided.

## 2024-01-14 ENCOUNTER — Other Ambulatory Visit: Payer: Self-pay

## 2024-01-14 DIAGNOSIS — Z79890 Hormone replacement therapy (postmenopausal): Secondary | ICD-10-CM

## 2024-01-14 DIAGNOSIS — Z8659 Personal history of other mental and behavioral disorders: Secondary | ICD-10-CM

## 2024-01-14 MED ORDER — PROGESTERONE MICRONIZED 100 MG PO CAPS: 100.0000 mg | ORAL_CAPSULE | Freq: Every day | ORAL | 0 refills | Status: AC

## 2024-01-14 MED ORDER — CITALOPRAM HYDROBROMIDE 20 MG PO TABS: 20.0000 mg | ORAL_TABLET | Freq: Every day | ORAL | 0 refills | Status: AC

## 2024-01-14 NOTE — Telephone Encounter (Signed)
 Med refill request: progesterone  100 mg Last OV: 01/20/23 Dr. Jertson  LAST B&P 12/27/21 Dr. Jertson Next AEX: none scheduled Last MMG (if hormonal med) 08/05/23 BI-RADS 1 negative Note sent to FD to schedule B&P Refill authorized: needs appointment Please approve or deny as appropriate.

## 2024-01-14 NOTE — Telephone Encounter (Signed)
 Med refill request: citalopram  HBR 20 mg Last OV: 01/20/23 Dr. Jertson  LAST B&P 12/27/21 Dr. Jertson Next AEX: none scheduled Last MMG (if hormonal med) 08/05/23 BI-RADS 1 negative Note sent to FD to schedule B&P Refill authorized: needs appointment Please approve or deny as appropriate

## 2024-01-15 DIAGNOSIS — M4802 Spinal stenosis, cervical region: Secondary | ICD-10-CM | POA: Diagnosis not present

## 2024-01-18 ENCOUNTER — Encounter (HOSPITAL_COMMUNITY)
Admission: RE | Disposition: A | Discharge status: Home Health | Payer: Self-pay | Source: Home / Self Care | Attending: Neurological Surgery

## 2024-01-18 ENCOUNTER — Encounter (HOSPITAL_COMMUNITY): Payer: Self-pay | Admitting: Neurological Surgery

## 2024-01-18 ENCOUNTER — Other Ambulatory Visit: Payer: Self-pay

## 2024-01-18 ENCOUNTER — Inpatient Hospital Stay (HOSPITAL_COMMUNITY)
Admission: RE | Admit: 2024-01-18 | Discharge: 2024-01-19 | DRG: 430 | Disposition: A | Discharge status: Home Health | Attending: Neurological Surgery | Admitting: Neurological Surgery

## 2024-01-18 ENCOUNTER — Inpatient Hospital Stay (HOSPITAL_COMMUNITY): Payer: Self-pay | Admitting: Anesthesiology

## 2024-01-18 ENCOUNTER — Inpatient Hospital Stay (HOSPITAL_COMMUNITY)

## 2024-01-18 DIAGNOSIS — M4802 Spinal stenosis, cervical region: Secondary | ICD-10-CM | POA: Diagnosis present

## 2024-01-18 DIAGNOSIS — Z818 Family history of other mental and behavioral disorders: Secondary | ICD-10-CM | POA: Diagnosis not present

## 2024-01-18 DIAGNOSIS — Z833 Family history of diabetes mellitus: Secondary | ICD-10-CM | POA: Diagnosis not present

## 2024-01-18 DIAGNOSIS — R7303 Prediabetes: Secondary | ICD-10-CM | POA: Diagnosis not present

## 2024-01-18 DIAGNOSIS — Z800 Family history of malignant neoplasm of digestive organs: Secondary | ICD-10-CM

## 2024-01-18 DIAGNOSIS — Z7982 Long term (current) use of aspirin: Secondary | ICD-10-CM

## 2024-01-18 DIAGNOSIS — M4322 Fusion of spine, cervical region: Secondary | ICD-10-CM | POA: Diagnosis present

## 2024-01-18 DIAGNOSIS — Z85820 Personal history of malignant melanoma of skin: Secondary | ICD-10-CM

## 2024-01-18 DIAGNOSIS — Z801 Family history of malignant neoplasm of trachea, bronchus and lung: Secondary | ICD-10-CM

## 2024-01-18 DIAGNOSIS — Z79818 Long term (current) use of other agents affecting estrogen receptors and estrogen levels: Secondary | ICD-10-CM

## 2024-01-18 DIAGNOSIS — Z811 Family history of alcohol abuse and dependence: Secondary | ICD-10-CM | POA: Diagnosis not present

## 2024-01-18 DIAGNOSIS — Z8041 Family history of malignant neoplasm of ovary: Secondary | ICD-10-CM | POA: Diagnosis not present

## 2024-01-18 DIAGNOSIS — Z822 Family history of deafness and hearing loss: Secondary | ICD-10-CM

## 2024-01-18 DIAGNOSIS — Z841 Family history of disorders of kidney and ureter: Secondary | ICD-10-CM

## 2024-01-18 DIAGNOSIS — Z83438 Family history of other disorder of lipoprotein metabolism and other lipidemia: Secondary | ICD-10-CM | POA: Diagnosis not present

## 2024-01-18 DIAGNOSIS — M4712 Other spondylosis with myelopathy, cervical region: Secondary | ICD-10-CM | POA: Diagnosis not present

## 2024-01-18 DIAGNOSIS — M4722 Other spondylosis with radiculopathy, cervical region: Secondary | ICD-10-CM | POA: Diagnosis not present

## 2024-01-18 DIAGNOSIS — Z813 Family history of other psychoactive substance abuse and dependence: Secondary | ICD-10-CM | POA: Diagnosis not present

## 2024-01-18 DIAGNOSIS — Z79899 Other long term (current) drug therapy: Secondary | ICD-10-CM

## 2024-01-18 DIAGNOSIS — Z832 Family history of diseases of the blood and blood-forming organs and certain disorders involving the immune mechanism: Secondary | ICD-10-CM

## 2024-01-18 DIAGNOSIS — Z882 Allergy status to sulfonamides status: Secondary | ICD-10-CM | POA: Diagnosis not present

## 2024-01-18 DIAGNOSIS — Z8261 Family history of arthritis: Secondary | ICD-10-CM

## 2024-01-18 DIAGNOSIS — E785 Hyperlipidemia, unspecified: Secondary | ICD-10-CM | POA: Diagnosis not present

## 2024-01-18 DIAGNOSIS — Z820 Family history of epilepsy and other diseases of the nervous system: Secondary | ICD-10-CM

## 2024-01-18 DIAGNOSIS — Z8249 Family history of ischemic heart disease and other diseases of the circulatory system: Secondary | ICD-10-CM

## 2024-01-18 DIAGNOSIS — Z83518 Family history of other specified eye disorder: Secondary | ICD-10-CM | POA: Diagnosis not present

## 2024-01-18 DIAGNOSIS — Z981 Arthrodesis status: Secondary | ICD-10-CM | POA: Diagnosis not present

## 2024-01-18 DIAGNOSIS — M5002 Cervical disc disorder with myelopathy, mid-cervical region, unspecified level: Secondary | ICD-10-CM | POA: Diagnosis not present

## 2024-01-18 DIAGNOSIS — M5012 Mid-cervical disc disorder, unspecified level: Secondary | ICD-10-CM | POA: Diagnosis not present

## 2024-01-18 DIAGNOSIS — G959 Disease of spinal cord, unspecified: Principal | ICD-10-CM | POA: Diagnosis present

## 2024-01-18 HISTORY — PX: ANTERIOR CERVICAL DECOMP/DISCECTOMY FUSION: SHX1161

## 2024-01-18 LAB — ABO/RH: ABO/RH(D): A POS

## 2024-01-18 SURGERY — ANTERIOR CERVICAL DECOMPRESSION/DISCECTOMY FUSION 3 LEVELS
Anesthesia: General

## 2024-01-18 MED ORDER — MENTHOL 3 MG MT LOZG: 1.0000 | LOZENGE | OROMUCOSAL | Status: DC | PRN

## 2024-01-18 MED ORDER — MIDAZOLAM HCL 2 MG/2ML IJ SOLN
INTRAMUSCULAR | Status: DC | PRN
  Administered 2024-01-18: 2 mg via INTRAVENOUS

## 2024-01-18 MED ORDER — CHLORHEXIDINE GLUCONATE 0.12 % MT SOLN
OROMUCOSAL | Status: AC
  Administered 2024-01-18: 15 mL via OROMUCOSAL
  Filled 2024-01-18: qty 15

## 2024-01-18 MED ORDER — METHOCARBAMOL 500 MG PO TABS
ORAL_TABLET | ORAL | Status: AC
  Filled 2024-01-18: qty 1

## 2024-01-18 MED ORDER — PROPOFOL 10 MG/ML IV BOLUS
INTRAVENOUS | Status: AC
Start: 2024-01-18 — End: 2024-01-18
  Filled 2024-01-18: qty 20

## 2024-01-18 MED ORDER — ACETAMINOPHEN 650 MG RE SUPP: 650.0000 mg | RECTAL | Status: DC | PRN

## 2024-01-18 MED ORDER — PHENOL 1.4 % MT LIQD: 1.0000 | OROMUCOSAL | Status: DC | PRN

## 2024-01-18 MED ORDER — LIDOCAINE 2% (20 MG/ML) 5 ML SYRINGE
INTRAMUSCULAR | Status: AC
Start: 2024-01-18 — End: 2024-01-18
  Filled 2024-01-18: qty 5

## 2024-01-18 MED ORDER — LIDOCAINE 2% (20 MG/ML) 5 ML SYRINGE
INTRAMUSCULAR | Status: DC | PRN
  Administered 2024-01-18: 60 mg via INTRAVENOUS

## 2024-01-18 MED ORDER — DEXAMETHASONE SODIUM PHOSPHATE 10 MG/ML IJ SOLN
INTRAMUSCULAR | Status: DC | PRN
  Administered 2024-01-18: 10 mg via INTRAVENOUS

## 2024-01-18 MED ORDER — THROMBIN 5000 UNITS EX KIT
PACK | CUTANEOUS | Status: AC
  Filled 2024-01-18: qty 1

## 2024-01-18 MED ORDER — ESTRADIOL 0.025 MG/24HR TD PTWK: 0.0250 mg | MEDICATED_PATCH | TRANSDERMAL | Status: DC

## 2024-01-18 MED ORDER — 0.9 % SODIUM CHLORIDE (POUR BTL) OPTIME
TOPICAL | Status: DC | PRN
Start: 2024-01-18 — End: 2024-01-18
  Administered 2024-01-18: 1000 mL

## 2024-01-18 MED ORDER — ACETAMINOPHEN 10 MG/ML IV SOLN
INTRAVENOUS | Status: AC
  Filled 2024-01-18: qty 100

## 2024-01-18 MED ORDER — DEXAMETHASONE SODIUM PHOSPHATE 10 MG/ML IJ SOLN
INTRAMUSCULAR | Status: AC
Start: 2024-01-18 — End: 2024-01-18
  Filled 2024-01-18: qty 1

## 2024-01-18 MED ORDER — ALUM & MAG HYDROXIDE-SIMETH 200-200-20 MG/5ML PO SUSP
30.0000 mL | Freq: Four times a day (QID) | ORAL | Status: DC | PRN
Start: 2024-01-18 — End: 2024-01-19

## 2024-01-18 MED ORDER — LORATADINE 10 MG PO TABS
10.0000 mg | ORAL_TABLET | Freq: Every day | ORAL | Status: DC
  Administered 2024-01-18: 10 mg via ORAL
  Filled 2024-01-18: qty 1

## 2024-01-18 MED ORDER — SODIUM CHLORIDE 0.9% FLUSH: 3.0000 mL | INTRAVENOUS | Status: DC | PRN

## 2024-01-18 MED ORDER — FENTANYL CITRATE (PF) 250 MCG/5ML IJ SOLN
INTRAMUSCULAR | Status: AC
  Filled 2024-01-18: qty 5

## 2024-01-18 MED ORDER — THROMBIN 5000 UNITS EX SOLR
OROMUCOSAL | Status: DC | PRN
  Administered 2024-01-18: 5 mL via TOPICAL

## 2024-01-18 MED ORDER — MIDAZOLAM HCL 2 MG/2ML IJ SOLN
INTRAMUSCULAR | Status: AC
  Filled 2024-01-18: qty 2

## 2024-01-18 MED ORDER — ONDANSETRON HCL 4 MG PO TABS
4.0000 mg | ORAL_TABLET | Freq: Four times a day (QID) | ORAL | Status: DC | PRN
  Administered 2024-01-19: 4 mg via ORAL
  Filled 2024-01-18: qty 1

## 2024-01-18 MED ORDER — ONDANSETRON HCL 4 MG/2ML IJ SOLN: 4.0000 mg | Freq: Once | INTRAMUSCULAR | Status: DC | PRN

## 2024-01-18 MED ORDER — METHOCARBAMOL 1000 MG/10ML IJ SOLN
500.0000 mg | Freq: Four times a day (QID) | INTRAMUSCULAR | Status: DC | PRN
  Filled 2024-01-18: qty 5

## 2024-01-18 MED ORDER — FENTANYL CITRATE (PF) 250 MCG/5ML IJ SOLN
INTRAMUSCULAR | Status: DC | PRN
Start: 2024-01-18 — End: 2024-01-18
  Administered 2024-01-18 (×3): 50 ug via INTRAVENOUS
  Administered 2024-01-18: 100 ug via INTRAVENOUS

## 2024-01-18 MED ORDER — PHENYLEPHRINE HCL-NACL 20-0.9 MG/250ML-% IV SOLN
INTRAVENOUS | Status: DC | PRN
  Administered 2024-01-18: 20 ug/min via INTRAVENOUS

## 2024-01-18 MED ORDER — AMISULPRIDE (ANTIEMETIC) 5 MG/2ML IV SOLN: 10.0000 mg | Freq: Once | INTRAVENOUS | Status: DC | PRN

## 2024-01-18 MED ORDER — ROCURONIUM BROMIDE 10 MG/ML (PF) SYRINGE
PREFILLED_SYRINGE | INTRAVENOUS | Status: DC | PRN
  Administered 2024-01-18: 20 mg via INTRAVENOUS
  Administered 2024-01-18: 60 mg via INTRAVENOUS

## 2024-01-18 MED ORDER — ONDANSETRON HCL 4 MG/2ML IJ SOLN: 4.0000 mg | Freq: Four times a day (QID) | INTRAMUSCULAR | Status: DC | PRN

## 2024-01-18 MED ORDER — BISACODYL 10 MG RE SUPP: 10.0000 mg | Freq: Every day | RECTAL | Status: DC | PRN

## 2024-01-18 MED ORDER — ACETAMINOPHEN 10 MG/ML IV SOLN: 1000.0000 mg | Freq: Once | INTRAVENOUS | Status: DC | PRN

## 2024-01-18 MED ORDER — CITALOPRAM HYDROBROMIDE 20 MG PO TABS
20.0000 mg | ORAL_TABLET | Freq: Every day | ORAL | Status: DC
  Administered 2024-01-18: 20 mg via ORAL
  Filled 2024-01-18: qty 1

## 2024-01-18 MED ORDER — ACETAMINOPHEN 325 MG PO TABS: 650.0000 mg | ORAL_TABLET | ORAL | Status: DC | PRN

## 2024-01-18 MED ORDER — CHLORHEXIDINE GLUCONATE CLOTH 2 % EX PADS: 6.0000 | MEDICATED_PAD | Freq: Once | CUTANEOUS | Status: DC

## 2024-01-18 MED ORDER — HYDROMORPHONE HCL 1 MG/ML IJ SOLN: 0.5000 mg | INTRAMUSCULAR | Status: DC | PRN

## 2024-01-18 MED ORDER — OXYCODONE-ACETAMINOPHEN 5-325 MG PO TABS
1.0000 | ORAL_TABLET | ORAL | Status: DC | PRN
  Administered 2024-01-18: 1 via ORAL
  Filled 2024-01-18: qty 1

## 2024-01-18 MED ORDER — EPHEDRINE SULFATE-NACL 50-0.9 MG/10ML-% IV SOSY
PREFILLED_SYRINGE | INTRAVENOUS | Status: DC | PRN
  Administered 2024-01-18: 5 mg via INTRAVENOUS

## 2024-01-18 MED ORDER — BUPIVACAINE HCL (PF) 0.5 % IJ SOLN
INTRAMUSCULAR | Status: AC
  Filled 2024-01-18: qty 30

## 2024-01-18 MED ORDER — THROMBIN 5000 UNITS EX KIT
PACK | CUTANEOUS | Status: AC
Start: 2024-01-18 — End: 2024-01-18
  Filled 2024-01-18: qty 1

## 2024-01-18 MED ORDER — DOXYLAMINE SUCCINATE (SLEEP) 25 MG PO TABS
25.0000 mg | ORAL_TABLET | Freq: Every day | ORAL | Status: DC
  Filled 2024-01-18 (×2): qty 1

## 2024-01-18 MED ORDER — FENTANYL CITRATE (PF) 100 MCG/2ML IJ SOLN
25.0000 ug | INTRAMUSCULAR | Status: DC | PRN
  Administered 2024-01-18: 50 ug via INTRAVENOUS

## 2024-01-18 MED ORDER — SODIUM CHLORIDE 0.9% FLUSH
3.0000 mL | Freq: Two times a day (BID) | INTRAVENOUS | Status: DC
  Administered 2024-01-18: 3 mL via INTRAVENOUS

## 2024-01-18 MED ORDER — ACETAMINOPHEN 10 MG/ML IV SOLN
INTRAVENOUS | Status: DC | PRN
  Administered 2024-01-18: 1000 mg via INTRAVENOUS

## 2024-01-18 MED ORDER — SUGAMMADEX SODIUM 200 MG/2ML IV SOLN
INTRAVENOUS | Status: DC | PRN
  Administered 2024-01-18: 200 mg via INTRAVENOUS

## 2024-01-18 MED ORDER — CEFAZOLIN SODIUM-DEXTROSE 2-4 GM/100ML-% IV SOLN
2.0000 g | INTRAVENOUS | Status: AC
  Administered 2024-01-18: 2 g via INTRAVENOUS

## 2024-01-18 MED ORDER — CHLORHEXIDINE GLUCONATE 0.12 % MT SOLN: 15.0000 mL | Freq: Once | OROMUCOSAL | Status: AC

## 2024-01-18 MED ORDER — ONDANSETRON HCL 4 MG/2ML IJ SOLN
INTRAMUSCULAR | Status: DC | PRN
  Administered 2024-01-18: 4 mg via INTRAVENOUS

## 2024-01-18 MED ORDER — DOCUSATE SODIUM 100 MG PO CAPS
100.0000 mg | ORAL_CAPSULE | Freq: Two times a day (BID) | ORAL | Status: DC
  Administered 2024-01-18: 100 mg via ORAL
  Filled 2024-01-18: qty 1

## 2024-01-18 MED ORDER — FLEET ENEMA RE ENEM: 1.0000 | ENEMA | Freq: Once | RECTAL | Status: DC | PRN

## 2024-01-18 MED ORDER — ORAL CARE MOUTH RINSE: 15.0000 mL | Freq: Once | OROMUCOSAL | Status: AC

## 2024-01-18 MED ORDER — LIDOCAINE-EPINEPHRINE 1 %-1:100000 IJ SOLN
INTRAMUSCULAR | Status: DC | PRN
Start: 2024-01-18 — End: 2024-01-18
  Administered 2024-01-18: 3 mL

## 2024-01-18 MED ORDER — PROPOFOL 10 MG/ML IV BOLUS
INTRAVENOUS | Status: DC | PRN
  Administered 2024-01-18: 100 mg via INTRAVENOUS
  Administered 2024-01-18: 50 ug/kg/min via INTRAVENOUS
  Administered 2024-01-18: 50 mg via INTRAVENOUS

## 2024-01-18 MED ORDER — SENNA 8.6 MG PO TABS
1.0000 | ORAL_TABLET | Freq: Two times a day (BID) | ORAL | Status: DC
Start: 2024-01-18 — End: 2024-01-19
  Administered 2024-01-18: 8.6 mg via ORAL
  Filled 2024-01-18: qty 1

## 2024-01-18 MED ORDER — FENTANYL CITRATE (PF) 100 MCG/2ML IJ SOLN
INTRAMUSCULAR | Status: AC
  Filled 2024-01-18: qty 2

## 2024-01-18 MED ORDER — ONDANSETRON HCL 4 MG/2ML IJ SOLN
INTRAMUSCULAR | Status: AC
  Filled 2024-01-18: qty 2

## 2024-01-18 MED ORDER — SODIUM CHLORIDE 0.9 % IV SOLN: 250.0000 mL | INTRAVENOUS | Status: DC

## 2024-01-18 MED ORDER — LACTATED RINGERS IV SOLN: INTRAVENOUS | Status: DC

## 2024-01-18 MED ORDER — LIDOCAINE-EPINEPHRINE 1 %-1:100000 IJ SOLN
INTRAMUSCULAR | Status: AC
  Filled 2024-01-18: qty 1

## 2024-01-18 MED ORDER — ALBUMIN HUMAN 5 % IV SOLN: INTRAVENOUS | Status: DC | PRN

## 2024-01-18 MED ORDER — ROCURONIUM BROMIDE 10 MG/ML (PF) SYRINGE
PREFILLED_SYRINGE | INTRAVENOUS | Status: AC
  Filled 2024-01-18: qty 10

## 2024-01-18 MED ORDER — BUPIVACAINE HCL (PF) 0.5 % IJ SOLN
INTRAMUSCULAR | Status: DC | PRN
  Administered 2024-01-18: 3 mL

## 2024-01-18 MED ORDER — PROGESTERONE MICRONIZED 100 MG PO CAPS
100.0000 mg | ORAL_CAPSULE | Freq: Every day | ORAL | Status: DC
  Filled 2024-01-18: qty 1

## 2024-01-18 MED ORDER — POLYETHYLENE GLYCOL 3350 17 G PO PACK: 17.0000 g | PACK | Freq: Every day | ORAL | Status: DC | PRN

## 2024-01-18 MED ORDER — OXYCODONE-ACETAMINOPHEN 5-325 MG PO TABS
1.0000 | ORAL_TABLET | ORAL | Status: DC | PRN
  Administered 2024-01-19 (×3): 2 via ORAL
  Filled 2024-01-18 (×3): qty 2

## 2024-01-18 MED ORDER — CEFAZOLIN SODIUM-DEXTROSE 2-4 GM/100ML-% IV SOLN
INTRAVENOUS | Status: AC
  Filled 2024-01-18: qty 100

## 2024-01-18 MED ORDER — METHOCARBAMOL 500 MG PO TABS
500.0000 mg | ORAL_TABLET | Freq: Four times a day (QID) | ORAL | Status: DC | PRN
  Administered 2024-01-18 – 2024-01-19 (×2): 500 mg via ORAL
  Filled 2024-01-18 (×2): qty 1

## 2024-01-18 MED ORDER — CEFAZOLIN SODIUM-DEXTROSE 2-4 GM/100ML-% IV SOLN
2.0000 g | Freq: Three times a day (TID) | INTRAVENOUS | Status: AC
  Administered 2024-01-18 – 2024-01-19 (×2): 2 g via INTRAVENOUS
  Filled 2024-01-18 (×2): qty 100

## 2024-01-18 SURGICAL SUPPLY — 49 items
ALLOGRAFT LORDOTIC CC 7X11X14 (Bone Implant) IMPLANT
ALLOGRAFT TRIAD LORDOTIC CC (Bone Implant) IMPLANT
BAG COUNTER SPONGE SURGICOUNT (BAG) ×2 IMPLANT
BAND RUBBER #18 3X1/16 STRL (MISCELLANEOUS) ×4 IMPLANT
BIT DRILL ACP 15 (DRILL) IMPLANT
BIT DRILL NEURO 2X3.1 SFT TUCH (MISCELLANEOUS) ×2 IMPLANT
BNDG GAUZE DERMACEA FLUFF 4 (GAUZE/BANDAGES/DRESSINGS) IMPLANT
BUR BARREL STRAIGHT FLUTE 4.0 (BURR) IMPLANT
CANISTER SUCTION 3000ML PPV (SUCTIONS) ×2 IMPLANT
DERMABOND ADVANCED .7 DNX12 (GAUZE/BANDAGES/DRESSINGS) ×2 IMPLANT
DRAPE LAPAROTOMY 100X72 PEDS (DRAPES) ×2 IMPLANT
DRAPE MICROSCOPE SLANT 54X150 (MISCELLANEOUS) IMPLANT
DURAPREP 6ML APPLICATOR 50/CS (WOUND CARE) ×2 IMPLANT
ELECT COATED BLADE 2.86 ST (ELECTRODE) ×2 IMPLANT
ELECTRODE REM PT RTRN 9FT ADLT (ELECTROSURGICAL) ×2 IMPLANT
GAUZE 4X4 16PLY ~~LOC~~+RFID DBL (SPONGE) IMPLANT
GLOVE BIOGEL PI IND STRL 8.5 (GLOVE) ×2 IMPLANT
GLOVE ECLIPSE 8.5 STRL (GLOVE) ×2 IMPLANT
GLOVE EXAM NITRILE XL STR (GLOVE) IMPLANT
GOWN STRL REUS W/ TWL LRG LVL3 (GOWN DISPOSABLE) IMPLANT
GOWN STRL REUS W/ TWL XL LVL3 (GOWN DISPOSABLE) ×2 IMPLANT
GOWN STRL REUS W/TWL 2XL LVL3 (GOWN DISPOSABLE) ×2 IMPLANT
HALTER HD/CHIN CERV TRACTION D (MISCELLANEOUS) ×2 IMPLANT
HEMOSTAT POWDER KIT SURGIFOAM (HEMOSTASIS) ×2 IMPLANT
INTERLOCK LORDOTIC CC 5X11X14M (Spacer) IMPLANT
KIT BASIN OR (CUSTOM PROCEDURE TRAY) ×2 IMPLANT
KIT TURNOVER KIT B (KITS) ×2 IMPLANT
NDL HYPO 22X1.5 SAFETY MO (MISCELLANEOUS) ×2 IMPLANT
NDL SPNL 22GX3.5 QUINCKE BK (NEEDLE) ×2 IMPLANT
NEEDLE HYPO 22X1.5 SAFETY MO (MISCELLANEOUS) ×1 IMPLANT
NEEDLE SPNL 22GX3.5 QUINCKE BK (NEEDLE) ×1 IMPLANT
NS IRRIG 1000ML POUR BTL (IV SOLUTION) ×2 IMPLANT
PACK LAMINECTOMY NEURO (CUSTOM PROCEDURE TRAY) ×2 IMPLANT
PAD ARMBOARD POSITIONER FOAM (MISCELLANEOUS) ×6 IMPLANT
PATTIES SURGICAL .5 X.5 (GAUZE/BANDAGES/DRESSINGS) ×2 IMPLANT
PATTIES SURGICAL .5 X1 (DISPOSABLE) ×2 IMPLANT
PATTIES SURGICAL 1X1 (DISPOSABLE) ×2 IMPLANT
PIN DISTRACTION 14MM (PIN) IMPLANT
PLATE ACP 1.9X52 3LVL (Plate) IMPLANT
SCREW ACP VA ST 3.5X15 (Screw) IMPLANT
SET WALTER ACTIVATION W/DRAPE (SET/KITS/TRAYS/PACK) ×2 IMPLANT
SPIKE FLUID TRANSFER (MISCELLANEOUS) ×2 IMPLANT
SPONGE INTESTINAL PEANUT (DISPOSABLE) ×2 IMPLANT
SUT VIC AB 4-0 RB1 18 (SUTURE) ×4 IMPLANT
SYR 30ML SLIP (SYRINGE) ×2 IMPLANT
TOWEL GREEN STERILE (TOWEL DISPOSABLE) ×2 IMPLANT
TOWEL GREEN STERILE FF (TOWEL DISPOSABLE) ×2 IMPLANT
TUBING FEATHERFLOW (TUBING) ×2 IMPLANT
WATER STERILE IRR 1000ML POUR (IV SOLUTION) ×2 IMPLANT

## 2024-01-18 NOTE — Anesthesia Preprocedure Evaluation (Addendum)
 Anesthesia Evaluation  Patient identified by MRN, date of birth, ID band Patient awake    Reviewed: Allergy & Precautions, NPO status , Patient's Chart, lab work & pertinent test results  History of Anesthesia Complications (+) PONV and history of anesthetic complications  Airway Mallampati: III  TM Distance: >3 FB Neck ROM: Limited    Dental no notable dental hx.    Pulmonary neg pulmonary ROS   Pulmonary exam normal        Cardiovascular negative cardio ROS Normal cardiovascular exam  ECHO: 1. Left ventricular ejection fraction, by estimation, is 60 to 65%. Left  ventricular ejection fraction by 3D volume is 63 %. The left ventricle has  normal function. The left ventricle has no regional wall motion  abnormalities. There is mild asymmetric  left ventricular hypertrophy of the basal-septal segment. Left ventricular  diastolic parameters were normal.  2. Right ventricular systolic function is normal. The right ventricular  size is normal. There is normal pulmonary artery systolic pressure. The  estimated right ventricular systolic pressure is 22.5 mmHg.  3. The mitral valve is normal in structure. Trivial mitral valve  regurgitation.  4. The aortic valve is tricuspid. There is mild thickening of the aortic  valve. Aortic valve regurgitation is not visualized. Mild aortic valve  sclerosis is present, with no evidence of aortic valve stenosis.  5. The inferior vena cava is normal in size with greater than 50%  respiratory variability, suggesting right atrial pressure of 3 mmHg.     Neuro/Psych  PSYCHIATRIC DISORDERS  Depression       GI/Hepatic negative GI ROS, Neg liver ROS,,,  Endo/Other  negative endocrine ROS    Renal/GU negative Renal ROS     Musculoskeletal  (+) Arthritis ,    Abdominal   Peds  Hematology negative hematology ROS (+)   Anesthesia Other Findings Spinal stenosis, cervical region   Reproductive/Obstetrics                             Anesthesia Physical Anesthesia Plan  ASA: 2  Anesthesia Plan: General   Post-op Pain Management:    Induction: Intravenous  PONV Risk Score and Plan: 4 or greater and Ondansetron , Dexamethasone , Midazolam , Propofol  infusion and Treatment may vary due to age or medical condition  Airway Management Planned: Oral ETT and Video Laryngoscope Planned  Additional Equipment:   Intra-op Plan:   Post-operative Plan: Extubation in OR  Informed Consent: I have reviewed the patients History and Physical, chart, labs and discussed the procedure including the risks, benefits and alternatives for the proposed anesthesia with the patient or authorized representative who has indicated his/her understanding and acceptance.     Dental advisory given  Plan Discussed with: CRNA  Anesthesia Plan Comments:        Anesthesia Quick Evaluation

## 2024-01-18 NOTE — H&P (Signed)
 Kendra Hoffman is an 68 y.o. female.   Chief Complaint: Neck shoulder and arm discomfort with decreased fine motor control HPI: Kendra Hoffman is a 68 year old individual who has had significant spondylitic disease in the past she underwent surgical decompression at C3-C4 several years ago by Dr. Rockney.  He had suggested some yearly follow-up but as the radicular symptoms she had had were decreasing she was seen here more recently when she has had an increase in symptoms of some fine motor control movement in the left upper extremity.  An MRI demonstrates that she has advanced cervical spondylitic disease at C4-5 C5-6 and C6-C7 with cord compression and foraminal stenosis.  After careful consideration of her options I advised that she should undergo surgical decompression and stabilization at those 3 levels.  She is now admitted for that procedure  Past Medical History:  Diagnosis Date   Allergic reaction to alpha-gal    Amenorrhea    Anemia    history of at age 35   Arthritis    bilateral hands   Breast nodule 09/2006   no mass, patient denies being told about this issue 09/18/2016   [facility]cer [facility])    melanoma   Cataract    Depression    situational, lost 3 sibling and parents in short period of time   Hx of melanoma of skin 2011   left foot    Mood disorder (HCC)    PONV (postoperative nausea and vomiting)    Pre-diabetes     Past Surgical History:  Procedure Laterality Date   BLEPHAROPLASTY Bilateral    BUNIONECTOMY Bilateral    CERVICAL SPINE SURGERY     COLONOSCOPY     DILATATION & CURETTAGE/HYSTEROSCOPY WITH MYOSURE N/A 09/22/2016   Procedure: DILATATION & CURETTAGE/HYSTEROSCOPY WITH MYOSURE (possible Myosure);  Surgeon: Kate Hargis Nearing, MD;  Location: [facility];  Service: Gynecology;  Laterality: N/A;  follow first case   LAPAROSCOPIC BILATERAL SALPINGO OOPHERECTOMY Bilateral 09/04/2015   Procedure: LAPAROSCOPIC BILATERAL SALPINGO OOPHORECTOMY with pelvic  washings;  Surgeon: Kate Hargis Nearing, MD;  Location: [facility];  Service: Gynecology;  Laterality: Bilateral;   MELANOMA EXCISION  07/28/2010   left foot    TUMOR REMOVAL Right    eye    Family History  Problem Relation Age of Onset   Macular degeneration Mother    Hypertension Mother    Dementia Mother    Atrial fibrillation Mother    Hyperlipidemia Mother    Arthritis Mother    Depression Mother    Hearing loss Mother    Heart disease Mother    Miscarriages / [place] Mother    Diabetes Father    Heart attack Father        x 2   Heart disease Father    Arthritis Father        mostly in back; shoulder   Hyperlipidemia Father    Hypertension Father    Kidney disease Father    Depression Father    Hearing loss Father    Renal Disease Father    Alcohol abuse Sister    Arthritis Sister    Depression Sister    Drug abuse Sister    Hearing loss Sister    Hypertension Sister    Learning disabilities Sister    Other Sister        blood disorder - not hemochromotosis but similar requiring regular phlebotomy   Ovarian cancer Sister 41   Cancer Sister  ovarian   Early death Sister    Alcohol abuse Brother    Deep vein thrombosis Brother        disable and immoble also nephew   Arthritis Brother    Drug abuse Brother    Early death Brother    Hypertension Brother    Heart attack Maternal Grandmother    Early death Maternal Grandmother    Colon cancer Maternal Grandfather 60   Cancer Maternal Grandfather    Early death Maternal Grandfather    Diabetes Paternal Grandmother    Heart attack Paternal Grandmother 81   Early death Paternal Grandmother    Hearing loss Paternal Grandmother    Heart disease Paternal Grandmother    Hypertension Paternal Grandmother    Throat cancer Paternal Grandfather        smoker   Cancer Paternal Grandfather    Depression Paternal Grandfather    Early death Paternal Grandfather    Lung cancer Other    Colon cancer Other     Esophageal cancer Neg Hx    Stomach cancer Neg Hx    Rectal cancer Neg Hx    Social History:  reports that she has never smoked. She has never used smokeless tobacco. She reports current alcohol use of about 4.0 standard drinks of alcohol per week. She reports that she does not use drugs.  Allergies:  Allergies  Allergen Reactions   Sulfa Antibiotics Other (See Comments)   Alpha-Gal Hives and Rash    GI Distress   Bactrim [Sulfamethoxazole-Trimethoprim] Rash   Sulfamethoxazole Rash    No medications prior to admission.    No results found for this or any previous visit (from the past 48 hours). No results found.  Review of Systems  Constitutional:  Positive for activity change.  Musculoskeletal:  Positive for neck pain and neck stiffness.  Neurological:  Positive for weakness and numbness.    Last menstrual period 07/28/2008. Physical Exam Constitutional:      Appearance: Normal appearance.  HENT:     Head: Normocephalic and atraumatic.     Right Ear: Tympanic membrane, ear canal and external ear normal.     Left Ear: Tympanic membrane, ear canal and external ear normal.     Nose: Nose normal.     Mouth/Throat:     Mouth: Mucous membranes are moist.     Pharynx: Oropharynx is clear.   Eyes:     Extraocular Movements: Extraocular movements intact.     Conjunctiva/sclera: Conjunctivae normal.     Pupils: Pupils are equal, round, and reactive to light.   Neck:     Comments: Range of motion limited to 45 degrees turning to the left into the right flexion extension is about 80% of normal Cardiovascular:     Rate and Rhythm: Normal rate and regular rhythm.     Pulses: Normal pulses.     Heart sounds: Normal heart sounds.  Pulmonary:     Effort: Pulmonary effort is normal.     Breath sounds: Normal breath sounds.  Abdominal:     General: Abdomen is flat.     Palpations: Abdomen is soft.   Musculoskeletal:        General: Normal range of motion.   Skin:     General: Skin is warm.     Capillary Refill: Capillary refill takes less than 2 seconds.   Neurological:     Mental Status: She is alert.     Comments: Mild weakness in the grip strength left  more than right.  Tone and bulk are intact intrinsic strength is intact.  Absent deep tendon reflexes in the biceps and triceps.  Psychiatric:        Mood and Affect: Mood normal.        Behavior: Behavior normal.        Thought Content: Thought content normal.        Judgment: Judgment normal.      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  History of fusion C3-C4.  Plan: Posterior decompression arthrodesis C4-5 C5-6 and C6-C7.  [name], MD 01/18/2024, 7:41 AM

## 2024-01-18 NOTE — Progress Notes (Signed)
**Note Kendra-Identified via Obfuscation**  Orthopedic Tech Progress Note Patient Details:  Kendra Hoffman Jan 16, 1956 [id number]  Ortho Devices Type of Ortho Device: Soft collar Ortho Device/Splint Location: NECK Ortho Device/Splint Interventions: Ordered   Post Interventions Patient Tolerated: Well Instructions Provided: Care of device  Delanna LITTIE Pac 01/18/2024, 7:34 PM

## 2024-01-18 NOTE — Interval H&P Note (Signed)
 History and Physical Interval Note:  01/18/2024 1:53 PM  Kendra Hoffman  has presented today for surgery, with the diagnosis of SPINAL STENOSIS, CERVICAL REGION.  The various methods of treatment have been discussed with the patient and family. After consideration of risks, benefits and other options for treatment, the patient has consented to  Procedure(s) with comments: ANTERIOR CERVICAL DECOMPRESSION/DISCECTOMY FUSION 3 LEVELS (N/A) - ACDF R54,R43,R32 as a surgical intervention.  The patient's history has been reviewed, patient examined, no change in status, stable for surgery.  I have reviewed the patient's chart and labs.  Questions were answered to the patient's satisfaction.     [name]

## 2024-01-18 NOTE — Transfer of Care (Signed)
 Immediate Anesthesia Transfer of Care Note  Patient: Kendra Hoffman  Procedure(s) Performed: ANTERIOR CERVICAL DECOMPRESSION/DISCECTOMY FUSION CERVICAL FOUR-FIVE, CERVICAL FIVE-SIX, CERVICAL SIX-SEVEN  Patient Location: PACU  Anesthesia Type:General  Level of Consciousness: drowsy  Airway & Oxygen Therapy: Patient Spontanous Breathing and Patient connected to face mask oxygen  Post-op Assessment: Report given to RN and Post -op Vital signs reviewed and stable  Post vital signs: Reviewed and stable  Last Vitals:  Vitals Value Taken Time  BP 137/80 01/18/24 17:07  Temp 98.2   Pulse 61 01/18/24 17:13  Resp 14 01/18/24 17:13  SpO2 96 % 01/18/24 17:13  Vitals shown include unfiled device data.  Last Pain:  Vitals:   01/18/24 1235  TempSrc:   PainSc: 0-No pain         Complications: No notable events documented.

## 2024-01-18 NOTE — Anesthesia Procedure Notes (Signed)
 Procedure Name: Intubation Date/Time: 01/18/2024 2:23 PM  Performed by: Tidwell, Tess, RNPre-anesthesia Checklist: Patient identified, Emergency Drugs available, Suction available and Patient being monitored Patient Re-evaluated:Patient Re-evaluated prior to induction Oxygen Delivery Method: Circle System Utilized Preoxygenation: Pre-oxygenation with 100% oxygen Induction Type: IV induction Ventilation: Mask ventilation without difficulty Laryngoscope Size: Glidescope and 3 Grade View: Grade I Tube type: Oral Tube size: 7.0 mm Number of attempts: 1 Airway Equipment and Method: Stylet Placement Confirmation: ETT inserted through vocal cords under direct vision, positive ETCO2 and breath sounds checked- equal and bilateral Secured at: 22 cm Tube secured with: Tape Dental Injury: Teeth and Oropharynx as per pre-operative assessment  Comments: Head and neck maintained in neutral position throughout induction and intubation.

## 2024-01-18 NOTE — Op Note (Signed)
 Date of surgery: 01/18/2024 Preoperative diagnosis: Spondylosis with stenosis C4-5 C5-6 and C6-7 cervical myelopathy, cervical radiculopathy. Postoperative diagnosis: Same Procedure: Anterior cervical decompression C4-5 C5-6 C6-C7 arthrodesis with structural allograft and anterior plate fixation R5-R2 Surgeon: Victory Gens Anesthesia: General endotracheal Indications: Kendra Hoffman is a 68 year old individual whose had progressive worsening neck shoulder and arm pain with some weakness more proximally she has evidence of severe spondylosis with cord compression at C4-5 and C5-6 lesser extent at C6-C7 as she has severe biforaminal stenosis at C5-6 also she is advised regarding the need for surgery to decompress C4-5 C5-6 and C6-7 she has had previous surgery about 3 years ago at the C3-4 level where she had a spondylitic myelopathy.  Procedure: Patient was brought to the operating room supine on the stretcher.  After the smooth induction of general tracheal anesthesia, she was carefully placed on a 5 pound halter traction.  The neck was prepped with alcohol DuraPrep and draped in a sterile fashion.  A transverse incision was created in the left side of the neck after infiltrating with 5 cc of lidocaine  mixed 50-50 with half percent Marcaine  1-100,000 epi the dissection was taken down through the platysma and the plane between the sternocleidomastoid and the strap muscles was dissected bluntly until the prevertebral space was reached.  First identifiable disc space was noted to be that of C4-C5.  Then the longus coli muscle was gently stripped away from the midline and some large lateral osteophytes were taken down with the [place] and then a high-speed drill.  The disc space at C4-5 was entered.  The disc was severely degenerated and desiccated.  A complete discectomy was then performed using a high-speed drill and a 2 mm dissector to remove substantial portions of the disc material and irregular  endplates.  As the interspace was opened and a self-retaining disc spreader could be placed into the wound and this allowed for better visualization.  There was a large central spur from the inferior margin of the body of C4 which was drilled away.  Then the lateral recesses were decompressed where there is substantial uncinate hypertrophy.  This was drilled away also and this exposed the path for the exiting C5 nerve roots bilaterally.  Once they are well decompressed hemostasis in the midline was obtained and this was verified.  Then a precut 6 mm structural allograft was placed into the interspace.  Some bone chips were placed into the lateral gutters on either side.  Attention was then turned to C5-6 where similar process was carried out here there was again substantial uncinate process hypertrophy which was drilled down worse on the right than on the left.  The C6 nerve roots were well decompressed central canal was well decompressed and here a 5 mm premanufactured at allograft was placed into the interval.  Then at C6-C7 a similar process was carried out and here a 6 mm x 14 x 11 mm allograft spacer was placed into the interval. We then placed in the anterior cervical plate measuring 52 mm in length and secured to the vertebrae of C4-C5-C6 and C7 with 3-1/2 x 15 mm screws 2 in each vertebrae.  Radiograph identified good position of the hardware and good position of the fixation.  The stasis in the soft tissues was then meticulously obtained and then the retractors were removed the wound was inspected carefully and the platysma was closed with 4-0 Vicryl in interrupted fashion and 4-0 Vicryl was used in the subcuticular skin.  Dermabond  was placed on the skin.  Blood loss was estimated at about 100 cc.

## 2024-01-19 ENCOUNTER — Encounter (HOSPITAL_COMMUNITY): Payer: Self-pay | Admitting: Neurological Surgery

## 2024-01-19 ENCOUNTER — Other Ambulatory Visit (HOSPITAL_COMMUNITY): Payer: Self-pay

## 2024-01-19 MED ORDER — DIAZEPAM 5 MG/ML PO CONC
5.0000 mg | Freq: Four times a day (QID) | ORAL | 0 refills | Status: DC | PRN
  Filled 2024-01-19: qty 30, 8d supply, fill #0

## 2024-01-19 MED ORDER — OXYCODONE-ACETAMINOPHEN 5-325 MG PO TABS
1.0000 | ORAL_TABLET | Freq: Four times a day (QID) | ORAL | 0 refills | Status: DC | PRN
  Filled 2024-01-19: qty 30, 5d supply, fill #0

## 2024-01-19 NOTE — Progress Notes (Signed)
Patient alert and oriented, voiding adequately, skin clean, dry and intact without evidence of skin break down, or symptoms of complications - no redness or edema noted, only slight tenderness at site.  Patient states pain is manageable at time of discharge. Patient has an appointment with MD in 3 weeks 

## 2024-01-19 NOTE — Plan of Care (Signed)
  Problem: Education: Goal: Knowledge of General Education information will improve Description: Including pain rating scale, medication(s)/side effects and non-pharmacologic comfort measures Outcome: Completed/Met   Problem: Health Behavior/Discharge Planning: Goal: Ability to manage health-related needs will improve Outcome: Completed/Met   Problem: Clinical Measurements: Goal: Ability to maintain clinical measurements within normal limits will improve Outcome: Completed/Met Goal: Will remain free from infection Outcome: Completed/Met Goal: Diagnostic test results will improve Outcome: Completed/Met Goal: Respiratory complications will improve Outcome: Completed/Met Goal: Cardiovascular complication will be avoided Outcome: Completed/Met   Problem: Activity: Goal: Risk for activity intolerance will decrease Outcome: Completed/Met   Problem: Nutrition: Goal: Adequate nutrition will be maintained Outcome: Completed/Met   Problem: Coping: Goal: Level of anxiety will decrease Outcome: Completed/Met   Problem: Elimination: Goal: Will not experience complications related to bowel motility Outcome: Completed/Met Goal: Will not experience complications related to urinary retention Outcome: Completed/Met   Problem: Pain Managment: Goal: General experience of comfort will improve and/or be controlled Outcome: Completed/Met   Problem: Safety: Goal: Ability to remain free from injury will improve Outcome: Completed/Met   Problem: Skin Integrity: Goal: Risk for impaired skin integrity will decrease Outcome: Completed/Met   Problem: Education: Goal: Ability to verbalize activity precautions or restrictions will improve Outcome: Completed/Met Goal: Knowledge of the prescribed therapeutic regimen will improve Outcome: Completed/Met Goal: Understanding of discharge needs will improve Outcome: Completed/Met   Problem: Activity: Goal: Ability to avoid complications of  mobility impairment will improve Outcome: Completed/Met Goal: Ability to tolerate increased activity will improve Outcome: Completed/Met Goal: Will remain free from falls Outcome: Completed/Met   Problem: Bowel/Gastric: Goal: Gastrointestinal status for postoperative course will improve Outcome: Completed/Met   Problem: Clinical Measurements: Goal: Ability to maintain clinical measurements within normal limits will improve Outcome: Completed/Met Goal: Postoperative complications will be avoided or minimized Outcome: Completed/Met Goal: Diagnostic test results will improve Outcome: Completed/Met   Problem: Pain Management: Goal: Pain level will decrease Outcome: Completed/Met   Problem: Skin Integrity: Goal: Will show signs of wound healing Outcome: Completed/Met   Problem: Bladder/Genitourinary: Goal: Urinary functional status for postoperative course will improve Outcome: Completed/Met

## 2024-01-19 NOTE — Discharge Instructions (Signed)
 Wound Care Leave incision open to air. You may shower. Do not scrub directly on incision.  Do not put any creams, lotions, or ointments on incision. Activity Walk each and every day, increasing distance each day. No lifting greater than 8 lbs.  Avoid excessive neck motion. No driving for 2 weeks; may ride as a passenger locally. Wear neck brace at all times except when showering.  If provided soft collar, may wear for comfort unless otherwise instructed. Diet Resume your normal diet.  Return to Work Will be discussed at you follow up appointment. Call Your Doctor If Any of These Occur Redness, drainage, or swelling at the wound.  Temperature greater than 101 degrees. Severe pain not relieved by pain medication. Increased difficulty swallowing. Incision starts to come apart. Follow Up Appt Call today for appointment in 3 weeks ([phone]) or for problems.  If you have any hardware placed in your spine, you will need an x-ray before your appointment.

## 2024-01-19 NOTE — TOC Transition Note (Signed)
 Transition of Care [facility]) - Discharge Note   Patient Details  Name: Kendra Hoffman MRN: [id number] Date of Birth: Feb 27, 1956  Transition of Care [facility] CM/SW Contact:  Andrez JULIANNA George, RN Phone Number: 01/19/2024, 2:14 PM   Clinical Narrative:     Pt is discharging home with home health services through [place]. Information on the AVS. [facility] will contact her for the first home visit. Pt has transportation home.  Final next level of care: Home w Home Health Services Barriers to Discharge: No Barriers Identified   Patient Goals and CMS Choice   CMS Medicare.gov Compare Post Acute Care list provided to:: Patient Choice offered to / list presented to : Patient      Discharge Placement                       Discharge Plan and Services Additional resources added to the After Visit Summary for                            [facility] Arranged: PT [facility] Agency: [facility] Date [facility] Agency Contacted: 01/19/24   Representative spoke with at [facility] Agency: Amy  Social Drivers of Health (SDOH) Interventions SDOH Screenings   Food Insecurity: No Food Insecurity (11/10/2022)  Housing: Low Risk  (11/10/2022)  Transportation Needs: No Transportation Needs (11/10/2022)  Utilities: Not At Risk (11/10/2022)  Alcohol Screen: Low Risk  (10/24/2021)  Depression (PHQ2-9): Low Risk  (08/31/2023)  Financial Resource Strain: Low Risk  (11/10/2022)  Physical Activity: Insufficiently Active (11/10/2022)  Social Connections: Socially Isolated (11/10/2022)  Stress: No Stress Concern Present (11/10/2022)  Tobacco Use: Low Risk  (01/18/2024)     Readmission Risk Interventions     No data to display

## 2024-01-19 NOTE — Evaluation (Signed)
 Occupational Therapy Evaluation Patient Details Name: Kendra Hoffman MRN: [id number] DOB: 1955/10/12 Today's Date: 01/19/2024   History of Present Illness   Pt is a 68 y/o female presenting on 6/23 for ACDF C4-7. PMH includes: prior cervical surgery, arthritis, anemia, melanoma, PONV     Clinical Impressions Patient admitted for above and presents with problem list below.  PTA pt was independent. Patient was educated on brace mgmt, cervical precautions, ADL compensatory techniques, AE/DME, mobility progression, safety and recommendations.  Today, pt demonstrated ability to complete bed mobility with modified independence, transfers with independence, functional mobility with supervision to modified independence, and ADLs with up to modified independence.  She does report mild numbness in R hand but overall functional. Pt nauseated and vomiting at end of session, RN present and aware.  Based on performance today, no further OT needs identified.  OT will sign off.       If plan is discharge home, recommend the following:   Assistance with cooking/housework;Assist for transportation     Functional Status Assessment         Equipment Recommendations   None recommended by OT     Recommendations for Other Services         Precautions/Restrictions   Precautions Precautions: Cervical Precaution Booklet Issued: Yes (comment) Recall of Precautions/Restrictions: Intact Required Braces or Orthoses: Cervical Brace Cervical Brace: Soft collar Restrictions Weight Bearing Restrictions Per Provider Order: No     Mobility Bed Mobility Overal bed mobility: Modified Independent                  Transfers Overall transfer level: Independent                        Balance Overall balance assessment: Mild deficits observed, not formally tested                                         ADL either performed or assessed with clinical  judgement   ADL Overall ADL's : Modified independent                                       General ADL Comments: able to manage ADLs after education of techniques wit modified independence     Vision   Vision Assessment?: No apparent visual deficits     Perception         Praxis         Pertinent Vitals/Pain Pain Assessment Pain Assessment: Faces Faces Pain Scale: Hurts a little bit Pain Location: neck, surgical site Pain Descriptors / Indicators: Discomfort, Operative site guarding Pain Intervention(s): Limited activity within patient's tolerance, Monitored during session, Repositioned     Extremity/Trunk Assessment Upper Extremity Assessment Upper Extremity Assessment: RUE deficits/detail (within cervical precautions) RUE Deficits / Details: mild numbness and tingling RUE Sensation: decreased light touch RUE Coordination: WNL   Lower Extremity Assessment Lower Extremity Assessment: Defer to PT evaluation   Cervical / Trunk Assessment Cervical / Trunk Assessment: Neck Surgery   Communication Communication Communication: No apparent difficulties   Cognition Arousal: Alert Behavior During Therapy: WFL for tasks assessed/performed Cognition: No apparent impairments  Following commands: Intact       Cueing  General Comments   Cueing Techniques: Verbal cues  pt nauseated at end of session, vomiting- RN present and aware   Exercises     Shoulder Instructions      Home Living Family/patient expects to be discharged to:: Private residence Living Arrangements: Alone   Type of Home: House Home Access: Stairs to enter Secretary/administrator of Steps: 3 Entrance Stairs-Rails: None Home Layout: One level     Bathroom Shower/Tub: Chief Strategy Officer: Handicapped height     Home Equipment: Information systems manager          Prior Functioning/Environment Prior Level of Function :  Independent/Modified Independent;Driving                    OT Problem List: Pain;Decreased knowledge of precautions   OT Treatment/Interventions:        OT Goals(Current goals can be found in the care plan section)   Acute Rehab OT Goals Patient Stated Goal: home OT Goal Formulation: With patient   OT Frequency:       Co-evaluation              AM-PAC OT 6 Clicks Daily Activity     Outcome Measure Help from another person eating meals?: None Help from another person taking care of personal grooming?: None Help from another person toileting, which includes using toliet, bedpan, or urinal?: None Help from another person bathing (including washing, rinsing, drying)?: None Help from another person to put on and taking off regular upper body clothing?: None Help from another person to put on and taking off regular lower body clothing?: None 6 Click Score: 24   End of Session Equipment Utilized During Treatment: Cervical collar Nurse Communication: Mobility status  Activity Tolerance: Patient tolerated treatment well Patient left: in bed;with call bell/phone within reach;with nursing/sitter in room  OT Visit Diagnosis: Other abnormalities of gait and mobility (R26.89);Pain Pain - part of body:  (neck)                Time: 9169-9144 OT Time Calculation (min): 25 min Charges:  OT General Charges $OT Visit: 1 Visit OT Evaluation $OT Eval Low Complexity: 1 Low OT Treatments $Self Care/Home Management : 8-22 mins  Etta NOVAK, OT Acute Rehabilitation Services Office [phone] Secure Chat Preferred    Etta GORMAN Hope 01/19/2024, 9:03 AM

## 2024-01-19 NOTE — Evaluation (Signed)
 Physical Therapy Evaluation and DISCHARGE  Patient Details Name: Kendra Hoffman MRN: [id number] DOB: 1956-02-07 Today's Date: 01/19/2024  History of Present Illness  Pt is a 68 y/o female presenting on 6/23 for ACDF C4-7. PMH includes: prior cervical surgery, arthritis, anemia, melanoma, PONV  Clinical Impression  Pt admitted with above. Pt mobilizing well with contact guard assist. Pt with good recall of cervical precautions. Pt c/o balance deficits and not feeling as steady.  Pt with no further acute PT needs at this time however would benefit from  home health PT to address balance concerns and stair negotiation as they do not have handrails. Pt also interested in neck strengthening exercises however this PT instructed pt to talk to surgeon before starting any exercises. Acute PT SIGNING OFF. Please reconsult if needed in future.       If plan is discharge home, recommend the following: A little help with walking and/or transfers;Help with stairs or ramp for entrance;Assist for transportation   Can travel by private vehicle        Equipment Recommendations None recommended by PT  Recommendations for Other Services       Functional Status Assessment       Precautions / Restrictions Precautions Precautions: Cervical Precaution Booklet Issued: Yes (comment) Recall of Precautions/Restrictions: Intact Required Braces or Orthoses: Cervical Brace Cervical Brace: Soft collar Restrictions Weight Bearing Restrictions Per Provider Order: No      Mobility  Bed Mobility Overal bed mobility: Modified Independent             General bed mobility comments: pt demonstrated log roll technique with HOB flat    Transfers Overall transfer level: Independent Equipment used: None               General transfer comment: no difficulty, guarded/cautious as expected post op day 1    Ambulation/Gait Ambulation/Gait assistance: Contact guard assist Gait Distance (Feet): 200  Feet Assistive device: None Gait Pattern/deviations: Step-through pattern Gait velocity: wfl Gait velocity interpretation: 1.31 - 2.62 ft/sec, indicative of limited community ambulator   General Gait Details: pt mildly unsteady with lateral sway but no overt LOB, pt denies numbness in LEs but demo's bilat mild drop foot  Stairs Stairs: Yes Stairs assistance: Contact guard assist Stair Management: Alternating pattern (with Hand held assist to mimic no handrails at home) Number of Stairs: 6 General stair comments: utilized L HHA to mimic home set up of 3STE without handrails  Wheelchair Mobility     Tilt Bed    Modified Rankin (Stroke Patients Only)       Balance Overall balance assessment: Mild deficits observed, not formally tested                                           Pertinent Vitals/Pain Pain Assessment Pain Assessment: Faces Faces Pain Scale: Hurts a little bit Pain Location: neck, surgical site Pain Descriptors / Indicators: Discomfort, Operative site guarding Pain Intervention(s): Monitored during session    Home Living Family/patient expects to be discharged to:: Private residence Living Arrangements: Alone Available Help at Discharge: Family;Available PRN/intermittently Type of Home: House Home Access: Stairs to enter Entrance Stairs-Rails: None Entrance Stairs-Number of Steps: 3   Home Layout: One level Home Equipment: Shower seat      Prior Function Prior Level of Function : Independent/Modified Independent;Driving  Extremity/Trunk Assessment   Upper Extremity Assessment Upper Extremity Assessment: Defer to OT evaluation RUE Deficits / Details: mild numbness and tingling RUE Sensation: decreased light touch RUE Coordination: WNL    Lower Extremity Assessment Lower Extremity Assessment: Generalized weakness    Cervical / Trunk Assessment Cervical / Trunk Assessment: Neck Surgery   Communication   Communication Communication: No apparent difficulties    Cognition Arousal: Alert Behavior During Therapy: WFL for tasks assessed/performed   PT - Cognitive impairments: No apparent impairments                         Following commands: Intact       Cueing Cueing Techniques: Verbal cues     General Comments General comments (skin integrity, edema, etc.): VSS    Exercises     Assessment/Plan    PT Assessment    PT Problem List         PT Treatment Interventions      PT Goals (Current goals can be found in the Care Plan section)  Acute Rehab PT Goals Patient Stated Goal: improve balance PT Goal Formulation: All assessment and education complete, DC therapy    Frequency       Co-evaluation               AM-PAC PT 6 Clicks Mobility  Outcome Measure Help needed turning from your back to your side while in a flat bed without using bedrails?: None Help needed moving from lying on your back to sitting on the side of a flat bed without using bedrails?: None Help needed moving to and from a bed to a chair (including a wheelchair)?: None Help needed standing up from a chair using your arms (e.g., wheelchair or bedside chair)?: None Help needed to walk in hospital room?: A Little Help needed climbing 3-5 steps with a railing? : A Little 6 Click Score: 22    End of Session Equipment Utilized During Treatment: Cervical collar Activity Tolerance: Patient tolerated treatment well Patient left: in bed;with call bell/phone within reach Nurse Communication: Mobility status PT Visit Diagnosis: Unsteadiness on feet (R26.81);Muscle weakness (generalized) (M62.81)    Time: 8991-8978 PT Time Calculation (min) (ACUTE ONLY): 13 min   Charges:   PT Evaluation $PT Eval Low Complexity: 1 Low   PT General Charges $$ ACUTE PT VISIT: 1 Visit         Norene Ames, PT, DPT Acute Rehabilitation Services Secure chat preferred Office #:  [phone]   Norene CHRISTELLA Ames 01/19/2024, 10:32 AM

## 2024-01-19 NOTE — Discharge Summary (Signed)
 Physician Discharge Summary  Patient ID: Kendra Hoffman MRN: [id number] DOB/AGE: [birth year] 68 y.o.  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 rate 16, height 5' 4 (1.626 m), weight 58.1 kg, last menstrual period 07/28/2008, SpO2 95%. Incision is clean and dry Station and gait are intact  Disposition: Discharge disposition: 01-Home or Self Care       Discharge Instructions     Call MD for:  redness, tenderness, or signs of infection (pain, swelling, redness, odor or green/yellow discharge around incision site)   Complete by: As directed    Call MD for:  severe uncontrolled pain   Complete by: As directed    Call MD for:  temperature >100.4   Complete by: As directed    Diet - low sodium heart healthy   Complete by: As directed    Discharge instructions   Complete by: As directed    Okay to shower. Do not apply salves or appointments to incision. No heavy lifting with the upper extremities greater than 10 pounds. May resume driving when not requiring pain medication and patient feels comfortable with doing so.   Incentive spirometry RT   Complete by: As directed    Increase activity slowly   Complete by: As directed    No wound care   Complete by: As directed       Allergies as of 01/19/2024       Reactions   Sulfa Antibiotics Other (See Comments)   Alpha-gal Hives, Rash   GI Distress   Bactrim [sulfamethoxazole-trimethoprim] Rash   Sulfamethoxazole Rash         Medication List     TAKE these medications    aspirin EC 81 MG tablet Take 81 mg by mouth every evening.   cetirizine 10 MG tablet Commonly known as: ZYRTEC Take by mouth.   citalopram  20 MG tablet Commonly known as: CELEXA  TAKE ONE TABLET BY MOUTH DAILY   diazepam 5 MG/ML solution Commonly known as: diazePAM Intensol Take 1 mL (5 mg total) by mouth every 6 (six) hours as needed for muscle spasms.   doxylamine (Sleep) 25 MG tablet Commonly known as: UNISOM Take 25 mg by mouth at bedtime.   estradiol  0.025 mg/24hr patch Commonly known as: CLIMARA  - Dosed in mg/24 hr PLACE 1 PATCH ONTO THE SKIN ONCE WEEKLY AS DIRECTED   Multi-Vitamin tablet Take 1 tablet by mouth daily.   oxyCODONE -acetaminophen  5-325 MG tablet Commonly known as: PERCOCET/ROXICET Take 1-2 tablets by mouth every 6 (six) hours as needed for severe pain (pain score 7-10).   progesterone  100 MG capsule Commonly known as: Prometrium  Take 1 capsule (100 mg total) by mouth daily.         Signed: Victory PARAS Kashawn Manzano 01/19/2024, 1:51 PM

## 2024-01-20 NOTE — Anesthesia Postprocedure Evaluation (Signed)
 Anesthesia Post Note  Patient: Kendra Hoffman  Procedure(s) Performed: ANTERIOR CERVICAL DECOMPRESSION/DISCECTOMY FUSION CERVICAL FOUR-FIVE, CERVICAL FIVE-SIX, CERVICAL SIX-SEVEN     Patient location during evaluation: PACU Anesthesia Type: General Level of consciousness: awake Pain management: pain level controlled Vital Signs Assessment: post-procedure vital signs reviewed and stable Respiratory status: spontaneous breathing, nonlabored ventilation and respiratory function stable Cardiovascular status: blood pressure returned to baseline and stable Postop Assessment: no apparent nausea or vomiting Anesthetic complications: no   No notable events documented.  Last Vitals:  Vitals:   01/19/24 0446 01/19/24 0751  BP: 128/75 (!) 142/77  Pulse: 66 67  Resp: 18 16  Temp: 37.1 C 37.1 C  SpO2: 100% 95%    Last Pain:  Vitals:   01/19/24 1214  TempSrc:   PainSc: 4                  Tennyson Kallen P Myla Mauriello

## 2024-02-05 DIAGNOSIS — M4802 Spinal stenosis, cervical region: Secondary | ICD-10-CM | POA: Diagnosis not present

## 2024-03-02 DIAGNOSIS — M546 Pain in thoracic spine: Secondary | ICD-10-CM | POA: Diagnosis not present

## 2024-03-22 DIAGNOSIS — Z0142 Encounter for cervical smear to confirm findings of recent normal smear following initial abnormal smear: Secondary | ICD-10-CM | POA: Diagnosis not present

## 2024-03-22 DIAGNOSIS — Z1151 Encounter for screening for human papillomavirus (HPV): Secondary | ICD-10-CM | POA: Diagnosis not present

## 2024-03-22 DIAGNOSIS — Z780 Asymptomatic menopausal state: Secondary | ICD-10-CM | POA: Diagnosis not present

## 2024-03-22 DIAGNOSIS — Z1272 Encounter for screening for malignant neoplasm of vagina: Secondary | ICD-10-CM | POA: Diagnosis not present

## 2024-03-22 DIAGNOSIS — Z01411 Encounter for gynecological examination (general) (routine) with abnormal findings: Secondary | ICD-10-CM | POA: Diagnosis not present

## 2024-03-22 DIAGNOSIS — F418 Other specified anxiety disorders: Secondary | ICD-10-CM | POA: Diagnosis not present

## 2024-03-22 DIAGNOSIS — Z79890 Hormone replacement therapy (postmenopausal): Secondary | ICD-10-CM | POA: Diagnosis not present

## 2024-03-22 DIAGNOSIS — Z809 Family history of malignant neoplasm, unspecified: Secondary | ICD-10-CM | POA: Diagnosis not present

## 2024-03-22 DIAGNOSIS — E2839 Other primary ovarian failure: Secondary | ICD-10-CM | POA: Diagnosis not present

## 2024-03-22 DIAGNOSIS — Z124 Encounter for screening for malignant neoplasm of cervix: Secondary | ICD-10-CM | POA: Diagnosis not present

## 2024-03-23 DIAGNOSIS — M545 Low back pain, unspecified: Secondary | ICD-10-CM | POA: Diagnosis not present

## 2024-03-23 DIAGNOSIS — M546 Pain in thoracic spine: Secondary | ICD-10-CM | POA: Diagnosis not present

## 2024-04-05 DIAGNOSIS — M545 Low back pain, unspecified: Secondary | ICD-10-CM | POA: Diagnosis not present

## 2024-04-05 DIAGNOSIS — M546 Pain in thoracic spine: Secondary | ICD-10-CM | POA: Diagnosis not present

## 2024-04-19 DIAGNOSIS — Z780 Asymptomatic menopausal state: Secondary | ICD-10-CM | POA: Diagnosis not present

## 2024-04-27 ENCOUNTER — Encounter

## 2024-04-27 NOTE — Progress Notes (Signed)
 This encounter was created in error - please disregard.

## 2024-04-28 DIAGNOSIS — L820 Inflamed seborrheic keratosis: Secondary | ICD-10-CM | POA: Diagnosis not present

## 2024-04-28 DIAGNOSIS — L570 Actinic keratosis: Secondary | ICD-10-CM | POA: Diagnosis not present

## 2024-05-03 DIAGNOSIS — M545 Low back pain, unspecified: Secondary | ICD-10-CM | POA: Diagnosis not present

## 2024-05-03 DIAGNOSIS — M546 Pain in thoracic spine: Secondary | ICD-10-CM | POA: Diagnosis not present

## 2024-05-23 DIAGNOSIS — M545 Low back pain, unspecified: Secondary | ICD-10-CM | POA: Diagnosis not present

## 2024-05-23 DIAGNOSIS — M546 Pain in thoracic spine: Secondary | ICD-10-CM | POA: Diagnosis not present

## 2024-06-09 ENCOUNTER — Ambulatory Visit

## 2024-06-09 VITALS — BP 138/68 | HR 84 | Temp 97.7°F | Wt 128.0 lb

## 2024-06-09 DIAGNOSIS — Z0000 Encounter for general adult medical examination without abnormal findings: Secondary | ICD-10-CM | POA: Diagnosis not present

## 2024-06-09 DIAGNOSIS — Z23 Encounter for immunization: Secondary | ICD-10-CM

## 2024-06-09 NOTE — Progress Notes (Signed)
 Chief Complaint  Patient presents with   Medicare Wellness     Subjective:   Kendra Hoffman is a 68 y.o. female who presents for a [employer] Visit.  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 and parents in short period of time   Hx of melanoma of skin 2011   left foot    Mood disorder    PONV (postoperative nausea and vomiting)    Pre-diabetes    Past Surgical History:  Procedure Laterality Date   ANTERIOR CERVICAL DECOMP/DISCECTOMY FUSION N/A 01/18/2024   Procedure: ANTERIOR CERVICAL DECOMPRESSION/DISCECTOMY FUSION CERVICAL FOUR-FIVE, CERVICAL FIVE-SIX, CERVICAL SIX-SEVEN;  Surgeon: Colon Shove, MD;  Location: [facility] OR;  Service: Neurosurgery;  Laterality: N/A;  ACDF R54,R43,R32   BLEPHAROPLASTY Bilateral    BUNIONECTOMY Bilateral    CERVICAL SPINE SURGERY     COLONOSCOPY     DILATATION & CURETTAGE/HYSTEROSCOPY WITH MYOSURE N/A 09/22/2016   Procedure: DILATATION & CURETTAGE/HYSTEROSCOPY WITH MYOSURE (possible Myosure);  Surgeon: Kate Hargis Nearing, MD;  Location: [facility] ORS;  Service: Gynecology;  Laterality: N/A;  follow first case   LAPAROSCOPIC BILATERAL SALPINGO OOPHERECTOMY Bilateral 09/04/2015   Procedure: LAPAROSCOPIC BILATERAL SALPINGO OOPHORECTOMY with pelvic washings;  Surgeon: Kate Hargis Nearing, MD;  Location: [facility] ORS;  Service: Gynecology;  Laterality: Bilateral;   MELANOMA EXCISION  07/28/2010   left foot    TUMOR REMOVAL Right    eye   Family History  Problem Relation Age of Onset   Macular degeneration Mother    Hypertension Mother     Dementia Mother    Atrial fibrillation Mother    Hyperlipidemia Mother    Arthritis Mother    Depression Mother    Hearing loss Mother    Heart disease Mother    Miscarriages / Stillbirths Mother    Diabetes Father    Heart attack Father        x 2   Heart disease Father    Arthritis Father        mostly in back; shoulder   Hyperlipidemia Father    Hypertension Father    Kidney disease Father    Depression Father    Hearing loss Father    Renal Disease Father    Alcohol abuse Sister    Arthritis Sister    Depression Sister    Drug abuse Sister    Hearing loss Sister    Hypertension Sister    Learning disabilities Sister    Other Sister        blood disorder - not hemochromotosis but similar requiring regular phlebotomy   Ovarian cancer Sister 55   Cancer Sister        ovarian   Early death Sister    Alcohol abuse Brother    Deep vein thrombosis Brother        disable and immoble also nephew   Arthritis Brother    Drug abuse Brother    Early death Brother    Hypertension Brother    Heart attack Maternal Grandmother  Early death Maternal Grandmother    Colon cancer Maternal Grandfather 71   Cancer Maternal Grandfather    Early death Maternal Grandfather    Diabetes Paternal Grandmother    Heart attack Paternal Grandmother 29   Early death Paternal Grandmother    Hearing loss Paternal Grandmother    Heart disease Paternal Grandmother    Hypertension Paternal Grandmother    Throat cancer Paternal Grandfather        smoker   Cancer Paternal Grandfather    Depression Paternal Grandfather    Early death Paternal Grandfather    Lung cancer Other    Colon cancer Other    Esophageal cancer Neg Hx    Stomach cancer Neg Hx    Rectal cancer Neg Hx    Social History   Occupational History   Not on file  Tobacco Use   Smoking status: Never   Smokeless tobacco: Never  Vaping Use   Vaping status: Never Used  Substance and Sexual Activity   Alcohol use: Yes     Alcohol/week: 4.0 standard drinks of alcohol    Types: 4 Glasses of wine per week   Drug use: No   Sexual activity: Not Currently    Partners: Male    Birth control/protection: Post-menopausal    Comment: BSO   Tobacco Counseling Counseling given: Not Answered  SDOH Screenings   Food Insecurity: No Food Insecurity (06/09/2024)  Housing: Unknown (06/09/2024)  Transportation Needs: No Transportation Needs (06/09/2024)  Utilities: Not At Risk (06/09/2024)  Alcohol Screen: Low Risk  (10/24/2021)  Depression (PHQ2-9): Low Risk  (06/09/2024)  Financial Resource Strain: Low Risk  (11/10/2022)  Physical Activity: Insufficiently Active (06/09/2024)  Social Connections: Moderately Isolated (06/09/2024)  Stress: No Stress Concern Present (06/09/2024)  Tobacco Use: Low Risk  (06/09/2024)  Health Literacy: Adequate Health Literacy (06/09/2024)   See flowsheets for full screening details  Depression Screen PHQ 2 & 9 Depression Scale- Over the past 2 weeks, how often have you been bothered by any of the following problems? Little interest or pleasure in doing things: 0 Feeling down, depressed, or hopeless (PHQ Adolescent also includes...irritable): 0 PHQ-2 Total Score: 0 Trouble falling or staying asleep, or sleeping too much: 0 Feeling tired or having little energy: 0 Poor appetite or overeating (PHQ Adolescent also includes...weight loss): 0 Feeling bad about yourself - or that you are a failure or have let yourself or your family down: 0 Trouble concentrating on things, such as reading the newspaper or watching television (PHQ Adolescent also includes...like school work): 0 Moving or speaking so slowly that other people could have noticed. Or the opposite - being so fidgety or restless that you have been moving around a lot more than usual: 0 Thoughts that you would be better off dead, or of hurting yourself in some way: 0 PHQ-9 Total Score: 0 If you checked off any problems, how  difficult have these problems made it for you to do your work, take care of things at home, or get along with other people?: Not difficult at all  Depression Treatment Depression Interventions/Treatment : EYV7-0 Score <4 Follow-up Not Indicated     Goals Addressed               This Visit's Progress     Patient Stated (pt-stated)        Exercise more        Visit info / Clinical Intake: Medicare Wellness Visit Type:: Subsequent Annual Wellness Visit Persons participating in visit:: patient  Medicare Wellness Visit Mode:: In-person (required for WTM) Information given by:: patient Interpreter Needed?: No Pre-visit prep was completed: yes AWV questionnaire completed by patient prior to visit?: no Living arrangements:: (!) lives alone Patient's Overall Health Status Rating: good Typical amount of pain: some Does pain affect daily life?: (!) yes (back and left hip pain) Are you currently prescribed opioids?: no  Dietary Habits and Nutritional Risks How many meals a day?: 3 Eats fruit and vegetables daily?: yes Most meals are obtained by: preparing own meals; eating out In the last 2 weeks, have you had any of the following?: (!) nausea, vomiting, diarrhea Diabetic:: (!) yes Any non-healing wounds?: no How often do you check your BS?: 0 Would you like to be referred to a Nutritionist or for Diabetic Management? : no  Functional Status Activities of Daily Living (to include ambulation/medication): Independent Ambulation: Independent with device- listed below Home Assistive Devices/Equipment: Eyeglasses Medication Administration: Independent Home Management: Independent Manage your own finances?: yes Primary transportation is: driving Concerns about vision?: no *vision screening is required for WTM* Concerns about hearing?: no  Fall Screening Falls in the past year?: 1 Number of falls in past year: 1 Was there an injury with Fall?: 1 Fall Risk Category Calculator:  3 Patient Fall Risk Level: High Fall Risk  Fall Risk Patient at Risk for Falls Due to: History of fall(s); Other (Comment) (fainted) Fall risk Follow up: Falls prevention discussed  Home and Transportation Safety: All rugs have non-skid backing?: (!) no All stairs or steps have railings?: N/A, no stairs Grab bars in the bathtub or shower?: (!) no Have non-skid surface in bathtub or shower?: yes Good home lighting?: yes Regular seat belt use?: yes Hospital stays in the last year:: (!) yes How many hospital stays:: 1 Reason: neck  Cognitive Assessment Difficulty concentrating, remembering, or making decisions? : no Will 6CIT or Mini Cog be Completed: no 6CIT or Mini Cog Declined: patient alert, oriented, able to answer questions appropriately and recall recent events  Advance Directives (For Healthcare) Does Patient Have a Medical Advance Directive?: Yes Type of Advance Directive: Healthcare Power of [place]; Living will Copy of Healthcare Power of Attorney in Chart?: Yes - validated most recent copy scanned in chart (See row information) Copy of Living Will in Chart?: Yes - validated most recent copy scanned in chart (See row information)  Reviewed/Updated  Reviewed/Updated: Reviewed All (Medical, Surgical, Family, Medications, Allergies, Care Teams, Patient Goals)        Objective:    There were no vitals filed for this visit. There is no height or weight on file to calculate BMI.  Current Medications (verified) Outpatient Encounter Medications as of 06/09/2024  Medication Sig   aspirin EC 81 MG tablet Take 81 mg by mouth every evening.    cetirizine (ZYRTEC) 10 MG tablet Take by mouth.   citalopram  (CELEXA ) 20 MG tablet TAKE ONE TABLET BY MOUTH DAILY   doxylamine , Sleep, (UNISOM ) 25 MG tablet Take 25 mg by mouth at bedtime.   estradiol  (CLIMARA  - DOSED IN MG/24 HR) 0.025 mg/24hr patch PLACE 1 PATCH ONTO THE SKIN ONCE WEEKLY AS DIRECTED   Multiple Vitamin  (MULTI-VITAMIN) tablet Take 1 tablet by mouth daily.   progesterone  (PROMETRIUM ) 100 MG capsule Take 1 capsule (100 mg total) by mouth daily.   [DISCONTINUED] diazepam  (DIAZEPAM  INTENSOL) 5 MG/ML solution Take 1 mL (5 mg total) by mouth every 6 (six) hours as needed for muscle spasms.   [DISCONTINUED] oxyCODONE -acetaminophen  (PERCOCET/ROXICET) 5-325 MG  tablet Take 1-2 tablets by mouth every 6 (six) hours as needed for severe pain (pain score 7-10).   No facility-administered encounter medications on file as of 06/09/2024.   Hearing/Vision screen Hearing Screening - Comments:: Pt denies any hearing issue  Vision Screening - Comments:: Wears rx glasses - up to date with routine eye exams with Dr Arlyss King  Immunizations and Health Maintenance Health Maintenance  Topic Date Due   Zoster Vaccines- Shingrix (1 of 2) Never done   Pneumococcal Vaccine: 50+ Years (1 of 1 - PCV) Never done   DTaP/Tdap/Td (3 - Td or Tdap) 11/17/2020   COVID-19 Vaccine (5 - 2025-26 season) 03/28/2024   Medicare Annual Wellness (AWV)  06/09/2025   Mammogram  08/04/2025   Colonoscopy  05/19/2032   Influenza Vaccine  Completed   DEXA SCAN  Completed   Hepatitis C Screening  Completed   Meningococcal B Vaccine  Aged Out        Assessment/Plan:  This is a routine wellness examination for [place].  Patient Care Team: Wendolyn Jenkins Jansky, MD as PCP - General (Family Medicine) Lonni Slain, MD as PCP - Cardiology (Cardiology) Jannis Kate Norris, MD as Consulting Physician (Obstetrics and Gynecology) Jewell, Jolene R, MD as Consulting Physician (Dermatology)  I have personally reviewed and noted the following in the patient's chart:   Medical and social history Use of alcohol, tobacco or illicit drugs  Current medications and supplements including opioid prescriptions. Functional ability and status Nutritional status Physical activity Advanced directives List of other  physicians Hospitalizations, surgeries, and ER visits in previous 12 months Vitals Screenings to include cognitive, depression, and falls Referrals and appointments  Orders Placed This Encounter  Procedures   Flu vaccine HIGH DOSE PF(Fluzone Trivalent)   In addition, I have reviewed and discussed with patient certain preventive protocols, quality metrics, and best practice recommendations. A written personalized care plan for preventive services as well as general preventive health recommendations were provided to patient.   Ellouise VEAR Haws, LPN   [id number]/[id number]   Return in 1 year (on 06/09/2025).  After Visit Summary: (In Person-Printed) AVS printed and given to the patient  Nurse Notes: pt will make an appt to follow up for fainting episodes

## 2024-06-09 NOTE — Patient Instructions (Signed)
 Ms. Laskin,  Thank you for taking the time for your Medicare Wellness Visit. I appreciate your continued commitment to your health goals. Please review the care plan we discussed, and feel free to reach out if I can assist you further.  Please note that Annual Wellness Visits do not include a physical exam. Some assessments may be limited, especially if the visit was conducted virtually. If needed, we may recommend an in-person follow-up with your provider.  Ongoing Care Seeing your primary care provider every 3 to 6 months helps us  monitor your health and provide consistent, personalized care.   Referrals If a referral was made during today's visit and you haven't received any updates within two weeks, please contact the referred provider directly to check on the status.  Recommended Screenings:  Health Maintenance  Topic Date Due   Zoster (Shingles) Vaccine (1 of 2) Never done   Pneumococcal Vaccine for age over 15 (1 of 1 - PCV) Never done   DTaP/Tdap/Td vaccine (3 - Td or Tdap) 11/17/2020   Medicare Annual Wellness Visit  11/10/2023   Flu Shot  02/26/2024   COVID-19 Vaccine (5 - 2025-26 season) 03/28/2024   Breast Cancer Screening  08/04/2025   Colon Cancer Screening  05/19/2032   DEXA scan (bone density measurement)  Completed   Hepatitis C Screening  Completed   Meningitis B Vaccine  Aged Out       01/12/2024    1:21 PM  Advanced Directives  Does Patient Have a Medical Advance Directive? Yes  Type of Estate Agent of [place];Living will  Copy of Healthcare Power of Attorney in Chart? Yes - validated most recent copy scanned in chart (See row information)    Vision: Annual vision screenings are recommended for early detection of glaucoma, cataracts, and diabetic retinopathy. These exams can also reveal signs of chronic conditions such as diabetes and high blood pressure.  Dental: Annual dental screenings help detect early signs of oral cancer, gum  disease, and other conditions linked to overall health, including heart disease and diabetes.  Please see the attached documents for additional preventive care recommendations.

## 2024-06-13 NOTE — Progress Notes (Signed)
 Chief Complaint  Patient presents with   Medicare Wellness     Subjective:   Kendra Hoffman is a 68 y.o. female who presents for a [employer] Visit.  Allergies (verified) Sulfa antibiotics, Alpha-gal, Bactrim [sulfamethoxazole-trimethoprim], and Sulfamethoxazole   History: Past Medical History:  Diagnosis Date   Allergic reaction to alpha-gal    Amenorrhea    Anemia    history of at age 12   Arthritis    bilateral hands   Breast nodule 09/2006   no mass, patient denies being told about this issue 09/18/2016   Cancer [facility])    melanoma   Cataract    Depression    situational, lost 3 sibling and parents in short period of time   Hx of melanoma of skin 2011   left foot    Mood disorder    PONV (postoperative nausea and vomiting)    Pre-diabetes    Past Surgical History:  Procedure Laterality Date   ANTERIOR CERVICAL DECOMP/DISCECTOMY FUSION N/A 01/18/2024   Procedure: ANTERIOR CERVICAL DECOMPRESSION/DISCECTOMY FUSION CERVICAL FOUR-FIVE, CERVICAL FIVE-SIX, CERVICAL SIX-SEVEN;  Surgeon: Colon Shove, MD;  Location: [facility] OR;  Service: Neurosurgery;  Laterality: N/A;  ACDF R54,R43,R32   BLEPHAROPLASTY Bilateral    BUNIONECTOMY Bilateral    CERVICAL SPINE SURGERY     COLONOSCOPY     DILATATION & CURETTAGE/HYSTEROSCOPY WITH MYOSURE N/A 09/22/2016   Procedure: DILATATION & CURETTAGE/HYSTEROSCOPY WITH MYOSURE (possible Myosure);  Surgeon: Kate Hargis Nearing, MD;  Location: [facility] ORS;  Service: Gynecology;  Laterality: N/A;  follow first case   LAPAROSCOPIC BILATERAL SALPINGO OOPHERECTOMY Bilateral 09/04/2015   Procedure: LAPAROSCOPIC BILATERAL SALPINGO OOPHORECTOMY with pelvic washings;  Surgeon: Kate Hargis Nearing, MD;  Location: [facility] ORS;  Service: Gynecology;  Laterality: Bilateral;   MELANOMA EXCISION  07/28/2010   left foot    TUMOR REMOVAL Right    eye   Family History  Problem Relation Age of Onset   Macular degeneration Mother    Hypertension Mother     Dementia Mother    Atrial fibrillation Mother    Hyperlipidemia Mother    Arthritis Mother    Depression Mother    Hearing loss Mother    Heart disease Mother    Miscarriages / Stillbirths Mother    Diabetes Father    Heart attack Father        x 2   Heart disease Father    Arthritis Father        mostly in back; shoulder   Hyperlipidemia Father    Hypertension Father    Kidney disease Father    Depression Father    Hearing loss Father    Renal Disease Father    Alcohol abuse Sister    Arthritis Sister    Depression Sister    Drug abuse Sister    Hearing loss Sister    Hypertension Sister    Learning disabilities Sister    Other Sister        blood disorder - not hemochromotosis but similar requiring regular phlebotomy   Ovarian cancer Sister 72   Cancer Sister        ovarian   Early death Sister    Alcohol abuse Brother    Deep vein thrombosis Brother        disable and immoble also nephew   Arthritis Brother    Drug abuse Brother    Early death Brother    Hypertension Brother    Heart attack Maternal Grandmother  Early death Maternal Grandmother    Colon cancer Maternal Grandfather 22   Cancer Maternal Grandfather    Early death Maternal Grandfather    Diabetes Paternal Grandmother    Heart attack Paternal Grandmother 63   Early death Paternal Grandmother    Hearing loss Paternal Grandmother    Heart disease Paternal Grandmother    Hypertension Paternal Grandmother    Throat cancer Paternal Grandfather        smoker   Cancer Paternal Grandfather    Depression Paternal Grandfather    Early death Paternal Grandfather    Lung cancer Other    Colon cancer Other    Esophageal cancer Neg Hx    Stomach cancer Neg Hx    Rectal cancer Neg Hx    Social History   Occupational History   Not on file  Tobacco Use   Smoking status: Never   Smokeless tobacco: Never  Vaping Use   Vaping status: Never Used  Substance and Sexual Activity   Alcohol use: Yes     Alcohol/week: 4.0 standard drinks of alcohol    Types: 4 Glasses of wine per week   Drug use: No   Sexual activity: Not Currently    Partners: Male    Birth control/protection: Post-menopausal    Comment: BSO   Tobacco Counseling Counseling given: Not Answered  SDOH Screenings   Food Insecurity: No Food Insecurity (06/09/2024)  Housing: Unknown (06/09/2024)  Transportation Needs: No Transportation Needs (06/09/2024)  Utilities: Not At Risk (06/09/2024)  Alcohol Screen: Low Risk  (10/24/2021)  Depression (PHQ2-9): Low Risk  (06/09/2024)  Financial Resource Strain: Low Risk  (11/10/2022)  Physical Activity: Insufficiently Active (06/09/2024)  Social Connections: Moderately Isolated (06/09/2024)  Stress: No Stress Concern Present (06/09/2024)  Tobacco Use: Low Risk  (06/09/2024)  Health Literacy: Adequate Health Literacy (06/09/2024)   See flowsheets for full screening details  Depression Screen PHQ 2 & 9 Depression Scale- Over the past 2 weeks, how often have you been bothered by any of the following problems? Little interest or pleasure in doing things: 0 Feeling down, depressed, or hopeless (PHQ Adolescent also includes...irritable): 0 PHQ-2 Total Score: 0 Trouble falling or staying asleep, or sleeping too much: 0 Feeling tired or having little energy: 0 Poor appetite or overeating (PHQ Adolescent also includes...weight loss): 0 Feeling bad about yourself - or that you are a failure or have let yourself or your family down: 0 Trouble concentrating on things, such as reading the newspaper or watching television (PHQ Adolescent also includes...like school work): 0 Moving or speaking so slowly that other people could have noticed. Or the opposite - being so fidgety or restless that you have been moving around a lot more than usual: 0 Thoughts that you would be better off dead, or of hurting yourself in some way: 0 PHQ-9 Total Score: 0 If you checked off any problems, how  difficult have these problems made it for you to do your work, take care of things at home, or get along with other people?: Not difficult at all  Depression Treatment Depression Interventions/Treatment : EYV7-0 Score <4 Follow-up Not Indicated     Goals Addressed               This Visit's Progress     Patient Stated (pt-stated)        Exercise more        Visit info / Clinical Intake: Medicare Wellness Visit Type:: Subsequent Annual Wellness Visit Persons participating in visit:: patient  Medicare Wellness Visit Mode:: In-person (required for WTM) Information given by:: patient Interpreter Needed?: No Pre-visit prep was completed: yes AWV questionnaire completed by patient prior to visit?: no Living arrangements:: (!) lives alone Patient's Overall Health Status Rating: good Typical amount of pain: some Does pain affect daily life?: (!) yes (back and left hip pain) Are you currently prescribed opioids?: no  Dietary Habits and Nutritional Risks How many meals a day?: 3 Eats fruit and vegetables daily?: yes Most meals are obtained by: preparing own meals; eating out In the last 2 weeks, have you had any of the following?: (!) nausea, vomiting, diarrhea Diabetic:: (!) yes Any non-healing wounds?: no How often do you check your BS?: 0 Would you like to be referred to a Nutritionist or for Diabetic Management? : no  Functional Status Activities of Daily Living (to include ambulation/medication): Independent Ambulation: Independent with device- listed below Home Assistive Devices/Equipment: Eyeglasses Medication Administration: Independent Home Management: Independent Manage your own finances?: yes Primary transportation is: driving Concerns about vision?: no *vision screening is required for WTM* Concerns about hearing?: no  Fall Screening Falls in the past year?: 1 Number of falls in past year: 1 Was there an injury with Fall?: 1 Fall Risk Category Calculator:  3 Patient Fall Risk Level: High Fall Risk  Fall Risk Patient at Risk for Falls Due to: History of fall(s); Other (Comment) (fainted) Fall risk Follow up: Falls prevention discussed  Home and Transportation Safety: All rugs have non-skid backing?: (!) no All stairs or steps have railings?: N/A, no stairs Grab bars in the bathtub or shower?: (!) no Have non-skid surface in bathtub or shower?: yes Good home lighting?: yes Regular seat belt use?: yes Hospital stays in the last year:: (!) yes How many hospital stays:: 1 Reason: neck  Cognitive Assessment Difficulty concentrating, remembering, or making decisions? : no Will 6CIT or Mini Cog be Completed: no 6CIT or Mini Cog Declined: patient alert, oriented, able to answer questions appropriately and recall recent events  Advance Directives (For Healthcare) Does Patient Have a Medical Advance Directive?: Yes Type of Advance Directive: Healthcare Power of [place]; Living will Copy of Healthcare Power of Attorney in Chart?: Yes - validated most recent copy scanned in chart (See row information) Copy of Living Will in Chart?: Yes - validated most recent copy scanned in chart (See row information)  Reviewed/Updated  Reviewed/Updated: Reviewed All (Medical, Surgical, Family, Medications, Allergies, Care Teams, Patient Goals)        Objective:    Today's Vitals   06/09/24 0800  BP: 138/68  Pulse: 84  Temp: 97.7 F (36.5 C)  SpO2: 95%  Weight: 128 lb (58.1 kg)   Body mass index is 21.97 kg/m.  Current Medications (verified) Outpatient Encounter Medications as of 06/09/2024  Medication Sig   aspirin EC 81 MG tablet Take 81 mg by mouth every evening.    cetirizine (ZYRTEC) 10 MG tablet Take by mouth.   citalopram  (CELEXA ) 20 MG tablet TAKE ONE TABLET BY MOUTH DAILY   doxylamine , Sleep, (UNISOM ) 25 MG tablet Take 25 mg by mouth at bedtime.   estradiol  (CLIMARA  - DOSED IN MG/24 HR) 0.025 mg/24hr patch PLACE 1 PATCH ONTO THE  SKIN ONCE WEEKLY AS DIRECTED   Multiple Vitamin (MULTI-VITAMIN) tablet Take 1 tablet by mouth daily.   progesterone  (PROMETRIUM ) 100 MG capsule Take 1 capsule (100 mg total) by mouth daily.   [DISCONTINUED] diazepam  (DIAZEPAM  INTENSOL) 5 MG/ML solution Take 1 mL (5 mg total) by mouth  every 6 (six) hours as needed for muscle spasms.   [DISCONTINUED] oxyCODONE -acetaminophen  (PERCOCET/ROXICET) 5-325 MG tablet Take 1-2 tablets by mouth every 6 (six) hours as needed for severe pain (pain score 7-10).   No facility-administered encounter medications on file as of 06/09/2024.   Hearing/Vision screen Hearing Screening - Comments:: Pt denies any hearing issue  Vision Screening - Comments:: Wears rx glasses - up to date with routine eye exams with Dr Arlyss King  Immunizations and Health Maintenance Health Maintenance  Topic Date Due   Zoster Vaccines- Shingrix (1 of 2) Never done   Pneumococcal Vaccine: 50+ Years (1 of 1 - PCV) Never done   DTaP/Tdap/Td (3 - Td or Tdap) 11/17/2020   COVID-19 Vaccine (5 - 2025-26 season) 03/28/2024   Medicare Annual Wellness (AWV)  06/09/2025   Mammogram  08/04/2025   Colonoscopy  05/19/2032   Influenza Vaccine  Completed   DEXA SCAN  Completed   Hepatitis C Screening  Completed   Meningococcal B Vaccine  Aged Out        Assessment/Plan:  This is a routine wellness examination for Guys.  Patient Care Team: Wendolyn Jenkins Jansky, MD as PCP - General (Family Medicine) Lonni Slain, MD as PCP - Cardiology (Cardiology) Jannis Kate Norris, MD as Consulting Physician (Obstetrics and Gynecology) Jewell, Jolene R, MD as Consulting Physician (Dermatology)  I have personally reviewed and noted the following in the patient's chart:   Medical and social history Use of alcohol, tobacco or illicit drugs  Current medications and supplements including opioid prescriptions. Functional ability and status Nutritional status Physical activity Advanced  directives List of other physicians Hospitalizations, surgeries, and ER visits in previous 12 months Vitals Screenings to include cognitive, depression, and falls Referrals and appointments  Orders Placed This Encounter  Procedures   Flu vaccine HIGH DOSE PF(Fluzone Trivalent)   In addition, I have reviewed and discussed with patient certain preventive protocols, quality metrics, and best practice recommendations. A written personalized care plan for preventive services as well as general preventive health recommendations were provided to patient.   Ellouise VEAR Haws, LPN   [id number]/82/7974   Return in 1 year (on 06/09/2025).  After Visit Summary: (In Person-Printed) AVS printed and given to the patient  Nurse Notes: pt will make an appt to follow up for fainting episodes

## 2024-06-15 ENCOUNTER — Encounter: Payer: Self-pay | Admitting: Family Medicine

## 2024-06-15 ENCOUNTER — Ambulatory Visit (INDEPENDENT_AMBULATORY_CARE_PROVIDER_SITE_OTHER): Admitting: Family Medicine

## 2024-06-15 VITALS — BP 122/80 | HR 76 | Temp 97.3°F | Ht 64.0 in | Wt 127.5 lb

## 2024-06-15 DIAGNOSIS — R110 Nausea: Secondary | ICD-10-CM

## 2024-06-15 DIAGNOSIS — D225 Melanocytic nevi of trunk: Secondary | ICD-10-CM | POA: Insufficient documentation

## 2024-06-15 DIAGNOSIS — Z86006 Personal history of melanoma in-situ: Secondary | ICD-10-CM | POA: Insufficient documentation

## 2024-06-15 DIAGNOSIS — R7303 Prediabetes: Secondary | ICD-10-CM

## 2024-06-15 DIAGNOSIS — D2330 Other benign neoplasm of skin of unspecified part of face: Secondary | ICD-10-CM | POA: Insufficient documentation

## 2024-06-15 DIAGNOSIS — M546 Pain in thoracic spine: Secondary | ICD-10-CM | POA: Insufficient documentation

## 2024-06-15 DIAGNOSIS — R42 Dizziness and giddiness: Secondary | ICD-10-CM

## 2024-06-15 DIAGNOSIS — L710 Perioral dermatitis: Secondary | ICD-10-CM | POA: Insufficient documentation

## 2024-06-15 LAB — COMPREHENSIVE METABOLIC PANEL WITH GFR
ALT: 7 U/L (ref 0–35)
AST: 17 U/L (ref 0–37)
Albumin: 4.6 g/dL (ref 3.5–5.2)
Alkaline Phosphatase: 70 U/L (ref 39–117)
BUN: 17 mg/dL (ref 6–23)
CO2: 28 meq/L (ref 19–32)
Calcium: 9.3 mg/dL (ref 8.4–10.5)
Chloride: 103 meq/L (ref 96–112)
Creatinine, Ser: 0.88 mg/dL (ref 0.40–1.20)
GFR: 67.35 mL/min (ref >60.00)
Glucose, Bld: 82 mg/dL (ref 70–99)
Potassium: 4.1 meq/L (ref 3.5–5.1)
Sodium: 139 meq/L (ref 135–145)
Total Bilirubin: 0.6 mg/dL (ref 0.2–1.2)
Total Protein: 7 g/dL (ref 6.0–8.3)

## 2024-06-15 LAB — CBC WITH DIFFERENTIAL/PLATELET
Basophils Absolute: 0 K/uL (ref 0.0–0.1)
Basophils Relative: 1.1 % (ref 0.0–3.0)
Eosinophils Absolute: 0.2 K/uL (ref 0.0–0.7)
Eosinophils Relative: 3.7 % (ref 0.0–5.0)
HCT: 41.5 % (ref 36.0–46.0)
Hemoglobin: 14 g/dL (ref 12.0–15.0)
Lymphocytes Relative: 38.1 % (ref 12.0–46.0)
Lymphs Abs: 1.7 K/uL (ref 0.7–4.0)
MCHC: 33.8 g/dL (ref 30.0–36.0)
MCV: 94.7 fl (ref 78.0–100.0)
Monocytes Absolute: 0.5 K/uL (ref 0.1–1.0)
Monocytes Relative: 11.8 % (ref 3.0–12.0)
Neutro Abs: 2 K/uL (ref 1.4–7.7)
Neutrophils Relative %: 45.3 % (ref 43.0–77.0)
Platelets: 257 K/uL (ref 150.0–400.0)
RBC: 4.38 Mil/uL (ref 3.87–5.11)
RDW: 13.2 % (ref 11.5–15.5)
WBC: 4.5 K/uL (ref 4.0–10.5)

## 2024-06-15 LAB — TSH: TSH: 2.01 u[IU]/mL (ref 0.35–5.50)

## 2024-06-15 NOTE — Progress Notes (Signed)
 Subjective:     Patient ID: Kendra Hoffman, female    DOB: 1956/06/16, 68 y.o.   MRN: [id number]  Chief Complaint  Patient presents with   Loss of Consciousness    Pt passed out and is here to discuss happens at night and comes with nausea     Discussed the use of AI scribe software for clinical note transcription with the patient, who gave verbal consent to proceed.  History of Present Illness Kendra Hoffman is a 68 year old female who presents with episodes of nocturnal nausea, sweating, and syncope.  She experiences episodes of nausea, sweating, and lightheadedness primarily at night, sometimes leading to vomiting. Approximately six months ago, she had a syncope episode, waking up on the floor. These episodes have been ongoing for about five years and were initially attributed to alpha-gal syndrome, but have improved since she eliminated beef from her diet. Still eats bacon. Despite this, she still occasionally experiences marginal nausea at night.  These nocturnal episodes occur approximately once a week or longer, but not always resulting in sickness. During these episodes, she does not experience chest pain but sometimes feels her heart racing or hears her heartbeat in her ears. Vomiting occurs during some episodes, but diarrhea is not a predominant symptom. She sometimes takes Alka-Seltzer to alleviate symptoms.  During the day, she experiences episodes she attributes to low blood sugar, characterized by sweating and lightheadedness,  nausea. These symptoms improve with eating and are correlated with not having eaten for a while. She tries to prevent these episodes by eating regularly.  She mentions a past carotid ultrasound due to a dizzy spell and neck pain, which returned clear results. She also recalls a pain down her neck on the left side, which was a new symptom unrelated to her previous neck surgery or rehabilitation.  Her past medical history includes  alpha-gal syndrome diagnosed five years ago, which she manages by avoiding beef and certain gelatin products. She has a history of impaired glucose tolerance with past A1c levels of 5.7% and 5.8%.  She underwent cervical spine surgery recently after her doctor noted on annual imaging that her discs were getting close to the spinal cord, although she herself did not have any symptoms at the time. Post-surgery, she experiences some stiffness and loss of mobility.    There are no preventive care reminders to display for this patient.   Past Medical History:  Diagnosis Date   Allergic reaction to alpha-gal    Amenorrhea    Anemia    history of at age 65   Arthritis    bilateral hands   Breast nodule 09/2006   no mass, patient denies being told about this issue 09/18/2016   Cancer [facility])    melanoma   Cataract    Depression    situational, lost 3 sibling and parents in short period of time   Hx of melanoma of skin 2011   left foot    Mood disorder    PONV (postoperative nausea and vomiting)    Pre-diabetes     Past Surgical History:  Procedure Laterality Date   ANTERIOR CERVICAL DECOMP/DISCECTOMY FUSION N/A 01/18/2024   Procedure: ANTERIOR CERVICAL DECOMPRESSION/DISCECTOMY FUSION CERVICAL FOUR-FIVE, CERVICAL FIVE-SIX, CERVICAL SIX-SEVEN;  Surgeon: Colon Shove, MD;  Location: [facility] OR;  Service: Neurosurgery;  Laterality: N/A;  ACDF R54,R43,R32   BLEPHAROPLASTY Bilateral    BUNIONECTOMY Bilateral    CERVICAL SPINE SURGERY     COLONOSCOPY  DILATATION & CURETTAGE/HYSTEROSCOPY WITH MYOSURE N/A 09/22/2016   Procedure: DILATATION & CURETTAGE/HYSTEROSCOPY WITH MYOSURE (possible Myosure);  Surgeon: Kate Hargis Nearing, MD;  Location: [facility];  Service: Gynecology;  Laterality: N/A;  follow first case   LAPAROSCOPIC BILATERAL SALPINGO OOPHERECTOMY Bilateral 09/04/2015   Procedure: LAPAROSCOPIC BILATERAL SALPINGO OOPHORECTOMY with pelvic washings;  Surgeon: Kate Hargis Nearing, MD;   Location: [facility];  Service: Gynecology;  Laterality: Bilateral;   MELANOMA EXCISION  07/28/2010   left foot    TUMOR REMOVAL Right    eye     Current Outpatient Medications:    aspirin EC 81 MG tablet, Take 81 mg by mouth every evening. , Disp: , Rfl:    cetirizine (ZYRTEC) 10 MG tablet, Take by mouth., Disp: , Rfl:    citalopram  (CELEXA ) 20 MG tablet, TAKE ONE TABLET BY MOUTH DAILY, Disp: 90 tablet, Rfl: 0   doxylamine , Sleep, (UNISOM ) 25 MG tablet, Take 25 mg by mouth at bedtime., Disp: , Rfl:    estradiol  (CLIMARA  - DOSED IN MG/24 HR) 0.025 mg/24hr patch, PLACE 1 PATCH ONTO THE SKIN ONCE WEEKLY AS DIRECTED, Disp: 24 patch, Rfl: 0   Multiple Vitamin (MULTI-VITAMIN) tablet, Take 1 tablet by mouth daily., Disp: , Rfl:    progesterone  (PROMETRIUM ) 100 MG capsule, Take 1 capsule (100 mg total) by mouth daily., Disp: 90 capsule, Rfl: 0  Allergies  Allergen Reactions   Sulfa Antibiotics Other (See Comments)   Alpha-Gal Hives and Rash    GI Distress   Bactrim [Sulfamethoxazole-Trimethoprim] Rash   Sulfamethoxazole Rash   ROS neg/noncontributory except as noted HPI/below      Objective:     BP 122/80 (BP Location: Left Arm, Patient Position: Sitting, Cuff Size: Normal)   Pulse 76   Temp (!) 97.3 F (36.3 C) (Temporal)   Ht 5' 4 (1.626 m)   Wt 127 lb 8 oz (57.8 kg)   LMP 07/28/2008   BMI 21.89 kg/m  Wt Readings from Last 3 Encounters:  06/15/24 127 lb 8 oz (57.8 kg)  06/09/24 128 lb (58.1 kg)  01/18/24 128 lb (58.1 kg)    Physical Exam GENERAL: Well developed, well nourished, no acute distress HEAD EYES EARS NOSE THROAT: Normocephalic atraumatic, conjunctiva not injected, sclera nonicteric CARDIAC: Regular rate and rhythm, S1 S2 present, no murmur, dorsalis pedis 2 plus bilaterally NECK: Supple, no thyromegaly, no nodes, no carotid bruits LUNGS: Clear to auscultation bilaterally, no wheezes ABDOMEN: Bowel sounds present, soft, non tender non distended, no  hepatosplenomegaly, no masses EXTREMITIES: No edema MUSCULOSKELETAL: No gross abnormalities NEUROLOGICAL: Alert and oriented x3, cranial nerves II through XII intact PSYCHIATRIC: Normal mood, good eye contact  Reviewed labs, cardiology studies  I personally spent a total of 45 minutes in the care of the patient today including preparing to see the patient, getting/reviewing separately obtained history, performing a medically appropriate exam/evaluation, counseling and educating, placing orders, documenting clinical information in the EHR, independently interpreting results, and communicating results.     Assessment & Plan:  Prediabetes -     Hemoglobin A1c  Dizziness -     CBC with Differential/Platelet -     Comprehensive metabolic panel with GFR -     TSH -     Hemoglobin A1c  Nausea    Assessment and Plan Assessment & Plan Recurrent nocturnal nausea, dizziness, and suspected hypoglycemic episodes   Episodes occur weekly to biweekly, presenting with nausea, sweating, and lightheadedness, and one syncope episode six months ago. There is no chest  pain or palpitations, and symptoms correlate with dietary intake and fasting. Previous carotid ultrasound and echocardiogram were normal, indicating no cardiac etiology. A continuous glucose monitor is provided for 15 days to assess nocturnal glucose levels. She is instructed to maintain a detailed log of food intake, symptoms, and circumstances surrounding episodes. A protein snack before bed is recommended to prevent nocturnal hypoglycemia. An event monitor for cardiac evaluation is discussed if symptoms persist.  Prediabetes   Previous A1c levels were 5.7 and 5.8. Current symptoms may indicate hypoglycemic episodes, as A1c provides a three-month average and may not capture acute episodes. Blood work is ordered to assess current A1c and other relevant parameters. Glucose levels will be monitored with a continuous glucose monitor to identify  patterns and potential hypoglycemic episodes.  Alpha-gal syndrome (mammalian meat allergy)   Alpha-gal syndrome was diagnosed five years ago. Symptoms include nausea and sweating, potentially related to dietary intake of mammalian meat. She has adjusted her diet to avoid mammalian meat, but occasional symptoms persist, possibly due to cross-reactivity with other meats like pork and does eat bacon. A detailed dietary log will help identify potential triggers related to alpha-gal syndrome. She should continue to avoid mammalian meat and monitor for symptoms related to dietary intake.     Return for as sch in Feb.  Jenkins CHRISTELLA Carrel, MD

## 2024-06-15 NOTE — Patient Instructions (Signed)
 Good logs of events.  Glucose monitor  Have a protein snack before bed.

## 2024-06-16 ENCOUNTER — Ambulatory Visit

## 2024-06-16 ENCOUNTER — Ambulatory Visit: Payer: Self-pay | Admitting: Family Medicine

## 2024-06-16 LAB — HEMOGLOBIN A1C: Hgb A1c MFr Bld: 5.8 % (ref 4.6–6.5)

## 2024-06-16 NOTE — Progress Notes (Signed)
 Patient is in office today for a nurse visit for Medication Education, per PCP's order. Patient was provided with instruction and demonstration on how to Apply CGM sensor and use receiver

## 2024-06-16 NOTE — Progress Notes (Signed)
Labs ok except A1C(3 month average of sugars) is elevated.  This is considered PreDiabetes.  Work on diet-decrease sugars and starches and aim for 30 minutes of exercise 5 days/week to prevent progression to diabetes

## 2024-06-16 NOTE — Progress Notes (Signed)
 Called pt and notified smk

## 2024-06-21 ENCOUNTER — Ambulatory Visit

## 2024-07-13 DIAGNOSIS — M4802 Spinal stenosis, cervical region: Secondary | ICD-10-CM | POA: Diagnosis not present

## 2024-07-14 ENCOUNTER — Ambulatory Visit

## 2024-07-14 ENCOUNTER — Telehealth: Payer: Self-pay

## 2024-07-14 MED ORDER — FREESTYLE LIBRE 3 PLUS SENSOR MISC: 1 refills | Status: AC

## 2024-07-14 NOTE — Progress Notes (Unsigned)
 Patient came in for sensor education and expressed that the office was supposed to provide the sample. After speaking with Dr. Wendolyn as expressed that she was out of samples and was unaware of patient coming back in need of sample. Dr. Wendolyn gave a verbal to send in [place] plus for patient. She also verbalized to have patient call her insurance company to ensure it's covered and what her copay would be. I advised patient of the verbal order. Patient verbalized understanding and expressed that she was not pleased and wished she was notified beforehand and taking a day off to come here for the education. I confirmed pharmacy and sent in [place]. Patient should call the office once herlene is picked up from her pharmacy and schedule her appointment and bring in her own sensor to be educated on how to use and place on her body.

## 2024-07-14 NOTE — Telephone Encounter (Signed)
° °  Patient came in for sensor education and expressed that the office was supposed to provide the sample. After speaking with Dr. Wendolyn as expressed that she was out of samples and was unaware of patient coming back in need of sample. Dr. Wendolyn gave a verbal to send in [place] plus for patient. She also verbalized to have patient call her insurance company to ensure it's covered and what her copay would be. I advised patient of the verbal order. Patient verbalized understanding and expressed that she was not pleased and wished she was notified beforehand and taking a day off to come here for the education. I confirmed pharmacy and sent in [facility]. Patient should call the office once herlene is picked up from her pharmacy and schedule her appointment and bring in her own sensor to be educated on how to use and place on her body.

## 2024-07-26 ENCOUNTER — Ambulatory Visit

## 2024-07-26 DIAGNOSIS — Z719 Counseling, unspecified: Secondary | ICD-10-CM

## 2024-07-26 NOTE — Progress Notes (Signed)
 Patient is in office today for a nurse visit for Free Style Libre 3 placement and App education. Patient was provided with instruction and demonstration on how to Apply CGM sensor and use receiver

## 2024-08-10 ENCOUNTER — Ambulatory Visit

## 2024-08-10 DIAGNOSIS — Z719 Counseling, unspecified: Secondary | ICD-10-CM

## 2024-08-10 NOTE — Progress Notes (Signed)
 Patient is in office today for a nurse visit for Requested to have freestyle libre placed on her arm. Patient was provided with instruction and demonstration on how to Administer Medication libre. Patient failed to complete task herself therefore I placed it on her left arm for her. Sensor was scanned and calibrated and should be ready to use in sixty minutes.

## 2024-08-29 ENCOUNTER — Other Ambulatory Visit (HOSPITAL_BASED_OUTPATIENT_CLINIC_OR_DEPARTMENT_OTHER): Payer: Self-pay

## 2024-08-30 ENCOUNTER — Other Ambulatory Visit (HOSPITAL_BASED_OUTPATIENT_CLINIC_OR_DEPARTMENT_OTHER): Payer: Self-pay

## 2024-08-30 MED ORDER — TRIAMCINOLONE ACETONIDE 0.1 % EX CREA: TOPICAL_CREAM | CUTANEOUS | 3 refills | Status: AC

## 2024-08-30 MED ORDER — CITALOPRAM HYDROBROMIDE 20 MG PO TABS: 20.0000 mg | ORAL_TABLET | Freq: Every day | ORAL | 5 refills | Status: AC

## 2024-08-30 MED ORDER — ESTRADIOL 0.025 MG/24HR TD PTWK: 0.0250 mg | MEDICATED_PATCH | TRANSDERMAL | 2 refills | Status: AC

## 2024-08-30 MED ORDER — PROGESTERONE MICRONIZED 100 MG PO CAPS: 100.0000 mg | ORAL_CAPSULE | Freq: Every day | ORAL | 2 refills | Status: AC

## 2024-08-31 ENCOUNTER — Ambulatory Visit

## 2024-08-31 NOTE — Progress Notes (Signed)
 Patient is in office today for a nurse visit for CGM Training. Patient was provided with instruction and demonstration on how to Apply CGM sensor and use receiver

## 2024-09-02 ENCOUNTER — Telehealth: Payer: Self-pay

## 2024-09-02 DIAGNOSIS — Z719 Counseling, unspecified: Secondary | ICD-10-CM

## 2024-09-02 DIAGNOSIS — R7303 Prediabetes: Secondary | ICD-10-CM

## 2024-09-13 ENCOUNTER — Encounter: Admitting: Family Medicine

## 2025-06-20 ENCOUNTER — Ambulatory Visit
